# Patient Record
Sex: Female | Born: 1965
Health system: Southern US, Community
[De-identification: ages and names within clinical notes are randomized; demographics above are authoritative.]

## PROBLEM LIST (undated history)

## (undated) DIAGNOSIS — T7840XA Allergy, unspecified, initial encounter: Secondary | ICD-10-CM

## (undated) DIAGNOSIS — I10 Essential (primary) hypertension: Secondary | ICD-10-CM

## (undated) DIAGNOSIS — R87811 Vaginal high risk human papillomavirus (HPV) DNA test positive: Secondary | ICD-10-CM

## (undated) DIAGNOSIS — IMO0002 Reserved for concepts with insufficient information to code with codable children: Secondary | ICD-10-CM

## (undated) DIAGNOSIS — J45909 Unspecified asthma, uncomplicated: Secondary | ICD-10-CM

## (undated) DIAGNOSIS — R87620 Atypical squamous cells of undetermined significance on cytologic smear of vagina (ASC-US): Secondary | ICD-10-CM

## (undated) HISTORY — DX: Reserved for concepts with insufficient information to code with codable children: IMO0002

## (undated) HISTORY — DX: Essential (primary) hypertension: I10

## (undated) HISTORY — DX: Allergy, unspecified, initial encounter: T78.40XA

## (undated) HISTORY — DX: Atypical squamous cells of undetermined significance on cytologic smear of vagina (ASC-US): R87.620

## (undated) HISTORY — DX: Vaginal high risk human papillomavirus (HPV) DNA test positive: R87.811

## (undated) HISTORY — PX: OTHER SURGICAL HISTORY: SHX169

## (undated) HISTORY — DX: Unspecified asthma, uncomplicated: J45.909

---

## 1998-04-23 ENCOUNTER — Other Ambulatory Visit: Admission: RE | Admit: 1998-04-23 | Discharge: 1998-04-23 | Payer: Self-pay | Admitting: Obstetrics and Gynecology

## 1999-05-05 ENCOUNTER — Other Ambulatory Visit: Admission: RE | Admit: 1999-05-05 | Discharge: 1999-05-05 | Payer: Self-pay | Admitting: Gynecology

## 2000-05-04 ENCOUNTER — Other Ambulatory Visit: Admission: RE | Admit: 2000-05-04 | Discharge: 2000-05-04 | Payer: Self-pay | Admitting: Gynecology

## 2001-05-05 ENCOUNTER — Other Ambulatory Visit: Admission: RE | Admit: 2001-05-05 | Discharge: 2001-05-05 | Payer: Self-pay | Admitting: Gynecology

## 2002-05-09 ENCOUNTER — Other Ambulatory Visit: Admission: RE | Admit: 2002-05-09 | Discharge: 2002-05-09 | Payer: Self-pay | Admitting: Gynecology

## 2003-05-11 ENCOUNTER — Other Ambulatory Visit: Admission: RE | Admit: 2003-05-11 | Discharge: 2003-05-11 | Payer: Self-pay | Admitting: Gynecology

## 2004-05-26 ENCOUNTER — Other Ambulatory Visit: Admission: RE | Admit: 2004-05-26 | Discharge: 2004-05-26 | Payer: Self-pay | Admitting: Gynecology

## 2004-07-18 ENCOUNTER — Ambulatory Visit: Payer: Self-pay | Admitting: Internal Medicine

## 2005-01-15 ENCOUNTER — Ambulatory Visit: Payer: Self-pay | Admitting: Internal Medicine

## 2005-05-18 ENCOUNTER — Ambulatory Visit: Payer: Self-pay | Admitting: Internal Medicine

## 2005-05-27 ENCOUNTER — Other Ambulatory Visit: Admission: RE | Admit: 2005-05-27 | Discharge: 2005-05-27 | Payer: Self-pay | Admitting: Gynecology

## 2005-07-22 ENCOUNTER — Ambulatory Visit: Payer: Self-pay | Admitting: Internal Medicine

## 2005-09-10 ENCOUNTER — Ambulatory Visit: Payer: Self-pay | Admitting: Internal Medicine

## 2006-01-22 ENCOUNTER — Ambulatory Visit: Payer: Self-pay | Admitting: Internal Medicine

## 2006-05-31 ENCOUNTER — Other Ambulatory Visit: Admission: RE | Admit: 2006-05-31 | Discharge: 2006-05-31 | Payer: Self-pay | Admitting: Gynecology

## 2006-09-24 ENCOUNTER — Ambulatory Visit: Payer: Self-pay | Admitting: Internal Medicine

## 2006-11-02 ENCOUNTER — Ambulatory Visit: Payer: Self-pay | Admitting: Internal Medicine

## 2006-12-14 ENCOUNTER — Other Ambulatory Visit: Admission: RE | Admit: 2006-12-14 | Discharge: 2006-12-14 | Payer: Self-pay | Admitting: Gynecology

## 2007-05-24 DIAGNOSIS — I10 Essential (primary) hypertension: Secondary | ICD-10-CM | POA: Insufficient documentation

## 2007-06-06 ENCOUNTER — Other Ambulatory Visit: Admission: RE | Admit: 2007-06-06 | Discharge: 2007-06-06 | Payer: Self-pay | Admitting: Gynecology

## 2007-07-19 ENCOUNTER — Telehealth: Payer: Self-pay | Admitting: Internal Medicine

## 2007-09-12 ENCOUNTER — Telehealth: Payer: Self-pay | Admitting: Internal Medicine

## 2007-09-12 ENCOUNTER — Ambulatory Visit: Payer: Self-pay | Admitting: Internal Medicine

## 2007-09-12 DIAGNOSIS — J069 Acute upper respiratory infection, unspecified: Secondary | ICD-10-CM | POA: Insufficient documentation

## 2007-09-19 ENCOUNTER — Telehealth: Payer: Self-pay | Admitting: Internal Medicine

## 2007-09-19 ENCOUNTER — Ambulatory Visit: Payer: Self-pay | Admitting: Internal Medicine

## 2007-10-30 ENCOUNTER — Emergency Department (HOSPITAL_COMMUNITY): Admission: EM | Admit: 2007-10-30 | Discharge: 2007-10-30 | Payer: Self-pay | Admitting: Emergency Medicine

## 2008-01-09 ENCOUNTER — Ambulatory Visit: Payer: Self-pay | Admitting: Internal Medicine

## 2008-01-09 DIAGNOSIS — N760 Acute vaginitis: Secondary | ICD-10-CM | POA: Insufficient documentation

## 2008-05-14 ENCOUNTER — Ambulatory Visit: Payer: Self-pay | Admitting: Internal Medicine

## 2008-06-06 ENCOUNTER — Encounter: Payer: Self-pay | Admitting: Gynecology

## 2008-06-06 ENCOUNTER — Ambulatory Visit: Payer: Self-pay | Admitting: Gynecology

## 2008-06-06 ENCOUNTER — Other Ambulatory Visit: Admission: RE | Admit: 2008-06-06 | Discharge: 2008-06-06 | Payer: Self-pay | Admitting: Gynecology

## 2008-11-05 ENCOUNTER — Ambulatory Visit: Payer: Self-pay | Admitting: Internal Medicine

## 2008-12-07 ENCOUNTER — Ambulatory Visit: Payer: Self-pay | Admitting: Internal Medicine

## 2009-02-05 ENCOUNTER — Telehealth: Payer: Self-pay | Admitting: Internal Medicine

## 2009-02-07 ENCOUNTER — Ambulatory Visit: Payer: Self-pay | Admitting: Internal Medicine

## 2009-02-07 DIAGNOSIS — L723 Sebaceous cyst: Secondary | ICD-10-CM | POA: Insufficient documentation

## 2009-06-03 ENCOUNTER — Ambulatory Visit: Payer: Self-pay | Admitting: Internal Medicine

## 2009-06-03 DIAGNOSIS — J309 Allergic rhinitis, unspecified: Secondary | ICD-10-CM | POA: Insufficient documentation

## 2009-06-07 ENCOUNTER — Encounter: Payer: Self-pay | Admitting: Gynecology

## 2009-06-07 ENCOUNTER — Other Ambulatory Visit: Admission: RE | Admit: 2009-06-07 | Discharge: 2009-06-07 | Payer: Self-pay | Admitting: Gynecology

## 2009-06-07 ENCOUNTER — Ambulatory Visit: Payer: Self-pay | Admitting: Gynecology

## 2009-10-24 ENCOUNTER — Ambulatory Visit: Payer: Self-pay | Admitting: Internal Medicine

## 2009-11-29 ENCOUNTER — Ambulatory Visit: Payer: Self-pay | Admitting: Internal Medicine

## 2009-11-29 LAB — CONVERTED CEMR LAB
ALT: 17 units/L (ref 0–35)
BUN: 9 mg/dL (ref 6–23)
Bilirubin Urine: NEGATIVE
Bilirubin, Direct: 0 mg/dL (ref 0.0–0.3)
CO2: 30 meq/L (ref 19–32)
Chloride: 104 meq/L (ref 96–112)
Cholesterol: 237 mg/dL — ABNORMAL HIGH (ref 0–200)
Creatinine, Ser: 0.9 mg/dL (ref 0.4–1.2)
Direct LDL: 179.1 mg/dL
Eosinophils Absolute: 0.1 10*3/uL (ref 0.0–0.7)
Glucose, Bld: 83 mg/dL (ref 70–99)
Glucose, Urine, Semiquant: NEGATIVE
HCT: 35.7 % — ABNORMAL LOW (ref 36.0–46.0)
Lymphs Abs: 1.4 10*3/uL (ref 0.7–4.0)
MCHC: 34.2 g/dL (ref 30.0–36.0)
MCV: 90.1 fL (ref 78.0–100.0)
Monocytes Absolute: 0.4 10*3/uL (ref 0.1–1.0)
Neutrophils Relative %: 60.5 % (ref 43.0–77.0)
Platelets: 259 10*3/uL (ref 150.0–400.0)
Potassium: 3.7 meq/L (ref 3.5–5.1)
Protein, U semiquant: NEGATIVE
RDW: 13.7 % (ref 11.5–14.6)
Specific Gravity, Urine: 1.015
TSH: 2.04 microintl units/mL (ref 0.35–5.50)
Total Bilirubin: 0.3 mg/dL (ref 0.3–1.2)
VLDL: 16.4 mg/dL (ref 0.0–40.0)
WBC Urine, dipstick: NEGATIVE
pH: 7

## 2009-12-06 ENCOUNTER — Ambulatory Visit: Payer: Self-pay | Admitting: Internal Medicine

## 2009-12-06 DIAGNOSIS — E785 Hyperlipidemia, unspecified: Secondary | ICD-10-CM | POA: Insufficient documentation

## 2010-06-17 ENCOUNTER — Other Ambulatory Visit: Admission: RE | Admit: 2010-06-17 | Discharge: 2010-06-17 | Payer: Self-pay | Admitting: Gynecology

## 2010-06-17 ENCOUNTER — Ambulatory Visit: Payer: Self-pay | Admitting: Gynecology

## 2010-07-01 ENCOUNTER — Ambulatory Visit: Payer: Self-pay | Admitting: Internal Medicine

## 2010-07-28 ENCOUNTER — Telehealth: Payer: Self-pay | Admitting: Internal Medicine

## 2010-07-30 ENCOUNTER — Ambulatory Visit: Payer: Self-pay | Admitting: Internal Medicine

## 2010-07-30 DIAGNOSIS — K625 Hemorrhage of anus and rectum: Secondary | ICD-10-CM | POA: Insufficient documentation

## 2010-09-25 NOTE — Letter (Signed)
Summary: Out of Work  Adult nurse at [employer]  [address]   [place], [place] [address]   Phone: [phone]  Fax: [phone]    July 01, 2010   Employee:  Catherine Campbell    To Whom It May Concern:   For Medical reasons, please excuse the above named employee from work for the following dates:  Start:   06-29-10  End:   07-03-10 if improved  If you need additional information, please feel free to contact our office.         Sincerely,    Gordy Savers  MD

## 2010-09-25 NOTE — Assessment & Plan Note (Signed)
Summary: CPX/NO PAP/NJR   Vital Signs:  Patient profile:   45 year old female Height:      65 inches Weight:      212 pounds BMI:     35.41 Temp:     98.7 degrees F oral BP sitting:   120 / 70  (right arm) Cuff size:   regular  Vitals Entered By: Duard Brady LPN (December 06, 2009 3:10 PM) CC: cpx - labs done - pap and mammo 05/2009 both normal Is Patient Diabetic? No   CC:  cpx - labs done - pap and mammo 05/2009 both normal.  History of Present Illness: 45 year old patient who is in today for a  health maintenance examination.  she is followed by gynecology.  She is doing quite well today without concerns or complaints.  She has a history treated hypertension and allergic rhinitis.  For the past month or two.  She has had some residual cough following a URI.  She is on Altace  for blood pressure control  Allergies: 1)  ! Pcn 2)  ! * Ultracet 3)  ! * Skelaxin  Past History:  Past Medical History: Hypertension Allergic rhinitis Hyperlipidemia  Past Surgical History: none drainage L axillary adenitis   Family History: Reviewed history from 09/12/2007 and no changes required.  father died age 66  history hypertension, type 2 diabetes, throat Ca- died complication of pneumonia, colonic polyps mother history of hypertension  no siblings  No family history of cancer  Social History: Reviewed history from 09/12/2007 and no changes required.  graduate, [employer]  Review of Systems       The patient complains of prolonged cough.  The patient denies anorexia, fever, weight loss, weight gain, vision loss, decreased hearing, hoarseness, chest pain, syncope, dyspnea on exertion, peripheral edema, headaches, hemoptysis, abdominal pain, melena, hematochezia, severe indigestion/heartburn, hematuria, incontinence, genital sores, muscle weakness, suspicious skin lesions, transient blindness, difficulty walking, depression, unusual weight change, abnormal  bleeding, enlarged lymph nodes, angioedema, and breast masses.    Physical Exam  General:  overweight-appearing.  130/74overweight-appearing.   Head:  Normocephalic and atraumatic without obvious abnormalities. No apparent alopecia or balding. Eyes:  No corneal or conjunctival inflammation noted. EOMI. Perrla. Funduscopic exam benign, without hemorrhages, exudates or papilledema. Vision grossly normal. Ears:  External ear exam shows no significant lesions or deformities.  Otoscopic examination reveals clear canals, tympanic membranes are intact bilaterally without bulging, retraction, inflammation or discharge. Hearing is grossly normal bilaterally. Nose:  External nasal examination shows no deformity or inflammation. Nasal mucosa are pink and moist without lesions or exudates. Mouth:  Oral mucosa and oropharynx without lesions or exudates.  Teeth in good repair. Neck:  No deformities, masses, or tenderness noted. Chest Wall:  No deformities, masses, or tenderness noted. Lungs:  Normal respiratory effort, chest expands symmetrically. Lungs are clear to auscultation, no crackles or wheezes. Heart:  Normal rate and regular rhythm. S1 and S2 normal without gallop, murmur, click, rub or other extra sounds. Abdomen:  Bowel sounds positive,abdomen soft and non-tender without masses, organomegaly or hernias noted. Msk:  No deformity or scoliosis noted of thoracic or lumbar spine.   Pulses:  R and L carotid,radial,femoral,dorsalis pedis and posterior tibial pulses are full and equal bilaterally Extremities:  No clubbing, cyanosis, edema, or deformity noted with normal full range of motion of all joints.   Neurologic:  No cranial nerve deficits noted. Station and gait are normal. Plantar reflexes are down-going bilaterally. DTRs are symmetrical  throughout. Sensory, motor and coordinative functions appear intact. Skin:  Intact without suspicious lesions or rashes Cervical Nodes:  No lymphadenopathy  noted Axillary Nodes:  No palpable lymphadenopathy Inguinal Nodes:  No significant adenopathy Psych:  Cognition and judgment appear intact. Alert and cooperative with normal attention span and concentration. No apparent delusions, illusions, hallucinations   Impression & Recommendations:  Problem # 1:  Preventive Health Care (ICD-V70.0)  Complete Medication List: 1)  Altace 5 Mg Tabs (Ramipril) .Marland Kitchen.. 1 once daily 2)  Hydrocodone-homatropine 5-1.5 Mg/6ml Syrp (Hydrocodone-homatropine) .Marland Kitchen.. 1 teaspoon every 6 hours as needed for cough  Patient Instructions: 1)  Please schedule a follow-up appointment in 1 year. 2)  Limit your Sodium (Salt). 3)  It is important that you exercise regularly at least 20 minutes 5 times a week. If you develop chest pain, have severe difficulty breathing, or feel very tired , stop exercising immediately and seek medical attention. 4)  You need to lose weight. Consider a lower calorie diet and regular exercise.  5)  Check your Blood Pressure regularly. If it is above:150/90  you should make an appointment. Prescriptions: ALTACE 5 MG  TABS (RAMIPRIL) 1 once daily  #90 x 6   Entered and Authorized by:   Gordy Savers  MD   Signed by:   Gordy Savers  MD on 12/06/2009   Method used:   Electronically to        [facility] # [phone]* (retail)       [address] [place], [place]  [address]       Ph: [phone]       Fax: [phone]   RxID:   [id number]

## 2010-09-25 NOTE — Assessment & Plan Note (Signed)
Summary: SINUSITIS?, LG FEVER // RS   Vital Signs:  Patient profile:   45 year old female Weight:      211 pounds Temp:     98.7 degrees F oral BP sitting:   130 / 74  (right arm) Cuff size:   large  Vitals Entered By: Duard Brady LPN (October 24, 4780 11:24 AM) CC: c/o nasal congestion, dry cough , otc not helpings Is Patient Diabetic? No   CC:  c/o nasal congestion, dry cough , and otc not helpings.  History of Present Illness: 45 year old patient who is seen today with a week history of nasal congestion and cough.  Her chief complaint is hoarseness.  She has been using Claritin and some over-the-counter medicines without much benefit.  She has treated hypertension, which has not well controlled and also a history of allergic rhinitis.  Denies any fever or purulent, sputum production.  Denies any shortness of breath or chest pain  Preventive Screening-Counseling & Management  Alcohol-Tobacco     Smoking Status: never  Allergies: 1)  ! Pcn 2)  ! * Ultracet 3)  ! * Skelaxin  Past History:  Past Medical History: Reviewed history from 06/03/2009 and no changes required. Hypertension Allergic rhinitis  Review of Systems       The patient complains of anorexia, hoarseness, and prolonged cough.  The patient denies fever, weight loss, weight gain, vision loss, decreased hearing, chest pain, syncope, dyspnea on exertion, peripheral edema, headaches, hemoptysis, abdominal pain, melena, hematochezia, severe indigestion/heartburn, hematuria, incontinence, genital sores, muscle weakness, suspicious skin lesions, transient blindness, difficulty walking, depression, unusual weight change, abnormal bleeding, enlarged lymph nodes, angioedema, and breast masses.    Physical Exam  General:  overweight-appearing.  normal blood pressure, no distress Head:  Normocephalic and atraumatic without obvious abnormalities. No apparent alopecia or balding. Eyes:  No corneal or conjunctival  inflammation noted. EOMI. Perrla. Funduscopic exam benign, without hemorrhages, exudates or papilledema. Vision grossly normal. Ears:  External ear exam shows no significant lesions or deformities.  Otoscopic examination reveals clear canals, tympanic membranes are intact bilaterally without bulging, retraction, inflammation or discharge. Hearing is grossly normal bilaterally. Nose:  External nasal examination shows no deformity or inflammation. Nasal mucosa are pink and moist without lesions or exudates. Mouth:  Oral mucosa and oropharynx without lesions or exudates.  Teeth in good repair. Neck:  No deformities, masses, or tenderness noted. Lungs:  Normal respiratory effort, chest expands symmetrically. Lungs are clear to auscultation, no crackles or wheezes. Heart:  Normal rate and regular rhythm. S1 and S2 normal without gallop, murmur, click, rub or other extra sounds. Abdomen:  Bowel sounds positive,abdomen soft and non-tender without masses, organomegaly or hernias noted. Msk:  No deformity or scoliosis noted of thoracic or lumbar spine.   Pulses:  R and L carotid,radial,femoral,dorsalis pedis and posterior tibial pulses are full and equal bilaterally Extremities:  No clubbing, cyanosis, edema, or deformity noted with normal full range of motion of all joints.     Impression & Recommendations:  Problem # 1:  ALLERGIC RHINITIS (ICD-477.9)  Her updated medication list for this problem includes:    Clarinex 5 Mg Tabs (Desloratadine) ..... One by mouth daily    Omnaris 50 Mcg/act Susp (Ciclesonide) .Marland Kitchen... 2 sprays in each nostril once daily  Her updated medication list for this problem includes:    Clarinex 5 Mg Tabs (Desloratadine) ..... One by mouth daily    Omnaris 50 Mcg/act Susp (Ciclesonide) .Marland Kitchen... 2 sprays  in each nostril once daily  Problem # 2:  URI (ICD-465.9)  Her updated medication list for this problem includes:    Hydrocodone-homatropine 5-1.5 Mg/53ml Syrp  (Hydrocodone-homatropine) .Marland Kitchen... 1 teaspoon every 6 hours as needed for cough    Clarinex 5 Mg Tabs (Desloratadine) ..... One by mouth daily  Her updated medication list for this problem includes:    Hydrocodone-homatropine 5-1.5 Mg/57ml Syrp (Hydrocodone-homatropine) .Marland Kitchen... 1 teaspoon every 6 hours as needed for cough    Clarinex 5 Mg Tabs (Desloratadine) ..... One by mouth daily  Problem # 3:  HYPERTENSION (ICD-401.9)  Her updated medication list for this problem includes:    Altace 5 Mg Tabs (Ramipril) .Marland Kitchen... 1 once daily  Her updated medication list for this problem includes:    Altace 5 Mg Tabs (Ramipril) .Marland Kitchen... 1 once daily  Complete Medication List: 1)  Altace 5 Mg Tabs (Ramipril) .Marland Kitchen.. 1 once daily 2)  Hydrocodone-homatropine 5-1.5 Mg/87ml Syrp (Hydrocodone-homatropine) .Marland Kitchen.. 1 teaspoon every 6 hours as needed for cough 3)  Clarinex 5 Mg Tabs (Desloratadine) .... One by mouth daily 4)  Omnaris 50 Mcg/act Susp (Ciclesonide) .... 2 sprays in each nostril once daily  Patient Instructions: 1)  Get plenty of rest, drink lots of clear liquids, and use Tylenol or Ibuprofen for fever and comfort. Return in 7-10 days if you're not better:sooner if you're feeling worse. 2)  arthrotec one twice daily  3)  Limit your Sodium (Salt) to less than 2 grams a day(slightly less than 1/2 a teaspoon) to prevent fluid retention, swelling, or worsening of symptoms. 4)  It is important that you exercise regularly at least 20 minutes 5 times a week. If you develop chest pain, have severe difficulty breathing, or feel very tired , stop exercising immediately and seek medical attention. 5)  You need to lose weight. Consider a lower calorie diet and regular exercise.  Prescriptions: HYDROCODONE-HOMATROPINE 5-1.5 MG/5ML SYRP (HYDROCODONE-HOMATROPINE) 1 teaspoon every 6 hours as needed for cough  #6 oz x 2   Entered and Authorized by:   Gordy Savers  MD   Signed by:   Gordy Savers  MD on 10/24/2009    Method used:   Print then Give to Patient   RxID:   [id number]

## 2010-09-25 NOTE — Assessment & Plan Note (Signed)
Summary: bleeding from bowels/dm   Vital Signs:  Patient profile:   45 year old female Weight:      213 pounds Temp:     98.4 degrees F oral BP sitting:   126 / 78  (left arm) Cuff size:   regular  Vitals Entered By: Duard Brady LPN (July 30, 2010 10:33 AM) CC: c/o blood noted with Bms while using vivimo Is Patient Diabetic? No   CC:  c/o blood noted with Bms while using vivimo.  History of Present Illness: 45 year old patient who is seen today for follow-up.  She was seen last month for URI and was treated briefly with Vimovo.  In the past few days.  She has had some low-volume bright red rectal bleeding with her normal daily bowel movement only.  This morning, however, was her first BM without any significant blood.  No significant pain although has noted some mild discomfort with her daily bowel movement  Allergies: 1)  ! Pcn 2)  ! * Ultracet 3)  ! * Skelaxin  Past History:  Past Medical History: Reviewed history from 12/06/2009 and no changes required. Hypertension Allergic rhinitis Hyperlipidemia  Review of Systems       The patient complains of hematochezia.  The patient denies anorexia, fever, weight loss, weight gain, vision loss, decreased hearing, hoarseness, chest pain, syncope, dyspnea on exertion, peripheral edema, prolonged cough, headaches, hemoptysis, abdominal pain, melena, severe indigestion/heartburn, hematuria, incontinence, genital sores, muscle weakness, suspicious skin lesions, transient blindness, difficulty walking, depression, unusual weight change, abnormal bleeding, enlarged lymph nodes, angioedema, and breast masses.    Physical Exam  General:  Well-developed,well-nourished,in no acute distress; alert,appropriate and cooperative throughout examination Abdomen:  Bowel sounds positive,abdomen soft and non-tender without masses, organomegaly or hernias noted. Rectal:  smaller hemorrhoidal tag at the clock position.  Internal exam normal;   stool is negative for occult blood   Impression & Recommendations:  Problem # 1:  RECTAL BLEEDING (ICD-569.3)  Complete Medication List: 1)  Altace 5 Mg Tabs (Ramipril) .Marland Kitchen.. 1 once daily 2)  Hydrocodone-homatropine 5-1.5 Mg/37ml Syrp (Hydrocodone-homatropine) .Marland Kitchen.. 1 teaspoon every 6 hours as needed for cough 3)  Proctocare-hc 2.5 % Crea (Hydrocortisone) .... Apply every 6 hours as needed  Patient Instructions: 1)  Please schedule a follow-up appointment as needed. 2)  call if bleeding recurs or intensifies Prescriptions: PROCTOCARE-HC 2.5 % CREA (HYDROCORTISONE) apply every 6 hours as needed  #30 gm x 3   Entered and Authorized by:   Gordy Savers  MD   Signed by:   Gordy Savers  MD on 07/30/2010   Method used:   Print then Give to Patient   RxID:   [id number]    Orders Added: 1)  Est. Patient Level III [[address]]

## 2010-09-25 NOTE — Assessment & Plan Note (Signed)
Summary: COUGH, CONGESTION - LARYNGITIS // RS   Vital Signs:  Patient profile:   45 year old female Weight:      212 pounds Temp:     99.2 degrees F oral BP sitting:   130 / 82  (right arm) Cuff size:   regular  Vitals Entered By: Duard Brady LPN (July 01, 2010 9:08 AM) CC: c/o dry,tight cough x 2 wks , hoarseness Is Patient Diabetic? No   CC:  c/o dry, tight cough x 2 wks , and hoarseness.  History of Present Illness: 45 year old patient, who presents with a 4-day history of nonproductive cough.  She has had low-grade fever and hoarseness.  Denies any chest pain or shortness of breath.  She does have a history of treated hypertension on ACE inhibition.  No history of prolonged cough.  Denies any sputum production.  She has a history of mild dyslipidemia with a high HDL controlled with diet  Allergies: 1)  ! Pcn 2)  ! * Ultracet 3)  ! * Skelaxin  Past History:  Past Medical History: Reviewed history from 12/06/2009 and no changes required. Hypertension Allergic rhinitis Hyperlipidemia  Family History: Reviewed history from 12/06/2009 and no changes required.  father died age 79  history hypertension, type 2 diabetes, throat Ca- died complication of pneumonia, colonic polyps mother history of hypertension  no siblings  No family history of cancer  Review of Systems       The patient complains of anorexia, fever, hoarseness, and prolonged cough.  The patient denies weight loss, weight gain, vision loss, decreased hearing, chest pain, syncope, dyspnea on exertion, peripheral edema, headaches, hemoptysis, abdominal pain, melena, hematochezia, severe indigestion/heartburn, hematuria, incontinence, genital sores, muscle weakness, suspicious skin lesions, transient blindness, difficulty walking, depression, unusual weight change, abnormal bleeding, enlarged lymph nodes, angioedema, and breast masses.    Physical Exam  General:  overweight-appearing. 140/90, lower  on arrival; courseoverweight-appearing.   Head:  Normocephalic and atraumatic without obvious abnormalities. No apparent alopecia or balding. Eyes:  No corneal or conjunctival inflammation noted. EOMI. Perrla. Funduscopic exam benign, without hemorrhages, exudates or papilledema. Vision grossly normal. Ears:  External ear exam shows no significant lesions or deformities.  Otoscopic examination reveals clear canals, tympanic membranes are intact bilaterally without bulging, retraction, inflammation or discharge. Hearing is grossly normal bilaterally. Nose:  External nasal examination shows no deformity or inflammation. Nasal mucosa are pink and moist without lesions or exudates. Mouth:  Oral mucosa and oropharynx without lesions or exudates.  Teeth in good repair. Neck:  No deformities, masses, or tenderness noted. Chest Wall:  No deformities, masses, or tenderness noted. Lungs:  Normal respiratory effort, chest expands symmetrically. Lungs are clear to auscultation, no crackles or wheezes.   Impression & Recommendations:  Problem # 1:  URI (ICD-465.9)  Her updated medication list for this problem includes:    Hydrocodone-homatropine 5-1.5 Mg/39ml Syrp (Hydrocodone-homatropine) .Marland Kitchen... 1 teaspoon every 6 hours as needed for cough  Her updated medication list for this problem includes:    Hydrocodone-homatropine 5-1.5 Mg/79ml Syrp (Hydrocodone-homatropine) .Marland Kitchen... 1 teaspoon every 6 hours as needed for cough  Problem # 2:  HYPERTENSION (ICD-401.9)  Her updated medication list for this problem includes:    Altace 5 Mg Tabs (Ramipril) .Marland Kitchen... 1 once daily  Her updated medication list for this problem includes:    Altace 5 Mg Tabs (Ramipril) .Marland Kitchen... 1 once daily  Complete Medication List: 1)  Altace 5 Mg Tabs (Ramipril) .Marland Kitchen.. 1 once daily 2)  Hydrocodone-homatropine 5-1.5 Mg/60ml Syrp (Hydrocodone-homatropine) .Marland Kitchen.. 1 teaspoon every 6 hours as needed for cough  Patient Instructions: 1)  Get plenty  of rest, drink lots of clear liquids, and use Tylenol  for fever and comfort. Return in 7-10 days if you're not better:sooner if you're feeling worse. 2)  Vimovo- one twice daily 3)  Substitue benicar for altace 4)  Add Delsym or Mucinex DM for cough 5)  Please schedule a follow-up appointment in 6 months for Annual exam 6)  Limit your Sodium (Salt). Prescriptions: HYDROCODONE-HOMATROPINE 5-1.5 MG/5ML SYRP (HYDROCODONE-HOMATROPINE) 1 teaspoon every 6 hours as needed for cough  #6 oz x 2   Entered and Authorized by:   Gordy Savers  MD   Signed by:   Gordy Savers  MD on 07/01/2010   Method used:   Print then Give to Patient   RxID:   [id number] ALTACE 5 MG  TABS (RAMIPRIL) 1 once daily  #90 x 6   Entered and Authorized by:   Gordy Savers  MD   Signed by:   Gordy Savers  MD on 07/01/2010   Method used:   Print then Give to Patient   RxID:   [id number]    Orders Added: 1)  Est. Patient Level III [84696]

## 2010-09-25 NOTE — Progress Notes (Signed)
Summary: blood in stool  Phone Note Call from Patient Call back at Work Phone [phone]   Caller: Patient Call For: Gordy Savers  MD Summary of Call: Vimovo has caused pt to have blood in her stool?????  No pain, but did not start until she started this med. Initial call taken by: Lynann Beaver CMA AAMA,  July 28, 2010 11:19 AM  Follow-up for Phone Call        d/c vimovo- melena?   [facility] ?  needs rov unless this is scanty [facility] Follow-up by: Gordy Savers  MD,  July 28, 2010 12:24 PM  Additional Follow-up for Phone Call Additional follow up Details #1::        [facility] Additional Follow-up by: [facility] CMA AAMA,  July 28, 2010 12:29 PM    Additional Follow-up for Phone Call Additional follow up Details #2::    Made appt for tomorrow. Follow-up by: Lynann Beaver CMA AAMA,  July 28, 2010 1:45 PM

## 2010-10-17 ENCOUNTER — Encounter: Payer: Self-pay | Admitting: Internal Medicine

## 2010-10-17 ENCOUNTER — Ambulatory Visit (INDEPENDENT_AMBULATORY_CARE_PROVIDER_SITE_OTHER): Payer: Federal, State, Local not specified - PPO | Admitting: Internal Medicine

## 2010-10-17 DIAGNOSIS — J040 Acute laryngitis: Secondary | ICD-10-CM

## 2010-10-17 MED ORDER — AZITHROMYCIN 250 MG PO TABS: ORAL_TABLET | ORAL | Status: AC

## 2010-10-17 NOTE — Assessment & Plan Note (Signed)
Suspect current viral etiology. Recommend otc sx tx prn and salt water gargles. Provide abx prescription to hold. Begin abx if sx's do not improve after total duration of at least 8-10 days. Followup if no improvement or worsening.

## 2010-10-17 NOTE — Progress Notes (Signed)
Subjective:    Patient ID: Catherine Campbell, female    DOB: 12-24-1965, 45 y.o.   MRN: [id number]  HPI Pt presents to clinic for evaluation of laryngitis. Notes 4d h/o nasal congestion, nasal drainage, mild cough and laryngitis. Attempted otc cold medication without improvement. No alleviating or exacerbating factors. Notes have suffered from 5-6 episodes of laryngitis since November all seemingly associated with URI's.  Reviewed PMH, medications, and allergies.    Review of Systems  Constitutional: Negative for fever and chills.  HENT: Positive for congestion and rhinorrhea. Negative for ear pain.   Eyes: Negative for discharge and redness.  Respiratory: Positive for cough. Negative for shortness of breath and wheezing.   Skin: Negative for color change and rash.       Objective:   Physical Exam  Nursing note and vitals reviewed. Constitutional: She appears well-developed and well-nourished.  HENT:  Head: Normocephalic and atraumatic.  Right Ear: Tympanic membrane, external ear and ear canal normal.  Left Ear: Tympanic membrane, external ear and ear canal normal.  Nose: Nose normal.  Mouth/Throat: Oropharynx is clear and moist. No oropharyngeal exudate.  Eyes: Conjunctivae are normal. Right eye exhibits no discharge. Left eye exhibits no discharge. No scleral icterus.  Neck: Neck supple.  Cardiovascular: Normal rate, regular rhythm and normal heart sounds.   Pulmonary/Chest: Effort normal and breath sounds normal. No respiratory distress. She has no wheezes. She has no rales.  Lymphadenopathy:    She has no cervical adenopathy.  Neurological: She is alert.  Skin: Skin is warm and dry.          Assessment & Plan:

## 2010-10-20 ENCOUNTER — Telehealth: Payer: Self-pay | Admitting: *Deleted

## 2010-10-20 DIAGNOSIS — J040 Acute laryngitis: Secondary | ICD-10-CM

## 2010-10-20 NOTE — Telephone Encounter (Signed)
Okay to refer her to allergy 

## 2010-10-20 NOTE — Telephone Encounter (Signed)
Okay to refer her to allergy

## 2010-10-20 NOTE — Telephone Encounter (Signed)
Mother is asking for referral for laryngitis for daughter, please.

## 2010-10-21 NOTE — Telephone Encounter (Signed)
Addended by: Duard Brady on: 10/21/2010 12:39 PM   Modules accepted: Orders

## 2010-10-21 NOTE — Telephone Encounter (Signed)
Attempt to call - ans mach - left msg that terri will be sceduling appt with allergist and will call once that is set up. Order done. KIK

## 2011-05-18 LAB — RAPID STREP SCREEN (MED CTR MEBANE ONLY): Streptococcus, Group A Screen (Direct): NEGATIVE

## 2011-06-19 ENCOUNTER — Encounter: Payer: Federal, State, Local not specified - PPO | Admitting: Gynecology

## 2011-07-03 ENCOUNTER — Ambulatory Visit (INDEPENDENT_AMBULATORY_CARE_PROVIDER_SITE_OTHER): Payer: Federal, State, Local not specified - PPO | Admitting: Gynecology

## 2011-07-03 ENCOUNTER — Encounter: Payer: Self-pay | Admitting: Gynecology

## 2011-07-03 VITALS — BP 124/78 | Ht 65.5 in | Wt 209.0 lb

## 2011-07-03 DIAGNOSIS — B3731 Acute candidiasis of vulva and vagina: Secondary | ICD-10-CM

## 2011-07-03 DIAGNOSIS — N760 Acute vaginitis: Secondary | ICD-10-CM

## 2011-07-03 DIAGNOSIS — M545 Low back pain, unspecified: Secondary | ICD-10-CM

## 2011-07-03 DIAGNOSIS — B373 Candidiasis of vulva and vagina: Secondary | ICD-10-CM

## 2011-07-03 DIAGNOSIS — A499 Bacterial infection, unspecified: Secondary | ICD-10-CM

## 2011-07-03 DIAGNOSIS — R82998 Other abnormal findings in urine: Secondary | ICD-10-CM

## 2011-07-03 DIAGNOSIS — Z01419 Encounter for gynecological examination (general) (routine) without abnormal findings: Secondary | ICD-10-CM

## 2011-07-03 DIAGNOSIS — B9689 Other specified bacterial agents as the cause of diseases classified elsewhere: Secondary | ICD-10-CM

## 2011-07-03 MED ORDER — TERCONAZOLE 0.8 % VA CREA: 1.0000 | TOPICAL_CREAM | Freq: Every day | VAGINAL | Status: AC

## 2011-07-03 MED ORDER — METRONIDAZOLE 500 MG PO TABS: 500.0000 mg | ORAL_TABLET | Freq: Two times a day (BID) | ORAL | Status: AC

## 2011-07-03 NOTE — Progress Notes (Signed)
Catherine Campbell 12/09/1965 [id number]        45 y.o.  for annual exam.  Overall doing well. She did asked to have a wet prep check for yeast and she's having a little bit of discharge and itching.  Past medical history,surgical history, medications, allergies, family history and social history were all reviewed and documented in the EPIC chart. ROS:  Was performed and pertinent positives and negatives are included in the history.  Exam: chaperone present Filed Vitals:   07/03/11 1458  BP: 124/78   General appearance  Normal Skin grossly normal Head/Neck normal with no cervical or supraclavicular adenopathy thyroid normal Lungs  clear Cardiac RR, without RMG Abdominal  soft, nontender, without masses, organomegaly or hernia Breasts  examined lying and sitting without masses, retractions, discharge or axillary adenopathy. Pelvic  Ext/BUS/vagina  normal with slight white discharge KOH were prep done  Cervix  normal    Uterus  retroverted, normal size, shape and contour, midline and mobile nontender   Adnexa  Without masses or tenderness    Anus and perineum  normal   Rectovaginal  normal sphincter tone without palpated masses or tenderness.    Assessment/Plan:  45 y.o. female for annual exam.    1. White discharge. Wet prep is positive for yeast and BV.  We'll treat with Terazol 3 day cream and Flagyl 500 twice a day x7 days supple avoidance discussed follow up if symptoms persist or recur. UA is low-grade positive will wait for culture results and treat accordingly. 2. Health maintenance. SBE monthly reviewed. Has mammogram scheduled later this month she will follow up for this. Pap was not done. She has no history of abnormal Pap smears in the past and documented normal results in her chart. We'll plan every 3 year screen she agrees with this. No blood work was done today as it is all done through her primary who follows her for her hypertension. Contraception is not an issue as she is  not sexually active and does not anticipate to be any time soon. Assuming patient continues well from a gynecologic standpoint she will see me in a year sooner as needed.    [name], 4:17 PM 07/03/2011

## 2011-07-06 MED ORDER — SULFAMETHOXAZOLE-TMP DS 800-160 MG PO TABS: 1.0000 | ORAL_TABLET | Freq: Two times a day (BID) | ORAL | Status: AC

## 2011-07-06 NOTE — Progress Notes (Signed)
Addended by: Dara Lords on: 07/06/2011 02:06 PM   Modules accepted: Orders

## 2011-07-20 ENCOUNTER — Encounter: Payer: Self-pay | Admitting: Gynecology

## 2011-08-07 ENCOUNTER — Ambulatory Visit (INDEPENDENT_AMBULATORY_CARE_PROVIDER_SITE_OTHER): Payer: Federal, State, Local not specified - PPO | Admitting: Internal Medicine

## 2011-08-07 ENCOUNTER — Encounter: Payer: Self-pay | Admitting: Internal Medicine

## 2011-08-07 DIAGNOSIS — I10 Essential (primary) hypertension: Secondary | ICD-10-CM

## 2011-08-07 MED ORDER — RAMIPRIL 5 MG PO CAPS: 5.0000 mg | ORAL_CAPSULE | Freq: Every day | ORAL | Status: DC

## 2011-08-07 NOTE — Progress Notes (Signed)
Subjective:    Patient ID: Catherine Campbell, female    DOB: 05-Aug-1966, 45 y.o.   MRN: [id number]  HPI  Wt Readings from Last 3 Encounters:  08/07/11 211 lb (95.709 kg)  07/03/11 209 lb (94.802 kg)  10/17/10 213 lb (96.616 kg)      Review of Systems     Objective:   Physical Exam        Assessment & Plan:

## 2011-08-07 NOTE — Patient Instructions (Signed)
Limit your sodium (Salt) intake    It is important that you exercise regularly, at least 20 minutes 3 to 4 times per week.  If you develop chest pain or shortness of breath seek  medical attention.  You need to lose weight.  Consider a lower calorie diet and regular exercise.  Return in one year for follow-up 

## 2011-08-07 NOTE — Progress Notes (Signed)
Subjective:    Patient ID: Catherine Campbell, female    DOB: [birth year], 45 y.o.   MRN: [id number]  HPI  is a 45 year old patient who is in today for followup of her hypertension. She has been controlled on Altace. She has had a recent gynecologic exam her blood pressure was normal at that encounter. She feels well today without concerns or complaints. She has a history mild exogenous obesity.    Review of Systems  Constitutional: Negative.   HENT: Negative for hearing loss, congestion, sore throat, rhinorrhea, dental problem, sinus pressure and tinnitus.   Eyes: Negative for pain, discharge and visual disturbance.  Respiratory: Negative for cough and shortness of breath.   Cardiovascular: Negative for chest pain, palpitations and leg swelling.  Gastrointestinal: Negative for nausea, vomiting, abdominal pain, diarrhea, constipation, blood in stool and abdominal distention.  Genitourinary: Negative for dysuria, urgency, frequency, hematuria, flank pain, vaginal bleeding, vaginal discharge, difficulty urinating, vaginal pain and pelvic pain.  Musculoskeletal: Negative for joint swelling, arthralgias and gait problem.  Skin: Negative for rash.  Neurological: Negative for dizziness, syncope, speech difficulty, weakness, numbness and headaches.  Hematological: Negative for adenopathy.  Psychiatric/Behavioral: Negative for behavioral problems, dysphoric mood and agitation. The patient is not nervous/anxious.        Objective:   Physical Exam  Constitutional: She is oriented to person, place, and time. She appears well-developed and well-nourished.  HENT:  Head: Normocephalic.  Right Ear: External ear normal.  Left Ear: External ear normal.  Mouth/Throat: Oropharynx is clear and moist.  Eyes: Conjunctivae and EOM are normal. Pupils are equal, round, and reactive to light.  Neck: Normal range of motion. Neck supple. No thyromegaly present.  Cardiovascular: Normal rate, regular rhythm, normal  heart sounds and intact distal pulses.   Pulmonary/Chest: Effort normal and breath sounds normal.  Abdominal: Soft. Bowel sounds are normal. She exhibits no mass. There is no tenderness.  Musculoskeletal: Normal range of motion.  Lymphadenopathy:    She has no cervical adenopathy.  Neurological: She is alert and oriented to person, place, and time.  Skin: Skin is warm and dry. No rash noted.  Psychiatric: She has a normal mood and affect. Her behavior is normal.          Assessment & Plan:    Hypertension. Well controlled we'll continue low-salt diet exercise regimen and weight loss all encouraged. Medications refilled. We'll see in one year.

## 2011-08-26 ENCOUNTER — Other Ambulatory Visit: Payer: Self-pay | Admitting: Internal Medicine

## 2011-09-29 ENCOUNTER — Encounter: Payer: Self-pay | Admitting: Internal Medicine

## 2011-09-29 ENCOUNTER — Ambulatory Visit (INDEPENDENT_AMBULATORY_CARE_PROVIDER_SITE_OTHER): Payer: Federal, State, Local not specified - PPO | Admitting: Internal Medicine

## 2011-09-29 DIAGNOSIS — I10 Essential (primary) hypertension: Secondary | ICD-10-CM

## 2011-09-29 DIAGNOSIS — J309 Allergic rhinitis, unspecified: Secondary | ICD-10-CM

## 2011-09-29 DIAGNOSIS — J069 Acute upper respiratory infection, unspecified: Secondary | ICD-10-CM

## 2011-09-29 MED ORDER — HYDROCODONE-HOMATROPINE 5-1.5 MG/5ML PO SYRP: 5.0000 mL | ORAL_SOLUTION | Freq: Four times a day (QID) | ORAL | Status: AC | PRN

## 2011-09-29 NOTE — Patient Instructions (Signed)
Get plenty of rest, Drink lots of  clear liquids, and use Tylenol or ibuprofen for fever and discomfort.    Call or return to clinic prn if these symptoms worsen or fail to improve as anticipated.  

## 2011-09-29 NOTE — Progress Notes (Signed)
Subjective:    Patient ID: Catherine Campbell, female    DOB: 1965/09/29, 46 y.o.   MRN: [id number]  HPI  is a 46 year old patient who has a history of allergic rhinitis is followed by allergy. In the past 7 days she's had increasing cough sinus and chest congestion hoarseness and minimal productive cough. There's been no active wheezing. She describes intermittent low-grade fever she has been using Allegra nasal steroids and nasal irrigation.    Review of Systems  Constitutional: Positive for activity change, appetite change and fatigue.  HENT: Positive for congestion, rhinorrhea and sinus pressure. Negative for hearing loss, sore throat, dental problem and tinnitus.   Eyes: Negative for pain, discharge and visual disturbance.  Respiratory: Positive for cough. Negative for shortness of breath.   Cardiovascular: Negative for chest pain, palpitations and leg swelling.  Gastrointestinal: Negative for nausea, vomiting, abdominal pain, diarrhea, constipation, blood in stool and abdominal distention.  Genitourinary: Negative for dysuria, urgency, frequency, hematuria, flank pain, vaginal bleeding, vaginal discharge, difficulty urinating, vaginal pain and pelvic pain.  Musculoskeletal: Negative for joint swelling, arthralgias and gait problem.  Skin: Negative for rash.  Neurological: Negative for dizziness, syncope, speech difficulty, weakness, numbness and headaches.  Hematological: Negative for adenopathy.  Psychiatric/Behavioral: Negative for behavioral problems, dysphoric mood and agitation. The patient is not nervous/anxious.        Objective:   Physical Exam  Constitutional: She is oriented to person, place, and time. She appears well-developed and well-nourished.  HENT:  Head: Normocephalic.  Right Ear: External ear normal.  Left Ear: External ear normal.  Mouth/Throat: Oropharynx is clear and moist.  Eyes: Conjunctivae and EOM are normal. Pupils are equal, round, and reactive to light.   Neck: Normal range of motion. Neck supple. No thyromegaly present.  Cardiovascular: Normal rate, regular rhythm, normal heart sounds and intact distal pulses.   Pulmonary/Chest: Effort normal and breath sounds normal.  Abdominal: Soft. Bowel sounds are normal. She exhibits no mass. There is no tenderness.  Musculoskeletal: Normal range of motion.  Lymphadenopathy:    She has no cervical adenopathy.  Neurological: She is alert and oriented to person, place, and time.  Skin: Skin is warm and dry. No rash noted.  Psychiatric: She has a normal mood and affect. Her behavior is normal.          Assessment & Plan:   Allergic rhinitis/URI. Will treat symptomatically Hypertension stable we'll continue Altace.

## 2011-10-28 ENCOUNTER — Other Ambulatory Visit: Payer: Self-pay | Admitting: Gynecology

## 2011-11-06 ENCOUNTER — Other Ambulatory Visit: Payer: Self-pay

## 2011-11-06 DIAGNOSIS — Z139 Encounter for screening, unspecified: Secondary | ICD-10-CM

## 2011-11-10 ENCOUNTER — Inpatient Hospital Stay: Admission: RE | Admit: 2011-11-10 | Payer: Federal, State, Local not specified - PPO | Source: Ambulatory Visit

## 2012-02-04 ENCOUNTER — Ambulatory Visit (INDEPENDENT_AMBULATORY_CARE_PROVIDER_SITE_OTHER): Payer: Federal, State, Local not specified - PPO | Admitting: Internal Medicine

## 2012-02-04 ENCOUNTER — Encounter: Payer: Self-pay | Admitting: Internal Medicine

## 2012-02-04 VITALS — BP 118/72 | HR 68 | Temp 98.2°F | Ht 65.0 in | Wt 218.0 lb

## 2012-02-04 DIAGNOSIS — I10 Essential (primary) hypertension: Secondary | ICD-10-CM

## 2012-02-04 DIAGNOSIS — R51 Headache: Secondary | ICD-10-CM

## 2012-02-04 DIAGNOSIS — J309 Allergic rhinitis, unspecified: Secondary | ICD-10-CM

## 2012-02-04 NOTE — Progress Notes (Signed)
Subjective:    Patient ID: Catherine Campbell, female    DOB: Aug 08, 1966, 46 y.o.   MRN: [id number]  HPI  46 year old patient who has a history of treated hypertension as well as allergic rhinitis. The latter is being treated aggressively including immunotherapy. She presents with a chief complaint of excess of work related stress associated with headaches and insomnia. She is unable to take a vacation time for the next 5 or 6 weeks. 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    Review of Systems  Constitutional: Negative.   HENT: Negative for hearing loss, congestion, sore throat, rhinorrhea, dental problem, sinus pressure and tinnitus.   Eyes: Negative for pain, discharge and visual disturbance.  Respiratory: Negative for cough and shortness of breath.   Cardiovascular: Negative for chest pain, palpitations and leg swelling.  Gastrointestinal: Negative for nausea, vomiting, abdominal pain, diarrhea, constipation, blood in stool and abdominal distention.  Genitourinary: Negative for dysuria, urgency, frequency, hematuria, flank pain, vaginal bleeding, vaginal discharge, difficulty urinating, vaginal pain and pelvic pain.  Musculoskeletal: Negative for joint swelling, arthralgias and gait problem.  Skin: Negative for rash.  Neurological: Negative for dizziness, syncope, speech difficulty, weakness, numbness and headaches.  Hematological: Negative for adenopathy.  Psychiatric/Behavioral: Positive for disturbed wake/sleep cycle, dysphoric mood and decreased concentration. Negative for behavioral problems and agitation. The patient is nervous/anxious.        Objective:   Physical Exam  Constitutional: She is oriented to person, place, and time. She appears well-developed and well-nourished.       Blood pressure well controlled  HENT:  Head: Normocephalic.  Right Ear: External ear normal.  Left Ear: External ear normal.  Mouth/Throat: Oropharynx is clear and  moist.  Eyes: Conjunctivae and EOM are normal. Pupils are equal, round, and reactive to light.  Neck: Normal range of motion. Neck supple. No thyromegaly present.  Cardiovascular: Normal rate, regular rhythm, normal heart sounds and intact distal pulses.   Pulmonary/Chest: Effort normal and breath sounds normal.  Abdominal: Soft. Bowel sounds are normal. She exhibits no mass. There is no tenderness.  Musculoskeletal: Normal range of motion.  Lymphadenopathy:    She has no cervical adenopathy.  Neurological: She is alert and oriented to person, place, and time.  Skin: Skin is warm and dry. No rash noted.  Psychiatric: She has a normal mood and affect. Her behavior is normal.          Assessment & Plan:   Situational stress associated with headaches and poor sleep habits Hypertension well controlled Allergic rhinitis  Have suggested a long weekend and a brief leave of absence from work

## 2012-02-04 NOTE — Patient Instructions (Signed)
Limit your sodium (Salt) intake    It is important that you exercise regularly, at least 20 minutes 3 to 4 times per week.  If you develop chest pain or shortness of breath seek  medical attention.   Please check your blood pressure on a regular basis.  If it is consistently greater than 150/90, please make an office appointment.   Call or return to clinic prn if these symptoms worsen or fail to improve as anticipated.  

## 2012-02-29 ENCOUNTER — Encounter: Payer: Self-pay | Admitting: *Deleted

## 2012-02-29 NOTE — Progress Notes (Signed)
Patient ID: Catherine Campbell, female   DOB: 01/26/1966, 46 y.o.   MRN: [id number] Pt left message in voicemail requesting birth control called into pharmacy. Pt last ov was in nov 2012, left message for pt to make OV.

## 2012-07-04 ENCOUNTER — Ambulatory Visit (INDEPENDENT_AMBULATORY_CARE_PROVIDER_SITE_OTHER): Payer: Federal, State, Local not specified - PPO | Admitting: Gynecology

## 2012-07-04 ENCOUNTER — Encounter: Payer: Self-pay | Admitting: Gynecology

## 2012-07-04 VITALS — BP 118/76 | Ht 65.0 in | Wt 209.0 lb

## 2012-07-04 DIAGNOSIS — L293 Anogenital pruritus, unspecified: Secondary | ICD-10-CM

## 2012-07-04 DIAGNOSIS — N898 Other specified noninflammatory disorders of vagina: Secondary | ICD-10-CM

## 2012-07-04 DIAGNOSIS — Z01419 Encounter for gynecological examination (general) (routine) without abnormal findings: Secondary | ICD-10-CM

## 2012-07-04 LAB — WET PREP FOR TRICH, YEAST, CLUE
WBC, Wet Prep HPF POC: NONE SEEN
Yeast Wet Prep HPF POC: NONE SEEN

## 2012-07-04 MED ORDER — METRONIDAZOLE 500 MG PO TABS: 500.0000 mg | ORAL_TABLET | Freq: Two times a day (BID) | ORAL | Status: DC

## 2012-07-04 MED ORDER — FLUCONAZOLE 150 MG PO TABS: 150.0000 mg | ORAL_TABLET | Freq: Once | ORAL | Status: DC

## 2012-07-04 NOTE — Patient Instructions (Addendum)
Take flagyl twice daily for one week and diflucan pill once. Follow up in one year for annual exam

## 2012-07-04 NOTE — Progress Notes (Signed)
Catherine Campbell 1965/09/22 [id number]        46 y.o.  G0P0 for annual exam.    Past medical history,surgical history, medications, allergies, family history and social history were all reviewed and documented in the EPIC chart. ROS:  Was performed and pertinent positives and negatives are included in the history.  Exam: Charity fundraiser Vitals:   07/04/12 1400  BP: 118/76  Height: 5\' 5"  (1.651 m)  Weight: 209 lb (94.802 kg)   General appearance  Normal Skin grossly normal Head/Neck normal with no cervical or supraclavicular adenopathy thyroid normal Lungs  clear Cardiac RR, without RMG Abdominal  soft, nontender, without masses, organomegaly or hernia Breasts  examined lying and sitting without masses, retractions, discharge or axillary adenopathy. Pelvic  Ext/BUS/vagina  normal with white discharge  Cervix  normal   Uterus  axial, normal size, shape and contour, midline and mobile nontender   Adnexa  Without masses or tenderness    Anus and perineum  normal   Rectovaginal  normal sphincter tone without palpated masses or tenderness.    Assessment/Plan:  46 y.o. G0P0 female for annual exam.   1. Vaginal discharge and itching. Recent course of antibiotics for sinusitis. Wet prep consistent with bacterial vaginosis. We'll treat with Flagyl 500 g twice a day x7 days, alcohol avoidance reviewed. As it did follow antibiotics I'm going to cover her with Diflucan 150x1 dose also. Follow up if symptoms persist or recur. 2. GYN/Contraception. Regular menses without issues.  Patient is not sexually active and does not need contraception. Knows the need if she does become sexually active. 3. Pap smear.  No Pap smear done today.  Last Pap smear 2011. No history of abnormal Pap smears. Plan Pap smear next year at 3 year interval. 4. Mammography. Patient has scheduled 2 weeks now almost follow up for this. SBE monthly reviewed. 5. Health maintenance. No lab work done today as it's done  through her primary physician's office. Follow up one year, sooner as needed.    [name], 2:27 PM 07/04/2012

## 2012-07-22 ENCOUNTER — Encounter: Payer: Self-pay | Admitting: Internal Medicine

## 2012-07-25 ENCOUNTER — Encounter: Payer: Self-pay | Admitting: Gynecology

## 2012-08-11 ENCOUNTER — Ambulatory Visit (INDEPENDENT_AMBULATORY_CARE_PROVIDER_SITE_OTHER): Payer: Federal, State, Local not specified - PPO | Admitting: Internal Medicine

## 2012-08-11 ENCOUNTER — Encounter: Payer: Self-pay | Admitting: Internal Medicine

## 2012-08-11 VITALS — BP 150/90 | HR 88 | Temp 98.8°F | Resp 18 | Wt 211.0 lb

## 2012-08-11 DIAGNOSIS — I10 Essential (primary) hypertension: Secondary | ICD-10-CM

## 2012-08-11 DIAGNOSIS — J309 Allergic rhinitis, unspecified: Secondary | ICD-10-CM

## 2012-08-11 MED ORDER — RAMIPRIL 5 MG PO CAPS: 5.0000 mg | ORAL_CAPSULE | Freq: Every day | ORAL | Status: DC

## 2012-08-11 NOTE — Patient Instructions (Signed)
Limit your sodium (Salt) intake  You need to lose weight.  Consider a lower calorie diet and regular exercise.  Please check your blood pressure on a regular basis.  If it is consistently greater than 150/90, please make an office appointment.  

## 2012-08-11 NOTE — Progress Notes (Signed)
Subjective:    Patient ID: Catherine Campbell, female    DOB: [birth year], 46 y.o.   MRN: [id number]  HPI  46 year old patient who has treated hypertension. She has allergic rhinitis and is followed by allergy medicine. She is doing quite well. Home blood pressure readings have been well-controlled. No concerns or complaints today  BP Readings from Last 3 Encounters:  08/11/12 150/90  07/04/12 118/76  02/04/12 118/72     Past Medical History  Diagnosis Date  . Hypertension     History   Social History  . Marital Status: Single    Spouse Name: N/A    Number of Children: N/A  . Years of Education: N/A   Occupational History  . Not on file.   Social History Main Topics  . Smoking status: Never Smoker   . Smokeless tobacco: Never Used  . Alcohol Use: No  . Drug Use: No  . Sexually Active: Yes   Other Topics Concern  . Not on file   Social History Narrative  . No narrative on file    Past Surgical History  Procedure Date  . Boil under arm     Family History  Problem Relation Age of Onset  . Hypertension Mother   . Diabetes Father   . Hypertension Father     Allergies  Allergen Reactions  . Metaxalone   . Penicillins   . Tramadol-Acetaminophen     Current Outpatient Prescriptions on File Prior to Visit  Medication Sig Dispense Refill  . Azelastine-Fluticasone (DYMISTA NA) Place into the nose as directed.      . beclomethasone (QVAR) 40 MCG/ACT inhaler Inhale 2 puffs into the lungs 2 (two) times daily.      . cetirizine (ZYRTEC) 10 MG tablet Take 10 mg by mouth daily.      . fexofenadine (ALLEGRA) 180 MG tablet Take 180 mg by mouth daily.        Marland Kitchen ibuprofen (ADVIL,MOTRIN) 200 MG tablet Take 200 mg by mouth every 6 (six) hours as needed.      . ramipril (ALTACE) 5 MG capsule TAKE ONE CAPSULE BY MOUTH ONCE DAILY  90 capsule  3  . UNABLE TO FIND Allergy shots twice a week       . fluconazole (DIFLUCAN) 150 MG tablet Take 1 tablet (150 mg total) by mouth once.   1 tablet  0  . metroNIDAZOLE (FLAGYL) 500 MG tablet Take 1 tablet (500 mg total) by mouth 2 (two) times daily. For 7 days.  Avoid alcohol while taking  14 tablet  0    BP 150/90  Pulse 88  Temp 98.8 F (37.1 C) (Oral)  Resp 18  Wt 211 lb (95.709 kg)  SpO2 100%  LMP 07/22/2012    Review of Systems  Constitutional: Negative.   HENT: Negative for hearing loss, congestion, sore throat, rhinorrhea, dental problem, sinus pressure and tinnitus.   Eyes: Negative for pain, discharge and visual disturbance.  Respiratory: Negative for cough and shortness of breath.   Cardiovascular: Negative for chest pain, palpitations and leg swelling.  Gastrointestinal: Negative for nausea, vomiting, abdominal pain, diarrhea, constipation, blood in stool and abdominal distention.  Genitourinary: Negative for dysuria, urgency, frequency, hematuria, flank pain, vaginal bleeding, vaginal discharge, difficulty urinating, vaginal pain and pelvic pain.  Musculoskeletal: Negative for joint swelling, arthralgias and gait problem.  Skin: Negative for rash.  Neurological: Negative for dizziness, syncope, speech difficulty, weakness, numbness and headaches.  Hematological: Negative for adenopathy.  Psychiatric/Behavioral: Negative for  behavioral problems, dysphoric mood and agitation. The patient is not nervous/anxious.        Objective:   Physical Exam  Constitutional: She is oriented to person, place, and time. She appears well-developed and well-nourished.  HENT:  Head: Normocephalic.  Right Ear: External ear normal.  Left Ear: External ear normal.  Mouth/Throat: Oropharynx is clear and moist.  Eyes: Conjunctivae normal and EOM are normal. Pupils are equal, round, and reactive to light.  Neck: Normal range of motion. Neck supple. No thyromegaly present.  Cardiovascular: Normal rate, regular rhythm, normal heart sounds and intact distal pulses.   Pulmonary/Chest: Effort normal and breath sounds normal.   Abdominal: Soft. Bowel sounds are normal. She exhibits no mass. There is no tenderness.  Musculoskeletal: Normal range of motion.  Lymphadenopathy:    She has no cervical adenopathy.  Neurological: She is alert and oriented to person, place, and time.  Skin: Skin is warm and dry. No rash noted.  Psychiatric: She has a normal mood and affect. Her behavior is normal.          Assessment & Plan:   Hypertension stable Allergic rhinitis  Meds refilled Recheck 6 months

## 2012-09-10 ENCOUNTER — Encounter: Payer: Self-pay | Admitting: Family Medicine

## 2012-09-10 ENCOUNTER — Ambulatory Visit (INDEPENDENT_AMBULATORY_CARE_PROVIDER_SITE_OTHER): Payer: Federal, State, Local not specified - PPO | Admitting: Family Medicine

## 2012-09-10 VITALS — BP 146/70 | HR 104 | Temp 98.9°F | Wt 211.8 lb

## 2012-09-10 DIAGNOSIS — J069 Acute upper respiratory infection, unspecified: Secondary | ICD-10-CM

## 2012-09-10 MED ORDER — HYDROCODONE-HOMATROPINE 5-1.5 MG/5ML PO SYRP: 5.0000 mL | ORAL_SOLUTION | Freq: Four times a day (QID) | ORAL | Status: AC | PRN

## 2012-09-10 NOTE — Progress Notes (Signed)
Subjective:    Patient ID: Eleonore Chiquito, female    DOB: 11-08-1965, 47 y.o.   MRN: [id number]  HPI  Acute visit. Patient had onset this past Tuesday mild sore throat. By Wednesday she had nasal congestion and onset dry cough. Possible low-grade fever Wednesday but none since then. She has  increased fatigue. No body aches. Mild intermittent headaches. Nonsmoker. Cough especially bothersome at night. No relief with over-the-counter medication. She has history of asthma which is been fairly well controlled with Qvar. She is followed by allergist.  Review of Systems  Constitutional: Positive for fatigue. Negative for fever and chills.  HENT: Positive for congestion and sore throat.   Respiratory: Positive for cough. Negative for shortness of breath and wheezing.   Neurological: Positive for headaches.       Objective:   Physical Exam  Constitutional: She appears well-developed and well-nourished.  HENT:  Right Ear: External ear normal.  Left Ear: External ear normal.  Mouth/Throat: Oropharynx is clear and moist.  Neck: Neck supple.  Cardiovascular: Normal rate and regular rhythm.   Pulmonary/Chest: Effort normal and breath sounds normal. No respiratory distress. She has no wheezes. She has no rales.  Lymphadenopathy:    She has no cervical adenopathy.          Assessment & Plan:  Viral URI. Hycodan cough syrup as needed for nighttime use. Followup as needed

## 2012-09-10 NOTE — Patient Instructions (Signed)

## 2013-04-26 ENCOUNTER — Ambulatory Visit (INDEPENDENT_AMBULATORY_CARE_PROVIDER_SITE_OTHER): Payer: Federal, State, Local not specified - PPO | Admitting: Family Medicine

## 2013-04-26 ENCOUNTER — Encounter: Payer: Self-pay | Admitting: Family Medicine

## 2013-04-26 VITALS — BP 130/68 | HR 104 | Temp 97.8°F | Wt 219.0 lb

## 2013-04-26 DIAGNOSIS — L259 Unspecified contact dermatitis, unspecified cause: Secondary | ICD-10-CM

## 2013-04-26 MED ORDER — TRIAMCINOLONE ACETONIDE 0.1 % EX CREA: TOPICAL_CREAM | Freq: Two times a day (BID) | CUTANEOUS | Status: DC

## 2013-04-26 NOTE — Progress Notes (Signed)
Subjective:    Patient ID: Catherine Campbell, female    DOB: 06-Feb-1966, 47 y.o.   MRN: [id number]  HPI Acute visit for pruritic rash left arm greater than right Onset about 2 weeks ago. Initially she fell this was related to allergy injection. She apparently developed some hives after her last in office injection but allergist did not feel this rash was related She's had only minimal involvement of her legs. She's tried hydrocortisone cream with mild improvement. She's been taking Zyrtec and Allegra regularly without much improvement. Pruritus is moderate and worse at night. Heat also exacerbates.  Past Medical History  Diagnosis Date  . Hypertension    Past Surgical History  Procedure Laterality Date  . Boil under arm      reports that she has never smoked. She has never used smokeless tobacco. She reports that she does not drink alcohol or use illicit drugs. family history includes Diabetes in her father; Hypertension in her father and mother. Allergies  Allergen Reactions  . Metaxalone   . Penicillins   . Tramadol-Acetaminophen       Review of Systems  Constitutional: Negative for fever and chills.  Skin: Positive for rash.       Objective:   Physical Exam  Constitutional: She appears well-developed and well-nourished.  Cardiovascular: Normal rate and regular rhythm.   Pulmonary/Chest: Effort normal and breath sounds normal. No respiratory distress. She has no wheezes. She has no rales.  Skin: Rash noted.  Patient has fairly nonspecific erythematous minimally raised rash inner aspect left forearm and lateral aspect right forearm. No pustules. No vesicles. Nontender. Blanches with pressure          Assessment & Plan:  Skin rash. Question contact allergy. Triamcinolone 0.1% cream twice a day. Avoid heat. She already had recent steroid IM injection per allergist for hives secondary to immunotherapy reaction.  Current rash not c/w hives.

## 2013-05-02 ENCOUNTER — Ambulatory Visit (INDEPENDENT_AMBULATORY_CARE_PROVIDER_SITE_OTHER): Payer: Federal, State, Local not specified - PPO | Admitting: Family Medicine

## 2013-05-02 ENCOUNTER — Encounter (INDEPENDENT_AMBULATORY_CARE_PROVIDER_SITE_OTHER): Payer: Federal, State, Local not specified - PPO

## 2013-05-02 ENCOUNTER — Telehealth: Payer: Self-pay | Admitting: Internal Medicine

## 2013-05-02 ENCOUNTER — Ambulatory Visit (INDEPENDENT_AMBULATORY_CARE_PROVIDER_SITE_OTHER)
Admission: RE | Admit: 2013-05-02 | Discharge: 2013-05-02 | Disposition: A | Discharge status: Home / Self Care | Payer: Federal, State, Local not specified - PPO | Source: Ambulatory Visit | Attending: Family Medicine | Admitting: Family Medicine

## 2013-05-02 ENCOUNTER — Encounter: Payer: Self-pay | Admitting: Family Medicine

## 2013-05-02 VITALS — BP 136/80 | Temp 98.8°F | Wt 217.0 lb

## 2013-05-02 DIAGNOSIS — M79609 Pain in unspecified limb: Secondary | ICD-10-CM

## 2013-05-02 DIAGNOSIS — M25572 Pain in left ankle and joints of left foot: Secondary | ICD-10-CM

## 2013-05-02 DIAGNOSIS — M7989 Other specified soft tissue disorders: Secondary | ICD-10-CM

## 2013-05-02 DIAGNOSIS — M25579 Pain in unspecified ankle and joints of unspecified foot: Secondary | ICD-10-CM

## 2013-05-02 DIAGNOSIS — R609 Edema, unspecified: Secondary | ICD-10-CM

## 2013-05-02 DIAGNOSIS — R600 Localized edema: Secondary | ICD-10-CM

## 2013-05-02 NOTE — Telephone Encounter (Signed)
Caller: Catherine Campbell/Patient; Phone: [phone]; Reason for Call: Patient is calling.  Patient states she was seen in the office 05/02/13 for ankle swelling.  Patient states she had an Xray and Scan completed and is calling for results.  RN reviewed Counselling psychologist Health Record.  Documentation noted, per Dr.  Selena Batten: Notes Recorded by Terressa Koyanagi, DO on 05/02/2013 at 2: 25 PM No blood clots or fractures on tests.  Likely ankle sprain - advise ice, elevation, no strenuous activity, avoid sodium, follow up with PCP if needed.   Patient informed of above.  Call back parameters reviewed.  Patient verbalizes understanding.  Patient states she has a follow up appt.  Scheduled 05/09/13 with Dr.  Amador Cunas.  Patient advised to return call sooner if no improvement or sx increase.  Patient verbalizes understanding.

## 2013-05-02 NOTE — Progress Notes (Signed)
Chief Complaint  Patient presents with  . left ankle swelling    HPI:  Catherine Campbell, a 47 yo F patient of Dr. Amador Cunas is here for an acute visit for the following:  1) LE edema: -bilat started after being outside on feet and at cookout where she ate a lot of sodium - hot dogs, etc a few days ago -she does think she twisted the L ankle and this hurts a little, but she wants to check for blood clot - pain with bearing weight on L ankle but can bear weight -denies: fevers, chills, CP, SOB, DOE, cough, new medications   ROS: See pertinent positives and negatives per HPI.  Past Medical History  Diagnosis Date  . Hypertension     Past Surgical History  Procedure Laterality Date  . Boil under arm      Family History  Problem Relation Age of Onset  . Hypertension Mother   . Diabetes Father   . Hypertension Father     History   Social History  . Marital Status: Single    Spouse Name: N/A    Number of Children: N/A  . Years of Education: N/A   Social History Main Topics  . Smoking status: Never Smoker   . Smokeless tobacco: Never Used  . Alcohol Use: No  . Drug Use: No  . Sexual Activity: Yes   Other Topics Concern  . None   Social History Narrative  . None    Current outpatient prescriptions:Azelastine-Fluticasone (DYMISTA NA), Place into the nose as directed., Disp: , Rfl: ;  cetirizine (ZYRTEC) 10 MG tablet, Take 10 mg by mouth daily., Disp: , Rfl: ;  fexofenadine (ALLEGRA) 180 MG tablet, Take 180 mg by mouth daily.  , Disp: , Rfl: ;  fluticasone (FLOVENT HFA) 110 MCG/ACT inhaler, Inhale 2 puffs into the lungs 2 (two) times daily., Disp: , Rfl:  ibuprofen (ADVIL,MOTRIN) 200 MG tablet, Take 200 mg by mouth every 6 (six) hours as needed., Disp: , Rfl: ;  ramipril (ALTACE) 5 MG capsule, Take 1 capsule (5 mg total) by mouth daily., Disp: 90 capsule, Rfl: 6;  triamcinolone cream (KENALOG) 0.1 %, Apply topically 2 (two) times daily., Disp: 30 g, Rfl: 2;  UNABLE TO  FIND, Allergy shots twice a week , Disp: , Rfl:   EXAM:  Filed Vitals:   05/02/13 1017  BP: 136/80  Temp: 98.8 F (37.1 C)    Body mass index is 36.11 kg/(m^2).  GENERAL: vitals reviewed and listed above, alert, oriented, appears well hydrated and in no acute distress  HEENT: atraumatic, conjunttiva clear, no obvious abnormalities on inspection of external nose and ears  NECK: no obvious masses on inspection  LUNGS: clear to auscultation bilaterally, no wheezes, rales or rhonchi, good air movement  CV: HRRR, bilat 1+ pitting edema in ankles L > R   MS: moves all extremities without noticeable abnormality -L abkle swelling and lateral ankle lig TTP and lateral maleolar TTP  PSYCH: pleasant and cooperative, no obvious depression or anxiety  ASSESSMENT AND PLAN:  Discussed the following assessment and plan:  Lower extremity edema - Plan: Lower Extremity Venous Duplex Left  Left ankle pain - Plan: DG Ankle Complete Left  -Poor history, sounds like ate more sodium then usual, was out in heat and has developed some bilat swelling in ankles - ? Also of L ankle sprain and more swelling on L then on R -does have difuse ankle TTP, bilat ankle edema, some TTP  maleolar on L -will get duplex given her concern for clot, but doubt - no calf swelling or pain -will get plain film of L ankle to exclude fracture, no instability -advised elevation, compression, avoidance of sodium -Patient advised to return or notify a doctor immediately if symptoms worsen or persist or new concerns arise.  Patient Instructions  -get ultrasound and xrays as instructed  -elevated leg, compression, avoid sodium  -follow up as needed     [name]

## 2013-05-02 NOTE — Patient Instructions (Addendum)
-  get ultrasound and xrays as instructed  -elevated leg, compression, avoid sodium  -follow up as needed if worsening and with your doctor next week

## 2013-05-03 NOTE — Progress Notes (Signed)
Quick Note:  Left a message for pt to return call. ______ 

## 2013-05-09 ENCOUNTER — Encounter: Payer: Self-pay | Admitting: Internal Medicine

## 2013-05-09 ENCOUNTER — Ambulatory Visit (INDEPENDENT_AMBULATORY_CARE_PROVIDER_SITE_OTHER): Payer: Federal, State, Local not specified - PPO | Admitting: Internal Medicine

## 2013-05-09 VITALS — BP 130/80 | HR 97 | Temp 98.1°F | Resp 20 | Wt 219.0 lb

## 2013-05-09 DIAGNOSIS — J309 Allergic rhinitis, unspecified: Secondary | ICD-10-CM

## 2013-05-09 DIAGNOSIS — I10 Essential (primary) hypertension: Secondary | ICD-10-CM

## 2013-05-09 NOTE — Patient Instructions (Signed)
Call or return to clinic prn if these symptoms worsen or fail to improve as anticipated.  Return in 3 months for follow-up  

## 2013-05-09 NOTE — Progress Notes (Signed)
Subjective:    Patient ID: Catherine Campbell, female    DOB: [birth year], 47 y.o.   MRN: [id number]  HPI  47 year old patient who is seen today for followup of a left ankle sprain she was seen 7 days ago and radiographs and lower extremity venous Doppler studies were performed. These were unremarkable except for some soft tissue swelling. She still has mild left lateral ankle pain but seems to be improving. She's also been having some difficulties with a pretty rash involving both arms. Presently there is a mild maculopapular pruritic rash involving the right upper outer arm. She has been treated with triamcinolone with some benefit. Her main concern is post comment of allergy shots do to the presence of the rash.  She is on chronic antihistamine therapy  Past Medical History  Diagnosis Date  . Hypertension     History   Social History  . Marital Status: Single    Spouse Name: N/A    Number of Children: N/A  . Years of Education: N/A   Occupational History  . Not on file.   Social History Main Topics  . Smoking status: Never Smoker   . Smokeless tobacco: Never Used  . Alcohol Use: No  . Drug Use: No  . Sexual Activity: Yes   Other Topics Concern  . Not on file   Social History Narrative  . No narrative on file    Past Surgical History  Procedure Laterality Date  . Boil under arm      Family History  Problem Relation Age of Onset  . Hypertension Mother   . Diabetes Father   . Hypertension Father     Allergies  Allergen Reactions  . Metaxalone   . Penicillins   . Tramadol-Acetaminophen     Current Outpatient Prescriptions on File Prior to Visit  Medication Sig Dispense Refill  . Azelastine-Fluticasone (DYMISTA NA) Place into the nose as directed.      . cetirizine (ZYRTEC) 10 MG tablet Take 10 mg by mouth daily.      . fexofenadine (ALLEGRA) 180 MG tablet Take 180 mg by mouth daily.        . fluticasone (FLOVENT HFA) 110 MCG/ACT inhaler Inhale 2 puffs into the  lungs 2 (two) times daily.      Marland Kitchen ibuprofen (ADVIL,MOTRIN) 200 MG tablet Take 200 mg by mouth every 6 (six) hours as needed.      . ramipril (ALTACE) 5 MG capsule Take 1 capsule (5 mg total) by mouth daily.  90 capsule  6  . triamcinolone cream (KENALOG) 0.1 % Apply topically 2 (two) times daily.  30 g  2  . UNABLE TO FIND Allergy shots twice a week        No current facility-administered medications on file prior to visit.    BP 130/80  Pulse 97  Temp(Src) 98.1 F (36.7 C) (Oral)  Resp 20  Wt 219 lb (99.338 kg)  BMI 36.44 kg/m2  SpO2 100%  LMP 04/26/2013       Review of Systems  Constitutional: Negative.   HENT: Negative for hearing loss, congestion, sore throat, rhinorrhea, dental problem, sinus pressure and tinnitus.   Eyes: Negative for pain, discharge and visual disturbance.  Respiratory: Negative for cough and shortness of breath.   Cardiovascular: Negative for chest pain, palpitations and leg swelling.  Gastrointestinal: Negative for nausea, vomiting, abdominal pain, diarrhea, constipation, blood in stool and abdominal distention.  Genitourinary: Negative for dysuria, urgency, frequency, hematuria, flank pain, vaginal  bleeding, vaginal discharge, difficulty urinating, vaginal pain and pelvic pain.  Musculoskeletal: Positive for joint swelling. Negative for arthralgias and gait problem.  Skin: Positive for rash.  Neurological: Negative for dizziness, syncope, speech difficulty, weakness, numbness and headaches.  Hematological: Negative for adenopathy.  Psychiatric/Behavioral: Negative for behavioral problems, dysphoric mood and agitation. The patient is not nervous/anxious.        Objective:   Physical Exam  Constitutional: She appears well-developed and well-nourished. No distress.  Musculoskeletal:  Moderate edema is changes left ankle. Mild tenderness over the left lateral malleolus  Skin:  Very mild maculopapular eruption involving the right upper outer arm.  Mildly pruritic          Assessment & Plan:   Resolving left ankle strain Nonspecific dermatitis. Continue present therapy. Followup with allergy Hypertension stable. Followed December as scheduled

## 2013-05-09 NOTE — Progress Notes (Signed)
Quick Note:  Left a detailed message for pt at designated cell phone number. ______

## 2013-06-06 ENCOUNTER — Other Ambulatory Visit: Payer: Self-pay | Admitting: Family Medicine

## 2013-06-24 DIAGNOSIS — R87620 Atypical squamous cells of undetermined significance on cytologic smear of vagina (ASC-US): Secondary | ICD-10-CM

## 2013-06-24 HISTORY — DX: Vaginal high risk human papillomavirus (HPV) DNA test positive: R87.620

## 2013-07-07 ENCOUNTER — Encounter: Payer: Self-pay | Admitting: Gynecology

## 2013-07-07 ENCOUNTER — Other Ambulatory Visit (HOSPITAL_COMMUNITY)
Admission: RE | Admit: 2013-07-07 | Discharge: 2013-07-07 | Disposition: A | Discharge status: Home / Self Care | Payer: Federal, State, Local not specified - PPO | Source: Ambulatory Visit | Attending: Gynecology | Admitting: Gynecology

## 2013-07-07 ENCOUNTER — Ambulatory Visit (INDEPENDENT_AMBULATORY_CARE_PROVIDER_SITE_OTHER): Payer: Federal, State, Local not specified - PPO | Admitting: Gynecology

## 2013-07-07 VITALS — BP 130/80 | Ht 66.0 in | Wt 216.0 lb

## 2013-07-07 DIAGNOSIS — Z1151 Encounter for screening for human papillomavirus (HPV): Secondary | ICD-10-CM | POA: Insufficient documentation

## 2013-07-07 DIAGNOSIS — R87810 Cervical high risk human papillomavirus (HPV) DNA test positive: Secondary | ICD-10-CM | POA: Insufficient documentation

## 2013-07-07 DIAGNOSIS — Z01419 Encounter for gynecological examination (general) (routine) without abnormal findings: Secondary | ICD-10-CM | POA: Insufficient documentation

## 2013-07-07 DIAGNOSIS — N841 Polyp of cervix uteri: Secondary | ICD-10-CM

## 2013-07-07 NOTE — Patient Instructions (Addendum)
Followup in one year for annual exam. Office will call you with pathology results.

## 2013-07-07 NOTE — Addendum Note (Signed)
Addended by: Dayna Barker on: 07/07/2013 03:03 PM   Modules accepted: Orders

## 2013-07-07 NOTE — Progress Notes (Addendum)
Catherine Campbell 04-05-1966 [id number]        47 y.o.  G0P0 for annual exam.  Doing well without complaints.  Past medical history,surgical history, problem list, medications, allergies, family history and social history were all reviewed and documented in the EPIC chart.  ROS:  Performed and pertinent positives and negatives are included in the history, assessment and plan .  Exam: Kim assistant Filed Vitals:   07/07/13 1359  BP: 130/80  Height: 5\' 6"  (1.676 m)  Weight: 216 lb (97.977 kg)   General appearance  Normal Skin grossly normal Head/Neck normal with no cervical or supraclavicular adenopathy thyroid normal Lungs  clear Cardiac RR, without RMG Abdominal  soft, nontender, without masses, organomegaly or hernia Breasts  examined lying and sitting without masses, retractions, discharge or axillary adenopathy. Pelvic  Ext/BUS/vagina  normal  Cervix  Normal with 2 small endocervical polyps  Uterus  anteverted, normal size, shape and contour, midline and mobile nontender   Adnexa  Without masses or tenderness    Anus and perineum  normal   Rectovaginal  normal sphincter tone without palpated masses or tenderness.    Assessment/Plan:  47 y.o. G0P0 female for annual exam, regular menses not sexually active.   1. Contraception. As in the past patient is not interested in contraception. She is not sexually active nor plans to be any time soon. Will followup if the need arises. 2. Endocervical polyps.  Removed with ring forceps and sent to pathology.  Monsels placed afterwards for hemostasis. 3. Pap smear 2011. Pap/HPV today. No history of abnormal Pap smears previously. 4. Mammography scheduled 2 weeks. SBE monthly review. 5. Health maintenance. No lab work done as it is all done through her primary physician's office. Followup one year, sooner as needed.  Note: This document was prepared with digital dictation and possible smart phrase technology. Any transcriptional errors that  result from this process are unintentional.   [name], 2:25 PM 07/07/2013

## 2013-07-13 ENCOUNTER — Encounter: Payer: Self-pay | Admitting: Gynecology

## 2013-07-13 ENCOUNTER — Other Ambulatory Visit: Payer: Self-pay | Admitting: Gynecology

## 2013-07-13 MED ORDER — FLUCONAZOLE 150 MG PO TABS: 150.0000 mg | ORAL_TABLET | Freq: Once | ORAL | Status: DC

## 2013-07-24 ENCOUNTER — Encounter: Payer: Self-pay | Admitting: Gynecology

## 2013-08-02 ENCOUNTER — Encounter: Payer: Self-pay | Admitting: Gynecology

## 2013-08-02 ENCOUNTER — Ambulatory Visit (INDEPENDENT_AMBULATORY_CARE_PROVIDER_SITE_OTHER): Payer: Federal, State, Local not specified - PPO | Admitting: Gynecology

## 2013-08-02 DIAGNOSIS — R87810 Cervical high risk human papillomavirus (HPV) DNA test positive: Secondary | ICD-10-CM

## 2013-08-02 DIAGNOSIS — R87610 Atypical squamous cells of undetermined significance on cytologic smear of cervix (ASC-US): Secondary | ICD-10-CM

## 2013-08-02 NOTE — Progress Notes (Signed)
Patient ID: Catherine Campbell, female   DOB: 19-May-1966, 47 y.o.   MRN: [id number] Patient presents for colposcopy with recent Pap smear showing ascus positive high-risk HPV. She does give a history of abnormal Pap smear several years ago but it was repeated and followed expectantly with no treatment. Review of her past Pap smears on Epic were normal 2009, 2010, 2011  Exam was [employer] External BUS vagina normal. Cervix grossly normal.  Colposcopy after acetic acid cleanse is adequate with faint area of acetowhite change 3:00 position transformation zone. Representative biopsy taken. Physical Exam  Genitourinary:     Assessment and plan: ASCUS with positive high-risk HPV without history of significant abnormal Pap smears previously. Colposcopy is adequate showing area of acetowhite change 3:00 transformation zone. Rep. biopsy taken. I reviewed with the patient the issues of cervical dysplasia, HPV relationship, progression/regression, treatment versus observation. Patient will followup for results. If low-grade and plan expectant management with repeat Pap smear/HPV in one year. If otherwise then we'll triage based on results.

## 2013-08-02 NOTE — Patient Instructions (Signed)
Office will call you with the biopsy results 

## 2013-08-02 NOTE — Addendum Note (Signed)
Addended by: Dayna Barker on: 08/02/2013 04:34 PM   Modules accepted: Orders

## 2013-08-07 ENCOUNTER — Encounter: Payer: Self-pay | Admitting: Gynecology

## 2013-08-19 ENCOUNTER — Other Ambulatory Visit: Payer: Self-pay | Admitting: Internal Medicine

## 2013-08-30 ENCOUNTER — Encounter: Payer: Self-pay | Admitting: Internal Medicine

## 2013-08-30 ENCOUNTER — Ambulatory Visit (INDEPENDENT_AMBULATORY_CARE_PROVIDER_SITE_OTHER): Payer: Federal, State, Local not specified - PPO | Admitting: Internal Medicine

## 2013-08-30 VITALS — BP 130/84 | HR 98 | Temp 98.4°F | Resp 20 | Ht 65.0 in | Wt 222.0 lb

## 2013-08-30 DIAGNOSIS — I10 Essential (primary) hypertension: Secondary | ICD-10-CM

## 2013-08-30 DIAGNOSIS — E785 Hyperlipidemia, unspecified: Secondary | ICD-10-CM

## 2013-08-30 DIAGNOSIS — J45909 Unspecified asthma, uncomplicated: Secondary | ICD-10-CM | POA: Insufficient documentation

## 2013-08-30 NOTE — Progress Notes (Signed)
Subjective:    Patient ID: Catherine Campbell, female    DOB: 08/05/1966, 48 y.o.   MRN: [id number]  HPI   48 year old patient who is seen today for her biannual followup.  She has hypertension and chronic stable asthma. Doing quite well today without concerns or complaints. She remains on maintenance medication which controls her asthma well. She's had a recent gynecologic evaluation. Past Medical History  Diagnosis Date  . Hypertension   . Asthma   . ASCUS with positive high risk human papillomavirus of vagina 06/2013    Colposcopic biopsy koilocytotic atypia. Recommend repeat Pap smear/HPV 2015    History   Social History  . Marital Status: Single    Spouse Name: N/A    Number of Children: N/A  . Years of Education: N/A   Occupational History  . Not on file.   Social History Main Topics  . Smoking status: Never Smoker   . Smokeless tobacco: Never Used  . Alcohol Use: No  . Drug Use: No  . Sexual Activity: No   Other Topics Concern  . Not on file   Social History Narrative  . No narrative on file    Past Surgical History  Procedure Laterality Date  . Boil under arm      Family History  Problem Relation Age of Onset  . Hypertension Mother   . Diabetes Father   . Hypertension Father     Allergies  Allergen Reactions  . Metaxalone   . Penicillins   . Tramadol-Acetaminophen     Current Outpatient Prescriptions on File Prior to Visit  Medication Sig Dispense Refill  . Azelastine-Fluticasone (DYMISTA NA) Place into the nose as directed.      . cetirizine (ZYRTEC) 10 MG tablet Take 10 mg by mouth daily.      . fexofenadine (ALLEGRA) 180 MG tablet Take 180 mg by mouth daily.        . Fluticasone-Salmeterol (ADVAIR HFA IN) Inhale into the lungs.      Marland Kitchen ibuprofen (ADVIL,MOTRIN) 200 MG tablet Take 200 mg by mouth every 6 (six) hours as needed.      . montelukast (SINGULAIR) 10 MG tablet Take 10 mg by mouth at bedtime.       . ramipril (ALTACE) 5 MG capsule TAKE  1 CAPSULE (5 MG TOTAL) BY MOUTH DAILY.  90 capsule  3  . UNABLE TO FIND Allergy shots twice a week        No current facility-administered medications on file prior to visit.    BP 130/84  Pulse 98  Temp(Src) 98.4 F (36.9 C) (Oral)  Resp 20  Ht 5\' 5"  (1.651 m)  Wt 222 lb (100.699 kg)  BMI 36.94 kg/m2  SpO2 97%  LMP 08/20/2013        Review of Systems  Constitutional: Negative.   HENT: Negative for congestion, dental problem, hearing loss, rhinorrhea, sinus pressure, sore throat and tinnitus.   Eyes: Negative for pain, discharge and visual disturbance.  Respiratory: Negative for cough and shortness of breath.   Cardiovascular: Negative for chest pain, palpitations and leg swelling.  Gastrointestinal: Negative for nausea, vomiting, abdominal pain, diarrhea, constipation, blood in stool and abdominal distention.  Genitourinary: Negative for dysuria, urgency, frequency, hematuria, flank pain, vaginal bleeding, vaginal discharge, difficulty urinating, vaginal pain and pelvic pain.  Musculoskeletal: Negative for arthralgias, gait problem and joint swelling.  Skin: Negative for rash.  Neurological: Negative for dizziness, syncope, speech difficulty, weakness, numbness and headaches.  Hematological:  Negative for adenopathy.  Psychiatric/Behavioral: Negative for behavioral problems, dysphoric mood and agitation. The patient is not nervous/anxious.        Objective:   Physical Exam  Constitutional: She is oriented to person, place, and time. She appears well-developed and well-nourished.  HENT:  Head: Normocephalic.  Right Ear: External ear normal.  Left Ear: External ear normal.  Mouth/Throat: Oropharynx is clear and moist.  Eyes: Conjunctivae and EOM are normal. Pupils are equal, round, and reactive to light.  Neck: Normal range of motion. Neck supple. No thyromegaly present.  Cardiovascular: Normal rate, regular rhythm, normal heart sounds and intact distal pulses.     Pulmonary/Chest: Effort normal and breath sounds normal.  Abdominal: Soft. Bowel sounds are normal. She exhibits no mass. There is no tenderness.  Musculoskeletal: Normal range of motion.  Lymphadenopathy:    She has no cervical adenopathy.  Neurological: She is alert and oriented to person, place, and time.  Skin: Skin is warm and dry. No rash noted.  Psychiatric: She has a normal mood and affect. Her behavior is normal.          Assessment & Plan:  Hypertension. Well controlled we'll continue present regimen Asthma stable. No change in therapy Exogenous obesity. Weight loss and exercise encouraged Recheck 6 months

## 2013-08-30 NOTE — Patient Instructions (Signed)

## 2013-08-30 NOTE — Progress Notes (Signed)
Pre-visit discussion using our clinic review tool. No additional management support is needed unless otherwise documented below in the visit note.  

## 2013-08-31 ENCOUNTER — Telehealth: Payer: Self-pay | Admitting: Internal Medicine

## 2013-08-31 NOTE — Telephone Encounter (Signed)
Relevant patient education assigned to patient using Emmi. ° °

## 2013-09-15 ENCOUNTER — Encounter: Payer: Self-pay | Admitting: Internal Medicine

## 2013-09-15 ENCOUNTER — Ambulatory Visit (INDEPENDENT_AMBULATORY_CARE_PROVIDER_SITE_OTHER): Payer: Federal, State, Local not specified - PPO | Admitting: Internal Medicine

## 2013-09-15 VITALS — BP 144/90 | HR 114 | Temp 98.8°F | Resp 20 | Ht 65.0 in | Wt 220.0 lb

## 2013-09-15 DIAGNOSIS — J069 Acute upper respiratory infection, unspecified: Secondary | ICD-10-CM

## 2013-09-15 DIAGNOSIS — J309 Allergic rhinitis, unspecified: Secondary | ICD-10-CM

## 2013-09-15 DIAGNOSIS — I10 Essential (primary) hypertension: Secondary | ICD-10-CM

## 2013-09-15 DIAGNOSIS — E785 Hyperlipidemia, unspecified: Secondary | ICD-10-CM

## 2013-09-15 MED ORDER — HYDROCODONE-HOMATROPINE 5-1.5 MG/5ML PO SYRP: 5.0000 mL | ORAL_SOLUTION | Freq: Four times a day (QID) | ORAL | Status: AC | PRN

## 2013-09-15 NOTE — Progress Notes (Signed)
Subjective:    Patient ID: Catherine Campbell, female    DOB: 02/04/1966, 48 y.o.   MRN: [id number]  HPI   48 year old patient who has treated hypertension and a history of allergic rhinitis and chronic asthma. She is on multiple maintenance medications. For the past 3 days she has had increasing chest congestion cough and low-grade fever. There's been some rhinorrhea but no sputum production and no wheezing. No shortness of breath no purulent sputum production.  Past Medical History  Diagnosis Date  . Hypertension   . Asthma   . ASCUS with positive high risk human papillomavirus of vagina 06/2013    Colposcopic biopsy koilocytotic atypia. Recommend repeat Pap smear/HPV 2015    History   Social History  . Marital Status: Single    Spouse Name: N/A    Number of Children: N/A  . Years of Education: N/A   Occupational History  . Not on file.   Social History Main Topics  . Smoking status: Never Smoker   . Smokeless tobacco: Never Used  . Alcohol Use: No  . Drug Use: No  . Sexual Activity: No   Other Topics Concern  . Not on file   Social History Narrative  . No narrative on file    Past Surgical History  Procedure Laterality Date  . Boil under arm      Family History  Problem Relation Age of Onset  . Hypertension Mother   . Diabetes Father   . Hypertension Father     Allergies  Allergen Reactions  . Metaxalone   . Penicillins   . Tramadol-Acetaminophen     Current Outpatient Prescriptions on File Prior to Visit  Medication Sig Dispense Refill  . Azelastine-Fluticasone (DYMISTA NA) Place into the nose as directed.      . cetirizine (ZYRTEC) 10 MG tablet Take 10 mg by mouth daily.      . fexofenadine (ALLEGRA) 180 MG tablet Take 180 mg by mouth daily.        . Fluticasone-Salmeterol (ADVAIR HFA IN) Inhale into the lungs.      Marland Kitchen ibuprofen (ADVIL,MOTRIN) 200 MG tablet Take 200 mg by mouth every 6 (six) hours as needed.      . montelukast (SINGULAIR) 10 MG  tablet Take 10 mg by mouth at bedtime.       . ramipril (ALTACE) 5 MG capsule TAKE 1 CAPSULE (5 MG TOTAL) BY MOUTH DAILY.  90 capsule  3  . UNABLE TO FIND Allergy shots twice a week        No current facility-administered medications on file prior to visit.    BP 144/90  Pulse 114  Temp(Src) 98.8 F (37.1 C) (Oral)  Resp 20  Ht 5\' 5"  (1.651 m)  Wt 220 lb (99.791 kg)  BMI 36.61 kg/m2  SpO2 97%  LMP 09/15/2013       Review of Systems  Constitutional: Positive for fever, activity change and appetite change.  HENT: Positive for congestion, postnasal drip and rhinorrhea. Negative for dental problem, hearing loss, sinus pressure, sore throat and tinnitus.   Eyes: Negative for pain, discharge and visual disturbance.  Respiratory: Positive for cough. Negative for shortness of breath.   Cardiovascular: Negative for chest pain, palpitations and leg swelling.  Gastrointestinal: Negative for nausea, vomiting, abdominal pain, diarrhea, constipation, blood in stool and abdominal distention.  Genitourinary: Negative for dysuria, urgency, frequency, hematuria, flank pain, vaginal bleeding, vaginal discharge, difficulty urinating, vaginal pain and pelvic pain.  Musculoskeletal: Negative for  arthralgias, gait problem and joint swelling.  Skin: Negative for rash.  Neurological: Negative for dizziness, syncope, speech difficulty, weakness, numbness and headaches.  Hematological: Negative for adenopathy.  Psychiatric/Behavioral: Negative for behavioral problems, dysphoric mood and agitation. The patient is not nervous/anxious.        Objective:   Physical Exam  Constitutional: She is oriented to person, place, and time. She appears well-developed and well-nourished. No distress.  Afebrile Frequent paroxysms of coughing No distress  HENT:  Head: Normocephalic.  Right Ear: External ear normal.  Left Ear: External ear normal.  Very mild erythema  Eyes: Conjunctivae and EOM are normal.  Pupils are equal, round, and reactive to light.  Neck: Normal range of motion. Neck supple. No thyromegaly present.  Cardiovascular: Normal rate, regular rhythm, normal heart sounds and intact distal pulses.   Pulmonary/Chest: Effort normal and breath sounds normal. No respiratory distress. She has no wheezes. She has no rales.  Abdominal: Soft. Bowel sounds are normal. She exhibits no mass. There is no tenderness.  Musculoskeletal: Normal range of motion.  Lymphadenopathy:    She has no cervical adenopathy.  Neurological: She is alert and oriented to person, place, and time.  Skin: Skin is warm and dry. No rash noted.  Psychiatric: She has a normal mood and affect. Her behavior is normal.          Assessment & Plan:   MR URI with cough. We'll continue maintenance medications and treat symptomatically. Hypertension stable asthma

## 2013-09-15 NOTE — Patient Instructions (Signed)
Acute bronchitis symptoms for less than 10 days are generally not helped by antibiotics.  Take over-the-counter expectorants and cough medications such as  Mucinex DM.  Call if there is no improvement in 5 to 7 days or if he developed worsening cough, fever, or new symptoms, such as shortness of breath or chest pain.    

## 2013-09-15 NOTE — Progress Notes (Signed)
Pre-visit discussion using our clinic review tool. No additional management support is needed unless otherwise documented below in the visit note.  

## 2013-09-18 ENCOUNTER — Encounter: Payer: Self-pay | Admitting: Internal Medicine

## 2013-09-18 ENCOUNTER — Telehealth: Payer: Self-pay | Admitting: Internal Medicine

## 2013-09-18 NOTE — Telephone Encounter (Signed)
Spoke to pt told her Return to Work note ready for pickup, will be at the front desk. Pt verbalized understanding.

## 2013-09-18 NOTE — Telephone Encounter (Signed)
Pt states she was seen on Friday and Dr. Kirtland BouchardK told her to call back if she needed a work note.  Pt is requesting a work note for today and tomorrow as she is unable to go to work.

## 2014-01-04 ENCOUNTER — Ambulatory Visit (INDEPENDENT_AMBULATORY_CARE_PROVIDER_SITE_OTHER): Payer: Federal, State, Local not specified - PPO | Admitting: Physician Assistant

## 2014-01-04 ENCOUNTER — Encounter: Payer: Self-pay | Admitting: Physician Assistant

## 2014-01-04 ENCOUNTER — Ambulatory Visit (INDEPENDENT_AMBULATORY_CARE_PROVIDER_SITE_OTHER)
Admission: RE | Admit: 2014-01-04 | Discharge: 2014-01-04 | Disposition: A | Discharge status: Home / Self Care | Payer: Federal, State, Local not specified - PPO | Source: Ambulatory Visit | Attending: Physician Assistant | Admitting: Physician Assistant

## 2014-01-04 VITALS — BP 110/70 | HR 88 | Temp 98.0°F | Resp 18 | Wt 221.0 lb

## 2014-01-04 DIAGNOSIS — S99929A Unspecified injury of unspecified foot, initial encounter: Secondary | ICD-10-CM

## 2014-01-04 DIAGNOSIS — S8990XA Unspecified injury of unspecified lower leg, initial encounter: Secondary | ICD-10-CM

## 2014-01-04 DIAGNOSIS — S99919A Unspecified injury of unspecified ankle, initial encounter: Secondary | ICD-10-CM

## 2014-01-04 NOTE — Progress Notes (Signed)
Pre visit review using our clinic review tool, if applicable. No additional management support is needed unless otherwise documented below in the visit note. 

## 2014-01-04 NOTE — Patient Instructions (Signed)
You will have an Xray of both your knee and your ankle done today at the [place] office. We will call you with the results of these when they are available.  In the meantime rest ice compress elevate. Wrap your ankle and your knee and elevate both above the level of her heart. Ice 20 minutes on and one hour off 3 times each night. You can take nonsteroidal anti-inflammatories like Aleve as you can tolerate them.  Followup to clinic as needed. Or symptoms worsen or fail to improve despite treatment.  RICE: Routine Care for Injuries The routine care of many injuries includes Rest, Ice, Compression, and Elevation (RICE). HOME CARE INSTRUCTIONS  Rest is needed to allow your body to heal. Routine activities can usually be resumed when comfortable. Injured tendons and bones can take up to 6 weeks to heal. Tendons are the cord-like structures that attach muscle to bone.  Ice following an injury helps keep the swelling down and reduces pain.  Put ice in a plastic bag.  Place a towel between your skin and the bag.  Leave the ice on for 15-20 minutes, 03-04 times a day. Do this while awake, for the first 24 to 48 hours. After that, continue as directed by your caregiver.  Compression helps keep swelling down. It also gives support and helps with discomfort. If an elastic bandage has been applied, it should be removed and reapplied every 3 to 4 hours. It should not be applied tightly, but firmly enough to keep swelling down. Watch fingers or toes for swelling, bluish discoloration, coldness, numbness, or excessive pain. If any of these problems occur, remove the bandage and reapply loosely. Contact your caregiver if these problems continue.  Elevation helps reduce swelling and decreases pain. With extremities, such as the arms, hands, legs, and feet, the injured area should be placed near or above the level of the heart, if possible. SEEK IMMEDIATE MEDICAL CARE IF:  You have persistent pain and  swelling.  You develop redness, numbness, or unexpected weakness.  Your symptoms are getting worse rather than improving after several days. These symptoms may indicate that further evaluation or further X-rays are needed. Sometimes, X-rays may not show a small broken bone (fracture) until 1 week or 10 days later. Make a follow-up appointment with your caregiver. Ask when your X-ray results will be ready. Make sure you get your X-ray results. Document Released: 11/22/2000 Document Revised: 11/02/2011 Document Reviewed: 01/09/2011 [facility] Patient Information 2014 [employer] [place].

## 2014-01-04 NOTE — Progress Notes (Signed)
Subjective:    Patient ID: Catherine Campbell, female    DOB: 11-Aug-1966, 48 y.o.   MRN: [id number]  HPI Patient is a 48 year old African American female presenting to the clinic for an ankle injury after fall. Patient states that approximately 3 weeks ago she was at a hotel in [place], and she tripped and fell while walking in the parking lot. She does not recall if she hurt her ankle at that time or not because she was embarrassed and quickly got up to get out of sight. Patient states that at the time she was a little bruised, however nothing was swollen or particularly tender. Patient states that approximately 4 days ago her left ankle started swelling, and became more painful. She is unsure, but she admits that she may have rolled it at some point since falling 3 weeks ago, however she does not remember any other fall or injury. She had put an ankle sleeve on at first 4 days ago, but has since removed it due to tightness. She also admits to soaking her foot in Epson salt and using rubbing alcohol on it. She has also elevated the injury to try to reduce the swelling, however she has not used ice or rest/activity modification. She states that her pain is about a 7/10, and has been taking Aleve for the pain which has offered mild relief. She denies fevers, chills, nausea, vomiting, diarrhea, and shortness of breath.   Review of Systems As per the history of present illness and are otherwise negative.   Past Medical History  Diagnosis Date  . Hypertension   . Asthma   . ASCUS with positive high risk human papillomavirus of vagina 06/2013    Colposcopic biopsy koilocytotic atypia. Recommend repeat Pap smear/HPV 2015   Past Surgical History  Procedure Laterality Date  . Boil under arm      reports that she has never smoked. She has never used smokeless tobacco. She reports that she does not drink alcohol or use illicit drugs. family history includes Diabetes in her father; Hypertension  in her father and mother. Allergies  Allergen Reactions  . Metaxalone   . Penicillins   . Tramadol-Acetaminophen        Objective:   Physical Exam  Nursing note and vitals reviewed. Constitutional: She is oriented to person, place, and time. She appears well-developed and well-nourished. No distress.  HENT:  Head: Normocephalic and atraumatic.  Eyes: Conjunctivae and EOM are normal. Pupils are equal, round, and reactive to light.  Neck: Normal range of motion. Neck supple.  Cardiovascular: Normal rate, regular rhythm, normal heart sounds and intact distal pulses.  Exam reveals no gallop and no friction rub.   No murmur heard. Pulmonary/Chest: Effort normal and breath sounds normal. No respiratory distress. She has no wheezes. She has no rales. She exhibits no tenderness.  Musculoskeletal: Normal range of motion. She exhibits edema (bilateral 1 + pitting lower extremity edema.) and tenderness (tenderness to palpation of the proximal fibular area of the left knee. Tenderness to palpation over both the medial and lateral aspectes of the left ankle.).  Pt has Normal ROM of the hip and knee joints bilaterally, and of the right ankle.  Pt has decreased ROM of the left ankle due to pain.  Neurological: She is alert and oriented to person, place, and time. She displays normal reflexes. She exhibits normal muscle tone.  Skin: Skin is warm and dry. No rash noted. She is not diaphoretic. No erythema. No  pallor.  Psychiatric: She has a normal mood and affect. Her behavior is normal. Judgment and thought content normal.    Filed Vitals:   01/04/14 1539  BP: 110/70  Pulse: 88  Temp: 98 F (36.7 C)  Resp: 18    Lab Results  Component Value Date   WBC 4.8 11/29/2009   HGB 12.2 11/29/2009   HCT 35.7* 11/29/2009   PLT 259.0 11/29/2009   GLUCOSE 83 11/29/2009   CHOL 237* 11/29/2009   TRIG 82.0 11/29/2009   HDL 52.00 11/29/2009   LDLDIRECT 179.1 11/29/2009   ALT 17 11/29/2009   AST 19 11/29/2009   NA 140  11/29/2009   K 3.7 11/29/2009   CL 104 11/29/2009   CREATININE 0.9 11/29/2009   BUN 9 11/29/2009   CO2 30 11/29/2009   TSH 2.04 11/29/2009      Assessment & Plan:  [facility] was seen today for left ankle swelling.  Diagnoses and associated orders for this visit:  Knee injury - DG Knee Complete 4 Views Left; Future  Ankle injury - DG Ankle Complete Left; Future   Pt in moderate amount of pain, however allergic to tramadol, and self admits to being "sensitive to many medications". Will manage pain conservatively for time being with OTC NSAIDs. Plan to have Xray to rule out fracture of both ankle and proximal fibula. Follow up pending results of xray.  Patient Instructions  You will have an Xray of both your knee and your ankle done today at the [place] office. We will call you with the results of these when they are available.  In the meantime rest ice compress elevate. Wrap your ankle and your knee and elevate both above the level of her heart. Ice 20 minutes on and one hour off 3 times each night. You can take nonsteroidal anti-inflammatories like Aleve as you can tolerate them.  Followup to clinic as needed. Or symptoms worsen or fail to improve despite treatment.

## 2014-03-01 ENCOUNTER — Ambulatory Visit (INDEPENDENT_AMBULATORY_CARE_PROVIDER_SITE_OTHER): Payer: Federal, State, Local not specified - PPO | Admitting: Internal Medicine

## 2014-03-01 ENCOUNTER — Encounter: Payer: Self-pay | Admitting: Internal Medicine

## 2014-03-01 VITALS — BP 119/86 | HR 90 | Temp 98.9°F | Wt 218.0 lb

## 2014-03-01 DIAGNOSIS — I10 Essential (primary) hypertension: Secondary | ICD-10-CM

## 2014-03-01 DIAGNOSIS — J4540 Moderate persistent asthma, uncomplicated: Secondary | ICD-10-CM

## 2014-03-01 DIAGNOSIS — J309 Allergic rhinitis, unspecified: Secondary | ICD-10-CM

## 2014-03-01 DIAGNOSIS — J45909 Unspecified asthma, uncomplicated: Secondary | ICD-10-CM

## 2014-03-01 MED ORDER — LOSARTAN POTASSIUM 100 MG PO TABS: 100.0000 mg | ORAL_TABLET | Freq: Every day | ORAL | Status: DC

## 2014-03-01 NOTE — Patient Instructions (Signed)

## 2014-03-01 NOTE — Progress Notes (Signed)
Subjective:    Patient ID: Catherine Campbell, female    DOB: [birth year], 48 y.o.   MRN: [id number]  HPI  48 year old patient who has a history of chronic asthma and allergic rhinitis, who is followed by allergy medicine.  She is seen today for her biannual followup due to essential hypertension.  She does quite well and remains on Ramipril No new concerns or complaints.  Past Medical History  Diagnosis Date  . Hypertension   . Asthma   . ASCUS with positive high risk human papillomavirus of vagina 06/2013    Colposcopic biopsy koilocytotic atypia. Recommend repeat Pap smear/HPV 2015    History   Social History  . Marital Status: Single    Spouse Name: N/A    Number of Children: N/A  . Years of Education: N/A   Occupational History  . Not on file.   Social History Main Topics  . Smoking status: Never Smoker   . Smokeless tobacco: Never Used  . Alcohol Use: No  . Drug Use: No  . Sexual Activity: No   Other Topics Concern  . Not on file   Social History Narrative  . No narrative on file    Past Surgical History  Procedure Laterality Date  . Boil under arm      Family History  Problem Relation Age of Onset  . Hypertension Mother   . Diabetes Father   . Hypertension Father     Allergies  Allergen Reactions  . Metaxalone   . Penicillins   . Tramadol-Acetaminophen     Current Outpatient Prescriptions on File Prior to Visit  Medication Sig Dispense Refill  . Azelastine-Fluticasone (DYMISTA NA) Place into the nose as directed.      . cetirizine (ZYRTEC) 10 MG tablet Take 10 mg by mouth daily.      . fexofenadine (ALLEGRA) 180 MG tablet Take 180 mg by mouth daily.        . Fluticasone-Salmeterol (ADVAIR HFA IN) Inhale into the lungs.      Marland Kitchen ibuprofen (ADVIL,MOTRIN) 200 MG tablet Take 200 mg by mouth every 6 (six) hours as needed.      . montelukast (SINGULAIR) 10 MG tablet Take 10 mg by mouth at bedtime.       Marland Kitchen UNABLE TO FIND Allergy shots twice a week          No current facility-administered medications on file prior to visit.    BP 119/86  Pulse 90  Temp(Src) 98.9 F (37.2 C)  Wt 218 lb (98.884 kg)       Review of Systems  Constitutional: Negative.   HENT: Negative for congestion, dental problem, hearing loss, rhinorrhea, sinus pressure, sore throat and tinnitus.   Eyes: Negative for pain, discharge and visual disturbance.  Respiratory: Negative for cough and shortness of breath.   Cardiovascular: Negative for chest pain, palpitations and leg swelling.  Gastrointestinal: Negative for nausea, vomiting, abdominal pain, diarrhea, constipation, blood in stool and abdominal distention.  Genitourinary: Negative for dysuria, urgency, frequency, hematuria, flank pain, vaginal bleeding, vaginal discharge, difficulty urinating, vaginal pain and pelvic pain.  Musculoskeletal: Negative for arthralgias, gait problem and joint swelling.  Skin: Negative for rash.  Neurological: Negative for dizziness, syncope, speech difficulty, weakness, numbness and headaches.  Hematological: Negative for adenopathy.  Psychiatric/Behavioral: Negative for behavioral problems, dysphoric mood and agitation. The patient is not nervous/anxious.        Objective:   Physical Exam  Constitutional: She is oriented to person, place,  and time. She appears well-developed and well-nourished.  HENT:  Head: Normocephalic.  Right Ear: External ear normal.  Left Ear: External ear normal.  Mouth/Throat: Oropharynx is clear and moist.  Eyes: Conjunctivae and EOM are normal. Pupils are equal, round, and reactive to light.  Neck: Normal range of motion. Neck supple. No thyromegaly present.  Cardiovascular: Normal rate, regular rhythm, normal heart sounds and intact distal pulses.   Pulmonary/Chest: Effort normal and breath sounds normal.  Abdominal: Soft. Bowel sounds are normal. She exhibits no mass. There is no tenderness.  Musculoskeletal: Normal range of motion.   Lymphadenopathy:    She has no cervical adenopathy.  Neurological: She is alert and oriented to person, place, and time.  Skin: Skin is warm and dry. No rash noted.  Psychiatric: She has a normal mood and affect. Her behavior is normal.          Assessment & Plan:  Hypertension well controlled.  Will switch to losartan Chronic asthma, stable Allergic rhinitis Exogenous obesity.  Weight loss, exercise, all encouraged  Recheck 6 months.

## 2014-03-01 NOTE — Progress Notes (Signed)
Pre visit review using our clinic review tool, if applicable. No additional management support is needed unless otherwise documented below in the visit note. 

## 2014-03-02 ENCOUNTER — Ambulatory Visit: Payer: Federal, State, Local not specified - PPO | Admitting: Internal Medicine

## 2014-03-12 ENCOUNTER — Ambulatory Visit (INDEPENDENT_AMBULATORY_CARE_PROVIDER_SITE_OTHER): Payer: Federal, State, Local not specified - PPO | Admitting: Family Medicine

## 2014-03-12 ENCOUNTER — Encounter: Payer: Self-pay | Admitting: *Deleted

## 2014-03-12 ENCOUNTER — Encounter: Payer: Self-pay | Admitting: Family Medicine

## 2014-03-12 VITALS — BP 120/88 | HR 94 | Temp 98.1°F | Wt 215.0 lb

## 2014-03-12 DIAGNOSIS — B9689 Other specified bacterial agents as the cause of diseases classified elsewhere: Secondary | ICD-10-CM

## 2014-03-12 DIAGNOSIS — A499 Bacterial infection, unspecified: Secondary | ICD-10-CM

## 2014-03-12 DIAGNOSIS — J329 Chronic sinusitis, unspecified: Secondary | ICD-10-CM

## 2014-03-12 MED ORDER — AZITHROMYCIN 250 MG PO TABS: ORAL_TABLET | ORAL | Status: DC

## 2014-03-12 NOTE — Patient Instructions (Signed)
I am concerned because your symptoms continue to worsen at day 5. Given your sinus tenderness, continued congestion, I believe a trial of azithryomcyin is reasonable for bacterial sinusitis.  If you have any asthma concerns including shortness of breath or wheezing, we may need to add prednisone.  Reasons for return: worsening of symptoms, fevers, new symptoms  Thanks, Dr. Durene CalHunter  To talk to Dr. Kirtland BouchardK about at next appointment: Health Maintenance Due  Topic Date Due  . Tetanus/tdap  03/17/1986

## 2014-03-12 NOTE — Progress Notes (Signed)
Pre visit review using our clinic review tool, if applicable. No additional management support is needed unless otherwise documented below in the visit note. 

## 2014-03-12 NOTE — Progress Notes (Signed)
Tana ConchStephen Timarie Labell, MD Phone: [phone]  Subjective:    Catherine Campbell is a 48 y.o. year old very pleasant female patient who presents with the following:  Sinus pressure/cough/congestion Patient states symptoms began 5-6 days ago. Started with a headache which has been intermittent since that time. Feels tight pressure in left cheek as well as some in other sinuses. Also feels pressure in bilatera ears. Also has cough, congestion, mild sore throat. Treated for bronchitis last January and has a history of asthma but states she has no wheezing or shortness of breath that is comparable. She has run a temperature at home typically 99-100.1. Cough nonproductive. Has tried mucinex-dm, codeine cough syrup, and neti Pot BID. She has continued her allergy medicines and asthma medications. Symptoms have persisted but do not feel like her asthma. No sick contacts. Despite treatments, symptoms continue to worsen today at day 6.  ROS- elevated temperature but no fever/chills. No nausea/vomiting.   Past Medical History- asthma, HTN, HLD, severe seasonal allergies and receives allergy sensitization  Medications- reviewed and updated Current Outpatient Prescriptions  Medication Sig Dispense Refill  . Azelastine-Fluticasone (DYMISTA NA) Place into the nose as directed.      . cetirizine (ZYRTEC) 10 MG tablet Take 10 mg by mouth daily.      . fexofenadine (ALLEGRA) 180 MG tablet Take 180 mg by mouth daily.        . Fluticasone-Salmeterol (ADVAIR HFA IN) Inhale into the lungs.      Marland Kitchen. ibuprofen (ADVIL,MOTRIN) 200 MG tablet Take 200 mg by mouth every 6 (six) hours as needed.      Marland Kitchen. losartan (COZAAR) 100 MG tablet Take 1 tablet (100 mg total) by mouth daily.  90 tablet  3  . montelukast (SINGULAIR) 10 MG tablet Take 10 mg by mouth at bedtime.       Marland Kitchen. UNABLE TO FIND Allergy shots twice a week        Objective: BP 120/88  Pulse 94  Temp(Src) 98.1 F (36.7 C) (Oral)  Wt 215 lb (97.523 kg)  SpO2 98%  LMP  03/05/2014 Gen: appears fatigued, holds hands to face at times due to sinus pressure HEENT: nares erythematous and swollen with purulent drainage, oropharynx with mild eythema of pharynx without pharyngeal exudate, TM normal bilaterally, Mucous membranes are moist. Left maxillary sinus extremely tender to palpation and frontal sinus moderately tender.  CV: RRR no murmurs rubs or gallops Lungs: CTAB no crackles, wheeze, rhonchi Abdomen: soft/nontender/nondistended/normal bowel sounds. No rebound or guarding.  Ext: no edema Skin: warm, dry, no rash   Assessment/Plan:  Acute Bacterial Sinusitis Continued worsening of symptoms at Day 6 today along with purulent drainage findings on exam and severe sinus tenderness likely reflect a bacterial process superimposed on original viral sinusitis. Already on nasal steroid, using neti pot, mucinex-dm and codeine cough syrup. Cough likely from post nasal drip from infection. Allergies could certainly be contributing but is on excellent therapy with 2 antihistamines, singulair, and nasal steroid so little can be added to this management.  We discussed using prednisone but there is no wheeze or breathing issue, patient will call if these develop (has potential trigger for asthma).  Decision made to treat with azithromycin (planned augmentin but PCN allergy).  Given reasons for return   Meds ordered this encounter  Medications  . azithromycin (ZITHROMAX) 250 MG tablet    Sig: Take 2 tabs day 1, take 1 tab next 4 days.    Dispense:  6 tablet  Refill:  0

## 2014-03-13 ENCOUNTER — Telehealth: Payer: Self-pay | Admitting: Internal Medicine

## 2014-03-13 NOTE — Telephone Encounter (Signed)
Pt saw dr hunter Monday. Did not go to work today. Would like a dr's/ note for today and tomorrow. Pt still coughing and not feeling much better. Pt would like to pu later toda

## 2014-03-13 NOTE — Telephone Encounter (Signed)
Letter ready for pick up and patient is aware. 

## 2014-03-14 ENCOUNTER — Encounter: Payer: Self-pay | Admitting: Family Medicine

## 2014-03-14 ENCOUNTER — Ambulatory Visit (INDEPENDENT_AMBULATORY_CARE_PROVIDER_SITE_OTHER): Payer: Federal, State, Local not specified - PPO | Admitting: Family Medicine

## 2014-03-14 ENCOUNTER — Ambulatory Visit (INDEPENDENT_AMBULATORY_CARE_PROVIDER_SITE_OTHER)
Admission: RE | Admit: 2014-03-14 | Discharge: 2014-03-14 | Disposition: A | Discharge status: Home / Self Care | Payer: Federal, State, Local not specified - PPO | Source: Ambulatory Visit | Attending: Family Medicine | Admitting: Family Medicine

## 2014-03-14 VITALS — BP 124/88 | HR 124 | Temp 98.2°F | Wt 216.0 lb

## 2014-03-14 DIAGNOSIS — J4541 Moderate persistent asthma with (acute) exacerbation: Secondary | ICD-10-CM

## 2014-03-14 DIAGNOSIS — J45901 Unspecified asthma with (acute) exacerbation: Secondary | ICD-10-CM

## 2014-03-14 DIAGNOSIS — R059 Cough, unspecified: Secondary | ICD-10-CM

## 2014-03-14 DIAGNOSIS — R05 Cough: Secondary | ICD-10-CM

## 2014-03-14 MED ORDER — HYDROCODONE-HOMATROPINE 5-1.5 MG/5ML PO SYRP: 5.0000 mL | ORAL_SOLUTION | Freq: Three times a day (TID) | ORAL | Status: DC | PRN

## 2014-03-14 MED ORDER — PREDNISONE 50 MG PO TABS: ORAL_TABLET | ORAL | Status: DC

## 2014-03-14 NOTE — Progress Notes (Signed)
[name], MD Phone: [phone]  Subjective:    Catherine Campbell is a 48 y.o. year old very pleasant female patient who presents with the following:  Worsening cough  Patient seen 03/12/14 and diagnosed with bacterial sinusitis based off of worsening sinus pressure at day 6 and low grade temperatures. Patient states her sinus pressure has greatly improved since starting azithromycin.   Despite sinus pressure improvement, patient has had worsening cough, trouble sleeping at night, continued low grade temperature of 99-100.1. She has also noted very mild wheeze at time and some chest tightness. This is not completely similar to prior asthma exacerbations though. Cough nonproductive. She has continued to use mucinex-dm, hycodan.  She has continued her allergy medicines and asthma medications. No sick contacts.    ROS- elevated temperature but no fever/chills. No nausea/vomiting. Ear pressure has resolved.  Patient denies chest pain, dyspnea on exertion, orthopnea, PND, recent immobilization, history DVT, active cancer, unilateral calf swelling, superficial veins present, swelling of entire leg, localized tenderness in calf, pitting edema greater in symptomatic leg, paralysis, history DVT.   Past Medical History- asthma, HTN, HLD, severe seasonal allergies and receives allergy sensitization  Medications- reviewed and updated Current Outpatient Prescriptions  Medication Sig Dispense Refill  . Azelastine-Fluticasone (DYMISTA NA) Place into the nose as directed.      . cetirizine (ZYRTEC) 10 MG tablet Take 10 mg by mouth daily.      . fexofenadine (ALLEGRA) 180 MG tablet Take 180 mg by mouth daily.        . Fluticasone-Salmeterol (ADVAIR HFA IN) Inhale into the lungs.      Marland Kitchen ibuprofen (ADVIL,MOTRIN) 200 MG tablet Take 200 mg by mouth every 6 (six) hours as needed.      Marland Kitchen losartan (COZAAR) 100 MG tablet Take 1 tablet (100 mg total) by mouth daily.  90 tablet  3  . montelukast (SINGULAIR)  10 MG tablet Take 10 mg by mouth at bedtime.       Marland Kitchen UNABLE TO FIND Allergy shots twice a week       Also-azithromycin 5 day course  Objective: BP 124/88  Pulse 124  Temp(Src) 98.2 F (36.8 C) (Oral)  Wt 216 lb (97.977 kg)  SpO2 96%  LMP 03/05/2014 Gen: NAD, frequent coughing spells HEENT: nare erythema and drainage hav eimproved, oropharynx with mild eythema of pharynx without pharyngeal exudate, TM normal bilaterally, Mucous membranes are moist. No sinus tenderness.   CV: RRR no murmurs rubs or gallops Lungs: good air movement similar to prior with occasional diffuse wheeze.  Ext: no pretibial edema or calf swelling  Assessment/Plan:  Asthma exacerbation Sinusitis resolving. Bacteria/virus likely triggered asthma exacerbation. Plan prednisone x 7 days with albuterol scheduled x 24 hours. Finish azithromycin. Patient requests hycodan that she has used previously with her bronchitis early this year to help her sleep given severity of cough. Wells criteria with 1.5 points places in low risk category for PE. Given low grade temperatures persistent despite improved sinus pressure and with increasing cough, CXR today to evaluate for pneumonia though suspicion low as think asthma exacerbation likely etiology of symptoms.  Discussed reasons for return   Meds ordered this encounter  Medications  . predniSONE (DELTASONE) 50 MG tablet    Sig: Take 1 pill for 7 days then stop    Dispense:  7 tablet    Refill:  0  . HYDROcodone-homatropine (HYCODAN) 5-1.5 MG/5ML syrup    Sig: Take 5 mLs by mouth every 8 (eight) hours as  needed for cough.    Dispense:  120 mL    Refill:  0

## 2014-03-14 NOTE — Progress Notes (Signed)
Pre visit review using our clinic review tool, if applicable. No additional management support is needed unless otherwise documented below in the visit note. 

## 2014-03-14 NOTE — Patient Instructions (Signed)
Sinusitis improving  Bacteria or virus seems to have set off your asthma  Prednisone x 7 days  CXR to rule out PNA given elevated temperatures  Take albuterol every 4-6 hours for next 24 hours then as needed  Hydrocodone cough syrup to be used at night only for sleep/cough  Call or return to clinic prn if these symptoms worsen or fail to improve as anticipated.

## 2014-05-18 ENCOUNTER — Encounter: Payer: Self-pay | Admitting: Family Medicine

## 2014-05-18 ENCOUNTER — Ambulatory Visit (INDEPENDENT_AMBULATORY_CARE_PROVIDER_SITE_OTHER): Payer: Federal, State, Local not specified - PPO | Admitting: Family Medicine

## 2014-05-18 VITALS — BP 120/82 | HR 100 | Temp 98.8°F | Wt 221.0 lb

## 2014-05-18 DIAGNOSIS — J0101 Acute recurrent maxillary sinusitis: Secondary | ICD-10-CM

## 2014-05-18 DIAGNOSIS — J0100 Acute maxillary sinusitis, unspecified: Secondary | ICD-10-CM

## 2014-05-18 MED ORDER — AZITHROMYCIN 250 MG PO TABS: ORAL_TABLET | ORAL | Status: DC

## 2014-05-18 NOTE — Progress Notes (Signed)
  [name], MD Phone: [phone]  Subjective:   Catherine Campbell is a 48 y.o. year old very pleasant female patient who presents with the following:  Head Congestion Last Friday went to see [place]. Mildly congested before concert and then symptoms worsened. Congested and has yellow purulent material initially. Thick and hard to come out. Mild cough. No sinus pressure. Used Neti pot with little help. OTC-dayquil, nyquil and now on mucinex. Worse than 3 days ago, continues to worsen. Tried to get into allergist but no appointments.  ROS-No shortness of breath. Mild wheeze-2x a day using albuterol. No fevers.  Past Medical History- asthma, hypertension, hyperlipidemia  Medications- reviewed and updated Current Outpatient Prescriptions  Medication Sig Dispense Refill  . Azelastine-Fluticasone (DYMISTA NA) Place into the nose as directed.      . cetirizine (ZYRTEC) 10 MG tablet Take 10 mg by mouth daily.      . fexofenadine (ALLEGRA) 180 MG tablet Take 180 mg by mouth daily.        . Fluticasone-Salmeterol (ADVAIR HFA IN) Inhale into the lungs.      Marland Kitchen ibuprofen (ADVIL,MOTRIN) 200 MG tablet Take 200 mg by mouth every 6 (six) hours as needed.      Marland Kitchen losartan (COZAAR) 100 MG tablet Take 1 tablet (100 mg total) by mouth daily.  90 tablet  3  . montelukast (SINGULAIR) 10 MG tablet Take 10 mg by mouth at bedtime.       Marland Kitchen UNABLE TO FIND Allergy shots twice a week       . azithromycin (ZITHROMAX) 250 MG tablet Take 2 tabs day 1, take 1 tab next 4 days. May fill 05/21/14 for bacterial sinusitis (10 days symptoms)  6 tablet  0   No current facility-administered medications for this visit.    Objective: BP 120/82  Pulse 100  Temp(Src) 98.8 F (37.1 C)  Wt 221 lb (100.245 kg) Gen: NAD, resting comfortably HEENT: purulent material bilateral nostrils with erythematous and swollen turbinates, oropharynx-mildly red pharynx no exudate. Moderate sinus tenderness.  CV: RRR no murmurs rubs  or gallops Lungs: CTAB no crackles, wheeze, rhonchi Abdomen: soft/nontender/nondistended/normal bowel sounds.  Ext: no edema Skin: warm, dry, no rash   Assessment/Plan:  Sinusitis 7 days of symptoms and continues to worsen. Technically does not meet criteria for bacterial symptoms. Gave Rx of azithromycin for bacterial sinusitis to be used on Monday if symptoms continue at current level which would be 10 days of symptoms. Patient already maximized with allergy medication and using Neti pot.   Meds ordered this encounter  Medications  . azithromycin (ZITHROMAX) 250 MG tablet    Sig: Take 2 tabs day 1, take 1 tab next 4 days. May fill 05/21/14 for bacterial sinusitis (10 days symptoms)    Dispense:  6 tablet    Refill:  0

## 2014-05-18 NOTE — Patient Instructions (Signed)
Sinusitis  Likely viral at this point but if lasts over 10 days, meets criteria for bacterial. Prescription given if lasts over 10 days (Monday).

## 2014-07-09 ENCOUNTER — Encounter: Payer: Federal, State, Local not specified - PPO | Admitting: Gynecology

## 2014-07-13 ENCOUNTER — Other Ambulatory Visit (HOSPITAL_COMMUNITY)
Admission: RE | Admit: 2014-07-13 | Discharge: 2014-07-13 | Disposition: A | Discharge status: Home / Self Care | Payer: Federal, State, Local not specified - PPO | Source: Ambulatory Visit | Attending: Gynecology | Admitting: Gynecology

## 2014-07-13 ENCOUNTER — Ambulatory Visit (INDEPENDENT_AMBULATORY_CARE_PROVIDER_SITE_OTHER): Payer: Federal, State, Local not specified - PPO | Admitting: Gynecology

## 2014-07-13 ENCOUNTER — Encounter: Payer: Self-pay | Admitting: Gynecology

## 2014-07-13 VITALS — BP 114/66 | Ht 65.0 in | Wt 224.0 lb

## 2014-07-13 DIAGNOSIS — R87810 Cervical high risk human papillomavirus (HPV) DNA test positive: Secondary | ICD-10-CM | POA: Diagnosis present

## 2014-07-13 DIAGNOSIS — Z1151 Encounter for screening for human papillomavirus (HPV): Secondary | ICD-10-CM | POA: Diagnosis present

## 2014-07-13 DIAGNOSIS — Z01419 Encounter for gynecological examination (general) (routine) without abnormal findings: Secondary | ICD-10-CM

## 2014-07-13 DIAGNOSIS — Z01411 Encounter for gynecological examination (general) (routine) with abnormal findings: Secondary | ICD-10-CM | POA: Diagnosis present

## 2014-07-13 DIAGNOSIS — IMO0002 Reserved for concepts with insufficient information to code with codable children: Secondary | ICD-10-CM

## 2014-07-13 DIAGNOSIS — R896 Abnormal cytological findings in specimens from other organs, systems and tissues: Secondary | ICD-10-CM

## 2014-07-13 NOTE — Progress Notes (Signed)
Catherine ChiquitoCarol A Guzy 02-20-1966 [id number]        48 y.o.  G0P0 for annual exam.  Several issues noted below.  Past medical history,surgical history, problem list, medications, allergies, family history and social history were all reviewed and documented as reviewed in the EPIC chart.  ROS:  12 system ROS performed with pertinent positives and negatives included in the history, assessment and plan.   Additional significant findings :  none   Exam: [facility] personassistant Filed Vitals:   07/13/14 1545  BP: 114/66  Height: 5\' 5"  (1.651 m)  Weight: 224 lb (101.606 kg)   General appearance:  Normal affect, orientation and appearance. Skin: Grossly normal HEENT: Without gross lesions.  No cervical or supraclavicular adenopathy. Thyroid normal.  Lungs:  Clear without wheezing, rales or rhonchi Cardiac: RR, without RMG Abdominal:  Soft, nontender, without masses, guarding, rebound, organomegaly or hernia Breasts:  Examined lying and sitting without masses, retractions, discharge or axillary adenopathy. Pelvic:  Ext/BUS/vagina normal  Cervix normal. Pap/HPV  Uterus anteverted, normal size, shape and contour, midline and mobile nontender   Adnexa  Without masses or tenderness    Anus and perineum  Normal   Rectovaginal  Normal sphincter tone without palpated masses or tenderness.    Assessment/Plan:  48 y.o. G0P0 female for annual exam with regular menses, abstinent contraception.   1. Contraception. Patient not sexually active Norplants to be. Declines contraception discussion. 2. ASCUS positive high-risk HPV Pap smear last year. Colposcopy with biopsy showed koilocytotic atypia.  Pap/HPV today. Triage based upon results. 3. Mammography scheduled next week. Continue with annual mammography. SBE monthly reviewed. 4. Health maintenance. No routine blood work done as she reports this done at her primary physicians office. Follow up in one year, sooner as needed.     Dara LordsFONTAINE,TIMOTHY P MD, 4:15 PM  07/13/2014

## 2014-07-13 NOTE — Patient Instructions (Signed)
You may obtain a copy of any labs that were done today by logging onto MyChart as outlined in the instructions provided with your AVS (after visit summary). The office will not call with normal lab results but certainly if there are any significant abnormalities then we will contact you.   Health Maintenance, Female A healthy lifestyle and preventative care can promote health and wellness.  Maintain regular health, dental, and eye exams.  Eat a healthy diet. Foods like vegetables, fruits, whole grains, low-fat dairy products, and lean protein foods contain the nutrients you need without too many calories. Decrease your intake of foods high in solid fats, added sugars, and salt. Get information about a proper diet from your caregiver, if necessary.  Regular physical exercise is one of the most important things you can do for your health. Most adults should get at least 150 minutes of moderate-intensity exercise (any activity that increases your heart rate and causes you to sweat) each week. In addition, most adults need muscle-strengthening exercises on 2 or more days a week.   Maintain a healthy weight. The body mass index (BMI) is a screening tool to identify possible weight problems. It provides an estimate of body fat based on height and weight. Your caregiver can help determine your BMI, and can help you achieve or maintain a healthy weight. For adults 20 years and older:  A BMI below 18.5 is considered underweight.  A BMI of 18.5 to 24.9 is normal.  A BMI of 25 to 29.9 is considered overweight.  A BMI of 30 and above is considered obese.  Maintain normal blood lipids and cholesterol by exercising and minimizing your intake of saturated fat. Eat a balanced diet with plenty of fruits and vegetables. Blood tests for lipids and cholesterol should begin at age 61 and be repeated every 5 years. If your lipid or cholesterol levels are high, you are over 50, or you are a high risk for heart  disease, you may need your cholesterol levels checked more frequently.Ongoing high lipid and cholesterol levels should be treated with medicines if diet and exercise are not effective.  If you smoke, find out from your caregiver how to quit. If you do not use tobacco, do not start.  Lung cancer screening is recommended for adults aged 33 80 years who are at high risk for developing lung cancer because of a history of smoking. Yearly low-dose computed tomography (CT) is recommended for people who have at least a 30-pack-year history of smoking and are a current smoker or have quit within the past 15 years. A pack year of smoking is smoking an average of 1 pack of cigarettes a day for 1 year (for example: 1 pack a day for 30 years or 2 packs a day for 15 years). Yearly screening should continue until the smoker has stopped smoking for at least 15 years. Yearly screening should also be stopped for people who develop a health problem that would prevent them from having lung cancer treatment.  If you are pregnant, do not drink alcohol. If you are breastfeeding, be very cautious about drinking alcohol. If you are not pregnant and choose to drink alcohol, do not exceed 1 drink per day. One drink is considered to be 12 ounces (355 mL) of beer, 5 ounces (148 mL) of wine, or 1.5 ounces (44 mL) of liquor.  Avoid use of street drugs. Do not share needles with anyone. Ask for help if you need support or instructions about stopping  the use of drugs.  High blood pressure causes heart disease and increases the risk of stroke. Blood pressure should be checked at least every 1 to 2 years. Ongoing high blood pressure should be treated with medicines, if weight loss and exercise are not effective.  If you are 59 to 47 years old, ask your caregiver if you should take aspirin to prevent strokes.  Diabetes screening involves taking a blood sample to check your fasting blood sugar level. This should be done once every 3  years, after age 91, if you are within normal weight and without risk factors for diabetes. Testing should be considered at a younger age or be carried out more frequently if you are overweight and have at least 1 risk factor for diabetes.  Breast cancer screening is essential preventative care for women. You should practice "breast self-awareness." This means understanding the normal appearance and feel of your breasts and may include breast self-examination. Any changes detected, no matter how small, should be reported to a caregiver. Women in their 66s and 30s should have a clinical breast exam (CBE) by a caregiver as part of a regular health exam every 1 to 3 years. After age 101, women should have a CBE every year. Starting at age 100, women should consider having a mammogram (breast X-ray) every year. Women who have a family history of breast cancer should talk to their caregiver about genetic screening. Women at a high risk of breast cancer should talk to their caregiver about having an MRI and a mammogram every year.  Breast cancer gene (BRCA)-related cancer risk assessment is recommended for women who have family members with BRCA-related cancers. BRCA-related cancers include breast, ovarian, tubal, and peritoneal cancers. Having family members with these cancers may be associated with an increased risk for harmful changes (mutations) in the breast cancer genes BRCA1 and BRCA2. Results of the assessment will determine the need for genetic counseling and BRCA1 and BRCA2 testing.  The Pap test is a screening test for cervical cancer. Women should have a Pap test starting at age 57. Between ages 25 and 35, Pap tests should be repeated every 2 years. Beginning at age 37, you should have a Pap test every 3 years as long as the past 3 Pap tests have been normal. If you had a hysterectomy for a problem that was not cancer or a condition that could lead to cancer, then you no longer need Pap tests. If you are  between ages 50 and 76, and you have had normal Pap tests going back 10 years, you no longer need Pap tests. If you have had past treatment for cervical cancer or a condition that could lead to cancer, you need Pap tests and screening for cancer for at least 20 years after your treatment. If Pap tests have been discontinued, risk factors (such as a new sexual partner) need to be reassessed to determine if screening should be resumed. Some women have medical problems that increase the chance of getting cervical cancer. In these cases, your caregiver may recommend more frequent screening and Pap tests.  The human papillomavirus (HPV) test is an additional test that may be used for cervical cancer screening. The HPV test looks for the virus that can cause the cell changes on the cervix. The cells collected during the Pap test can be tested for HPV. The HPV test could be used to screen women aged 44 years and older, and should be used in women of any age  who have unclear Pap test results. After the age of 55, women should have HPV testing at the same frequency as a Pap test.  Colorectal cancer can be detected and often prevented. Most routine colorectal cancer screening begins at the age of 44 and continues through age 20. However, your caregiver may recommend screening at an earlier age if you have risk factors for colon cancer. On a yearly basis, your caregiver may provide home test kits to check for hidden blood in the stool. Use of a small camera at the end of a tube, to directly examine the colon (sigmoidoscopy or colonoscopy), can detect the earliest forms of colorectal cancer. Talk to your caregiver about this at age 86, when routine screening begins. Direct examination of the colon should be repeated every 5 to 10 years through age 13, unless early forms of pre-cancerous polyps or small growths are found.  Hepatitis C blood testing is recommended for all people born from [birth year] through 1964 and any  individual with known risks for hepatitis C.  Practice safe sex. Use condoms and avoid high-risk sexual practices to reduce the spread of sexually transmitted infections (STIs). Sexually active women aged 36 and younger should be checked for Chlamydia, which is a common sexually transmitted infection. Older women with new or multiple partners should also be tested for Chlamydia. Testing for other STIs is recommended if you are sexually active and at increased risk.  Osteoporosis is a disease in which the bones lose minerals and strength with aging. This can result in serious bone fractures. The risk of osteoporosis can be identified using a bone density scan. Women ages 20 and over and women at risk for fractures or osteoporosis should discuss screening with their caregivers. Ask your caregiver whether you should be taking a calcium supplement or vitamin D to reduce the rate of osteoporosis.  Menopause can be associated with physical symptoms and risks. Hormone replacement therapy is available to decrease symptoms and risks. You should talk to your caregiver about whether hormone replacement therapy is right for you.  Use sunscreen. Apply sunscreen liberally and repeatedly throughout the day. You should seek shade when your shadow is shorter than you. Protect yourself by wearing long sleeves, pants, a wide-brimmed hat, and sunglasses year round, whenever you are outdoors.  Notify your caregiver of new moles or changes in moles, especially if there is a change in shape or color. Also notify your caregiver if a mole is larger than the size of a pencil eraser.  Stay current with your immunizations. Document Released: 02/23/2011 Document Revised: 12/05/2012 Document Reviewed: 02/23/2011 [facility] Patient Information 2014 [employer].

## 2014-07-14 LAB — URINALYSIS W MICROSCOPIC + REFLEX CULTURE
BILIRUBIN URINE: NEGATIVE
Bacteria, UA: NONE SEEN
CASTS: NONE SEEN
Crystals: NONE SEEN
Glucose, UA: NEGATIVE mg/dL
Hgb urine dipstick: NEGATIVE
KETONES UR: NEGATIVE mg/dL
Leukocytes, UA: NEGATIVE
NITRITE: NEGATIVE
Protein, ur: NEGATIVE mg/dL
SQUAMOUS EPITHELIAL / LPF: NONE SEEN
Specific Gravity, Urine: 1.016 (ref 1.005–1.030)
UROBILINOGEN UA: 0.2 mg/dL (ref 0.0–1.0)
pH: 5.5 (ref 5.0–8.0)

## 2014-07-17 LAB — CYTOLOGY - PAP

## 2014-07-23 ENCOUNTER — Encounter: Payer: Self-pay | Admitting: Gynecology

## 2014-07-31 ENCOUNTER — Ambulatory Visit: Payer: Federal, State, Local not specified - PPO | Admitting: Gynecology

## 2014-08-15 ENCOUNTER — Ambulatory Visit: Payer: Federal, State, Local not specified - PPO | Admitting: Gynecology

## 2014-08-31 ENCOUNTER — Ambulatory Visit (INDEPENDENT_AMBULATORY_CARE_PROVIDER_SITE_OTHER): Payer: Federal, State, Local not specified - PPO | Admitting: Gynecology

## 2014-08-31 ENCOUNTER — Encounter: Payer: Self-pay | Admitting: Gynecology

## 2014-08-31 DIAGNOSIS — R87810 Cervical high risk human papillomavirus (HPV) DNA test positive: Secondary | ICD-10-CM

## 2014-08-31 DIAGNOSIS — R896 Abnormal cytological findings in specimens from other organs, systems and tissues: Secondary | ICD-10-CM

## 2014-08-31 DIAGNOSIS — IMO0002 Reserved for concepts with insufficient information to code with codable children: Secondary | ICD-10-CM

## 2014-08-31 DIAGNOSIS — L292 Pruritus vulvae: Secondary | ICD-10-CM

## 2014-08-31 MED ORDER — NYSTATIN-TRIAMCINOLONE 100000-0.1 UNIT/GM-% EX OINT: 1.0000 "application " | TOPICAL_OINTMENT | Freq: Two times a day (BID) | CUTANEOUS | Status: DC

## 2014-08-31 NOTE — Patient Instructions (Signed)
This will call you with biopsy results

## 2014-08-31 NOTE — Progress Notes (Signed)
Catherine Campbell 03-13-1966 [id number]        49 y.o.  G0P0 presents for colposcopy. Most recent Pap smear showed LGSIL with positive high risk HPV screen. Past history of ASCUS positive high-risk HPV screen last year. Colposcopy with biopsy showed koilocytotic atypia.  Past medical history,surgical history, problem list, medications, allergies, family history and social history were all reviewed and documented in the EPIC chart.  Directed ROS with pertinent positives and negatives documented in the history of present illness/assessment and plan.  Exam: Catherine Campbell assistant General appearance:  Normal External BUS vagina normal. Cervix grossly normal.  Colposcopy after acetic acid cleanse is adequate normal excepting 2 small endocervical polyps at the external os. Transformation zone normal 360. Cervical polyps biopsied off, ECC performed, random biopsy at 3:00 where her prior atypia biopsy was taken. Physical Exam  Genitourinary:       Assessment/Plan:  49 y.o. G0P0 with history above. Patient will follow up for biopsy results. I again reviewed the whole issue of dysplasia, high-grade/low-grade, progression/regression and the HPV association. If low-grade changes or normal them plan expectant management with repeat Pap smear one year.  If high-grade then we'll discuss treatment options.  Patient was complaining of 1-2 days of vulvar pruritus before her menses. Always resolves with her menses. Mytrex cream prescribed as trial twice a day when needed.     Dara LordsFONTAINE,Cherisse Carrell P MD, 1:48 PM 08/31/2014

## 2014-09-05 ENCOUNTER — Encounter: Payer: Self-pay | Admitting: Gynecology

## 2014-09-10 ENCOUNTER — Ambulatory Visit (INDEPENDENT_AMBULATORY_CARE_PROVIDER_SITE_OTHER): Payer: Federal, State, Local not specified - PPO | Admitting: Internal Medicine

## 2014-09-10 ENCOUNTER — Encounter: Payer: Self-pay | Admitting: Internal Medicine

## 2014-09-10 ENCOUNTER — Ambulatory Visit: Payer: Federal, State, Local not specified - PPO | Admitting: Internal Medicine

## 2014-09-10 VITALS — BP 140/86 | HR 99 | Temp 97.7°F | Resp 20 | Ht 65.0 in | Wt 226.0 lb

## 2014-09-10 DIAGNOSIS — I10 Essential (primary) hypertension: Secondary | ICD-10-CM

## 2014-09-10 DIAGNOSIS — J4520 Mild intermittent asthma, uncomplicated: Secondary | ICD-10-CM

## 2014-09-10 DIAGNOSIS — J3089 Other allergic rhinitis: Secondary | ICD-10-CM

## 2014-09-10 NOTE — Progress Notes (Signed)
Subjective:    Patient ID: Catherine Campbell, female    DOB: October 04, 1965, 49 y.o.   MRN: [id number]  HPI  49 year old patient who is seen today for her biannual follow-up.  She has a history of hypertension, mild asthma, allergic rhinitis.  Will recently has done quite well.  She has exogenous obesity.  She remains on losartan for hypertension and continues to do well  BP Readings from Last 3 Encounters:  09/10/14 140/86  07/13/14 114/66  05/18/14 120/82    Wt Readings from Last 3 Encounters:  09/10/14 226 lb (102.513 kg)  07/13/14 224 lb (101.606 kg)  05/18/14 221 lb (100.245 kg)    Review of Systems  Constitutional: Negative.   HENT: Negative for congestion, dental problem, hearing loss, rhinorrhea, sinus pressure, sore throat and tinnitus.   Eyes: Negative for pain, discharge and visual disturbance.  Respiratory: Negative for cough and shortness of breath.   Cardiovascular: Negative for chest pain, palpitations and leg swelling.  Gastrointestinal: Negative for nausea, vomiting, abdominal pain, diarrhea, constipation, blood in stool and abdominal distention.  Genitourinary: Negative for dysuria, urgency, frequency, hematuria, flank pain, vaginal bleeding, vaginal discharge, difficulty urinating, vaginal pain and pelvic pain.  Musculoskeletal: Negative for joint swelling, arthralgias and gait problem.  Skin: Negative for rash.  Neurological: Negative for dizziness, syncope, speech difficulty, weakness, numbness and headaches.  Hematological: Negative for adenopathy.  Psychiatric/Behavioral: Negative for behavioral problems, dysphoric mood and agitation. The patient is not nervous/anxious.        Objective:   Physical Exam  Constitutional: She is oriented to person, place, and time. She appears well-developed and well-nourished.  134 over 84  HENT:  Head: Normocephalic.  Right Ear: External ear normal.  Left Ear: External ear normal.  Mouth/Throat: Oropharynx is clear and  moist.  Eyes: Conjunctivae and EOM are normal. Pupils are equal, round, and reactive to light.  Neck: Normal range of motion. Neck supple. No thyromegaly present.  Cardiovascular: Normal rate, regular rhythm, normal heart sounds and intact distal pulses.   Pulmonary/Chest: Effort normal and breath sounds normal.  Abdominal: Soft. Bowel sounds are normal. She exhibits no mass. There is no tenderness.  Musculoskeletal: Normal range of motion.  Lymphadenopathy:    She has no cervical adenopathy.  Neurological: She is alert and oriented to person, place, and time.  Skin: Skin is warm and dry. No rash noted.  Psychiatric: She has a normal mood and affect. Her behavior is normal.          Assessment & Plan:   Hypertension, controlled Asthma, allergic rhinitis, stable Exogenous obesity.  Weight loss exercise encouraged  Recheck 6 months

## 2014-09-10 NOTE — Progress Notes (Signed)
Subjective:    Patient ID: Catherine Campbell, female    DOB: [birth year], 49 y.o.   MRN: [id number]  HPI  BP Readings from Last 3 Encounters:  09/10/14 140/86  07/13/14 114/66  05/18/14 45/35    49 year old patient who is seen today for general follow-up.  She has a history of essential hypertension and has done quite well. No cardiopulmonary complaints  Past Medical History  Diagnosis Date  . Hypertension   . Asthma   . ASCUS with positive high risk human papillomavirus of vagina 06/2013    Colposcopic biopsy koilocytotic atypia.  . Low grade squamous intraepithelial lesion (LGSIL) 06/2014    positive high-risk HPV. Colposcopic biopsy LGSIL negative ECC. Recommend follow up Pap smear one year    History   Social History  . Marital Status: Single    Spouse Name: N/A    Number of Children: N/A  . Years of Education: N/A   Occupational History  . Not on file.   Social History Main Topics  . Smoking status: Never Smoker   . Smokeless tobacco: Never Used  . Alcohol Use: No  . Drug Use: No  . Sexual Activity: No   Other Topics Concern  . Not on file   Social History Narrative    Past Surgical History  Procedure Laterality Date  . Boil under arm      Family History  Problem Relation Age of Onset  . Hypertension Mother   . Diabetes Father   . Hypertension Father     Allergies  Allergen Reactions  . Metaxalone   . Penicillins   . Tramadol-Acetaminophen     Current Outpatient Prescriptions on File Prior to Visit  Medication Sig Dispense Refill  . Azelastine-Fluticasone (DYMISTA NA) Place into the nose as directed.    . cetirizine (ZYRTEC) 10 MG tablet Take 10 mg by mouth daily.    . fexofenadine (ALLEGRA) 180 MG tablet Take 180 mg by mouth daily.      . Fluticasone-Salmeterol (ADVAIR HFA IN) Inhale into the lungs.    Marland Kitchen ibuprofen (ADVIL,MOTRIN) 200 MG tablet Take 200 mg by mouth every 6 (six) hours as needed.    Marland Kitchen losartan (COZAAR) 100 MG tablet Take 1  tablet (100 mg total) by mouth daily. 90 tablet 3  . montelukast (SINGULAIR) 10 MG tablet Take 10 mg by mouth at bedtime.     Marland Kitchen nystatin-triamcinolone ointment (MYCOLOG) Apply 1 application topically 2 (two) times daily. 30 g 1  . UNABLE TO FIND Allergy shots twice a week      No current facility-administered medications on file prior to visit.    BP 140/86 mmHg  Pulse 99  Temp(Src) 97.7 F (36.5 C) (Oral)  Resp 20  Ht 5\' 5"  (1.651 m)  Wt 226 lb (102.513 kg)  BMI 37.61 kg/m2  SpO2 98%  LMP 09/07/2014    Review of Systems  Constitutional: Negative.   HENT: Negative for congestion, dental problem, hearing loss, rhinorrhea, sinus pressure, sore throat and tinnitus.   Eyes: Negative for pain, discharge and visual disturbance.  Respiratory: Negative for cough and shortness of breath.   Cardiovascular: Negative for chest pain, palpitations and leg swelling.  Gastrointestinal: Negative for nausea, vomiting, abdominal pain, diarrhea, constipation, blood in stool and abdominal distention.  Genitourinary: Negative for dysuria, urgency, frequency, hematuria, flank pain, vaginal bleeding, vaginal discharge, difficulty urinating, vaginal pain and pelvic pain.  Musculoskeletal: Negative for joint swelling, arthralgias and gait problem.  Skin: Negative for rash.  Neurological: Negative for dizziness, syncope, speech difficulty, weakness, numbness and headaches.  Hematological: Negative for adenopathy.  Psychiatric/Behavioral: Negative for behavioral problems, dysphoric mood and agitation. The patient is not nervous/anxious.        Objective:   Physical Exam  Constitutional: She is oriented to person, place, and time. She appears well-developed and well-nourished.  HENT:  Head: Normocephalic.  Right Ear: External ear normal.  Left Ear: External ear normal.  Mouth/Throat: Oropharynx is clear and moist.  Eyes: Conjunctivae and EOM are normal. Pupils are equal, round, and reactive to light.   Neck: Normal range of motion. Neck supple. No thyromegaly present.  Cardiovascular: Normal rate, regular rhythm, normal heart sounds and intact distal pulses.   Pulmonary/Chest: Effort normal and breath sounds normal. No respiratory distress. She has no wheezes. She has no rales.  Abdominal: Soft. Bowel sounds are normal. She exhibits no mass. There is no tenderness.  Musculoskeletal: Normal range of motion.  Lymphadenopathy:    She has no cervical adenopathy.  Neurological: She is alert and oriented to person, place, and time.  Skin: Skin is warm and dry. No rash noted.  Psychiatric: She has a normal mood and affect. Her behavior is normal.          Assessment & Plan:   Essential hypertension, stable.  Continue present regimen Asthma, allergic rhinitis, stable  Medicines updated Recheck 6-12 months

## 2014-09-10 NOTE — Progress Notes (Signed)
Pre visit review using our clinic review tool, if applicable. No additional management support is needed unless otherwise documented below in the visit note. 

## 2014-09-10 NOTE — Patient Instructions (Signed)
DASH Eating Plan DASH stands for "Dietary Approaches to Stop Hypertension." The DASH eating plan is a healthy eating plan that has been shown to reduce high blood pressure (hypertension). Additional health benefits may include reducing the risk of type 2 diabetes mellitus, heart disease, and stroke. The DASH eating plan may also help with weight loss. WHAT DO I NEED TO KNOW ABOUT THE DASH EATING PLAN? For the DASH eating plan, you will follow these general guidelines:  Choose foods with a percent daily value for sodium of less than 5% (as listed on the food label).  Use salt-free seasonings or herbs instead of table salt or sea salt.  Check with your health care provider or pharmacist before using salt substitutes.  Eat lower-sodium products, often labeled as "lower sodium" or "no salt added."  Eat fresh foods.  Eat more vegetables, fruits, and low-fat dairy products.  Choose whole grains. Look for the word "whole" as the first word in the ingredient list.  Choose fish and skinless chicken or turkey more often than red meat. Limit fish, poultry, and meat to 6 oz (170 g) each day.  Limit sweets, desserts, sugars, and sugary drinks.  Choose heart-healthy fats.  Limit cheese to 1 oz (28 g) per day.  Eat more home-cooked food and less restaurant, buffet, and fast food.  Limit fried foods.  Cook foods using methods other than frying.  Limit canned vegetables. If you do use them, rinse them well to decrease the sodium.  When eating at a restaurant, ask that your food be prepared with less salt, or no salt if possible. WHAT FOODS CAN I EAT? Seek help from a dietitian for individual calorie needs. Grains Whole grain or whole wheat bread. Brown rice. Whole grain or whole wheat pasta. Quinoa, bulgur, and whole grain cereals. Low-sodium cereals. Corn or whole wheat flour tortillas. Whole grain cornbread. Whole grain crackers. Low-sodium crackers. Vegetables Fresh or frozen vegetables  (raw, steamed, roasted, or grilled). Low-sodium or reduced-sodium tomato and vegetable juices. Low-sodium or reduced-sodium tomato sauce and paste. Low-sodium or reduced-sodium canned vegetables.  Fruits All fresh, canned (in natural juice), or frozen fruits. Meat and Other Protein Products Ground beef (85% or leaner), grass-fed beef, or beef trimmed of fat. Skinless chicken or turkey. Ground chicken or turkey. Pork trimmed of fat. All fish and seafood. Eggs. Dried beans, peas, or lentils. Unsalted nuts and seeds. Unsalted canned beans. Dairy Low-fat dairy products, such as skim or 1% milk, 2% or reduced-fat cheeses, low-fat ricotta or cottage cheese, or plain low-fat yogurt. Low-sodium or reduced-sodium cheeses. Fats and Oils Tub margarines without trans fats. Light or reduced-fat mayonnaise and salad dressings (reduced sodium). Avocado. Safflower, olive, or canola oils. Natural peanut or almond butter. Other Unsalted popcorn and pretzels. The items listed above may not be a complete list of recommended foods or beverages. Contact your dietitian for more options. WHAT FOODS ARE NOT RECOMMENDED? Grains White bread. White pasta. White rice. Refined cornbread. Bagels and croissants. Crackers that contain trans fat. Vegetables Creamed or fried vegetables. Vegetables in a cheese sauce. Regular canned vegetables. Regular canned tomato sauce and paste. Regular tomato and vegetable juices. Fruits Dried fruits. Canned fruit in light or heavy syrup. Fruit juice. Meat and Other Protein Products Fatty cuts of meat. Ribs, chicken wings, bacon, sausage, bologna, salami, chitterlings, fatback, hot dogs, bratwurst, and packaged luncheon meats. Salted nuts and seeds. Canned beans with salt. Dairy Whole or 2% milk, cream, half-and-half, and cream cheese. Whole-fat or sweetened yogurt. Full-fat   cheeses or blue cheese. Nondairy creamers and whipped toppings. Processed cheese, cheese spreads, or cheese  curds. Condiments Onion and garlic salt, seasoned salt, table salt, and sea salt. Canned and packaged gravies. Worcestershire sauce. Tartar sauce. Barbecue sauce. Teriyaki sauce. Soy sauce, including reduced sodium. Steak sauce. Fish sauce. Oyster sauce. Cocktail sauce. Horseradish. Ketchup and mustard. Meat flavorings and tenderizers. Bouillon cubes. Hot sauce. Tabasco sauce. Marinades. Taco seasonings. Relishes. Fats and Oils Butter, stick margarine, lard, shortening, ghee, and bacon fat. Coconut, palm kernel, or palm oils. Regular salad dressings. Other Pickles and olives. Salted popcorn and pretzels. The items listed above may not be a complete list of foods and beverages to avoid. Contact your dietitian for more information. WHERE CAN I FIND MORE INFORMATION? National Heart, Lung, and Blood Institute: CableProm[employer].itwww.nhlbi.nih.gov/health/health-topics/topics/dash/ Document Released: 07/30/2011 Document Revised: 12/25/2013 Document Reviewed: 06/14/2013 [facility] Patient Information 2015 [place], [place]. This information is not intended to replace advice given to you by your health care provider. Make sure you discuss any questions you have with your health care provider.   Please check your blood pressure on a regular basis.  If it is consistently greater than 150/90, please make an office appointment.\  You need to lose weight.  Consider a lower calorie diet and regular exercise.

## 2014-12-06 ENCOUNTER — Encounter: Payer: Self-pay | Admitting: Family Medicine

## 2014-12-06 ENCOUNTER — Ambulatory Visit (INDEPENDENT_AMBULATORY_CARE_PROVIDER_SITE_OTHER): Payer: Federal, State, Local not specified - PPO | Admitting: Family Medicine

## 2014-12-06 VITALS — BP 120/80 | Temp 98.0°F | Wt 226.0 lb

## 2014-12-06 DIAGNOSIS — H579 Unspecified disorder of eye and adnexa: Secondary | ICD-10-CM

## 2014-12-06 MED ORDER — OLOPATADINE HCL 0.1 % OP SOLN: 1.0000 [drp] | Freq: Two times a day (BID) | OPHTHALMIC | Status: DC

## 2014-12-06 NOTE — Patient Instructions (Signed)
Patanol........... one drop left eye 3 times daily when necessary for itching

## 2014-12-06 NOTE — Progress Notes (Signed)
Subjective:    Patient ID: Catherine Campbell, female    DOB: 01-18-1966, 49 y.o.   MRN: [id number]  HPI Catherine Campbell is a 49 year old female with a history of underlying allergic rhinitis who developed some swelling and itching of her left eye yesterday. She's been taken the Zyrtec it's helped her allergy symptoms but her eye is itchy   Review of Systems Review of systems negative vision normal    Objective:   Physical Exam Well-developed well-nourished female no acute distress vital signs stable she's afebrile examination left eye shows some swelling of the upper and lower eyelids. No chill 80 cm no infection       Assessment & Plan:  Allergic eye............ eyedrops.

## 2014-12-06 NOTE — Progress Notes (Signed)
Pre visit review using our clinic review tool, if applicable. No additional management support is needed unless otherwise documented below in the visit note. 

## 2015-01-26 ENCOUNTER — Other Ambulatory Visit: Payer: Self-pay | Admitting: Internal Medicine

## 2015-02-07 ENCOUNTER — Ambulatory Visit (INDEPENDENT_AMBULATORY_CARE_PROVIDER_SITE_OTHER): Payer: Federal, State, Local not specified - PPO | Admitting: Internal Medicine

## 2015-02-07 ENCOUNTER — Encounter: Payer: Self-pay | Admitting: Internal Medicine

## 2015-02-07 VITALS — BP 120/80 | HR 92 | Temp 98.5°F | Resp 20 | Ht 65.0 in | Wt 230.0 lb

## 2015-02-07 DIAGNOSIS — I10 Essential (primary) hypertension: Secondary | ICD-10-CM

## 2015-02-07 DIAGNOSIS — J3089 Other allergic rhinitis: Secondary | ICD-10-CM | POA: Diagnosis not present

## 2015-02-07 MED ORDER — HYDROCODONE-HOMATROPINE 5-1.5 MG/5ML PO SYRP: 5.0000 mL | ORAL_SOLUTION | Freq: Four times a day (QID) | ORAL | Status: DC | PRN

## 2015-02-07 NOTE — Progress Notes (Signed)
Subjective:    Patient ID: Catherine Campbell, female    DOB: 1965-10-18, 49 y.o.   MRN: [id number]  HPI  49 year old patient who has a history of significant allergic rhinitis.  She presents with a four-day history of increasing cough and some sinus congestion.  No fever.  She also complains of some swelling and mild discomfort involving her left lateral ankle.  She traumatizes about 2 months ago.  Past Medical History  Diagnosis Date  . Hypertension   . Asthma   . ASCUS with positive high risk human papillomavirus of vagina 06/2013    Colposcopic biopsy koilocytotic atypia.  . Low grade squamous intraepithelial lesion (LGSIL) 06/2014    positive high-risk HPV. Colposcopic biopsy LGSIL negative ECC. Recommend follow up Pap smear one year    History   Social History  . Marital Status: Single    Spouse Name: N/A  . Number of Children: N/A  . Years of Education: N/A   Occupational History  . Not on file.   Social History Main Topics  . Smoking status: Never Smoker   . Smokeless tobacco: Never Used  . Alcohol Use: No  . Drug Use: No  . Sexual Activity: No   Other Topics Concern  . Not on file   Social History Narrative    Past Surgical History  Procedure Laterality Date  . Boil under arm      Family History  Problem Relation Age of Onset  . Hypertension Mother   . Diabetes Father   . Hypertension Father     Allergies  Allergen Reactions  . Metaxalone   . Penicillins   . Tramadol-Acetaminophen     Current Outpatient Prescriptions on File Prior to Visit  Medication Sig Dispense Refill  . Azelastine-Fluticasone (DYMISTA NA) Place into the nose as directed.    . cetirizine (ZYRTEC) 10 MG tablet Take 10 mg by mouth daily.    Marland Kitchen DYMISTA 137-50 MCG/ACT SUSP Place 1 spray into both nostrils 2 (two) times daily.     Marland Kitchen EPIPEN 2-PAK 0.3 MG/0.3ML SOAJ injection   0  . fexofenadine (ALLEGRA) 180 MG tablet Take 180 mg by mouth daily.      . Fluticasone-Salmeterol  (ADVAIR HFA IN) Inhale into the lungs.    Marland Kitchen ibuprofen (ADVIL,MOTRIN) 200 MG tablet Take 200 mg by mouth every 6 (six) hours as needed.    Marland Kitchen losartan (COZAAR) 100 MG tablet TAKE 1 TABLET (100 MG TOTAL) BY MOUTH DAILY. 90 tablet 1  . montelukast (SINGULAIR) 10 MG tablet Take 10 mg by mouth at bedtime.     Marland Kitchen nystatin-triamcinolone ointment (MYCOLOG) Apply 1 application topically 2 (two) times daily. 30 g 1  . olopatadine (PATANOL) 0.1 % ophthalmic solution Place 1 drop into the left eye 2 (two) times daily. 5 mL 12  . UNABLE TO FIND Allergy shots twice a week      No current facility-administered medications on file prior to visit.    BP 120/80 mmHg  Pulse 92  Temp(Src) 98.5 F (36.9 C) (Oral)  Resp 20  Ht 5\' 5"  (1.651 m)  Wt 230 lb (104.327 kg)  BMI 38.27 kg/m2  SpO2 97%     Review of Systems  Constitutional: Negative.   HENT: Positive for congestion. Negative for dental problem, hearing loss, rhinorrhea, sinus pressure, sore throat and tinnitus.   Eyes: Negative for pain, discharge and visual disturbance.  Respiratory: Positive for cough. Negative for shortness of breath.   Cardiovascular: Positive for  leg swelling. Negative for chest pain and palpitations.  Gastrointestinal: Negative for nausea, vomiting, abdominal pain, diarrhea, constipation, blood in stool and abdominal distention.  Genitourinary: Negative for dysuria, urgency, frequency, hematuria, flank pain, vaginal bleeding, vaginal discharge, difficulty urinating, vaginal pain and pelvic pain.  Musculoskeletal: Negative for joint swelling, arthralgias and gait problem.  Skin: Negative for rash.  Neurological: Negative for dizziness, syncope, speech difficulty, weakness, numbness and headaches.  Hematological: Negative for adenopathy.  Psychiatric/Behavioral: Negative for behavioral problems, dysphoric mood and agitation. The patient is not nervous/anxious.        Objective:   Physical Exam  Constitutional: She is  oriented to person, place, and time. She appears well-developed and well-nourished.  HENT:  Head: Normocephalic.  Right Ear: External ear normal.  Left Ear: External ear normal.  Mouth/Throat: Oropharynx is clear and moist.  Eyes: Conjunctivae and EOM are normal. Pupils are equal, round, and reactive to light.  Neck: Normal range of motion. Neck supple. No thyromegaly present.  Cardiovascular: Normal rate, regular rhythm, normal heart sounds and intact distal pulses.   Pulmonary/Chest: Effort normal and breath sounds normal.  Abdominal: Soft. Bowel sounds are normal. She exhibits no mass. There is no tenderness.  Musculoskeletal: Normal range of motion. She exhibits edema.  Very minimal soft tissue swelling and tenderness left lateral ankle  Lymphadenopathy:    She has no cervical adenopathy.  Neurological: She is alert and oriented to person, place, and time.  Skin: Skin is warm and dry. No rash noted.  Psychiatric: She has a normal mood and affect. Her behavior is normal.          Assessment & Plan:   Viral URI with cough Allergic rhinitis Contusion left ankle  Will treat symptomatically

## 2015-02-07 NOTE — Patient Instructions (Signed)
Acute bronchitis symptoms for less than 10 days are generally not helped by antibiotics.  Take over-the-counter expectorants and cough medications such as  Mucinex DM.  Call if there is no improvement in 5 to 7 days or if  you develop worsening cough, fever, or new symptoms, such as shortness of breath or chest pain.  HOME CARE INSTRUCTIONS  Get plenty of rest.  Drink enough fluids to keep your urine clear or pale yellow (unless you have a medical condition that requires fluid restriction). Increasing fluids may help thin your respiratory secretions (sputum) and reduce chest congestion, and it will prevent dehydration.  Take medicines only as directed by your health care provider.  If you were prescribed an antibiotic medicine, finish it all even if you start to feel better.  Avoid smoking and secondhand smoke. Exposure to cigarette smoke or irritating chemicals will make bronchitis worse. If you are a smoker, consider using nicotine gum or skin patches to help control withdrawal symptoms. Quitting smoking will help your lungs heal faster.  Reduce the chances of another bout of acute bronchitis by washing your hands frequently, avoiding people with cold symptoms, and trying not to touch your hands to your mouth, nose, or eyes.   

## 2015-02-07 NOTE — Progress Notes (Signed)
Pre visit review using our clinic review tool, if applicable. No additional management support is needed unless otherwise documented below in the visit note. 

## 2015-03-18 ENCOUNTER — Ambulatory Visit: Payer: Federal, State, Local not specified - PPO | Admitting: Internal Medicine

## 2015-03-20 ENCOUNTER — Other Ambulatory Visit: Payer: Self-pay | Admitting: Family Medicine

## 2015-03-25 ENCOUNTER — Ambulatory Visit (INDEPENDENT_AMBULATORY_CARE_PROVIDER_SITE_OTHER): Payer: Federal, State, Local not specified - PPO | Admitting: Internal Medicine

## 2015-03-25 ENCOUNTER — Encounter: Payer: Self-pay | Admitting: Internal Medicine

## 2015-03-25 ENCOUNTER — Ambulatory Visit (INDEPENDENT_AMBULATORY_CARE_PROVIDER_SITE_OTHER)
Admission: RE | Admit: 2015-03-25 | Discharge: 2015-03-25 | Disposition: A | Discharge status: Home / Self Care | Payer: Federal, State, Local not specified - PPO | Source: Ambulatory Visit | Attending: Internal Medicine | Admitting: Internal Medicine

## 2015-03-25 VITALS — BP 110/70 | HR 96 | Temp 98.3°F | Resp 20 | Ht 65.0 in | Wt 231.0 lb

## 2015-03-25 DIAGNOSIS — J4520 Mild intermittent asthma, uncomplicated: Secondary | ICD-10-CM | POA: Diagnosis not present

## 2015-03-25 DIAGNOSIS — I10 Essential (primary) hypertension: Secondary | ICD-10-CM | POA: Diagnosis not present

## 2015-03-25 DIAGNOSIS — J301 Allergic rhinitis due to pollen: Secondary | ICD-10-CM

## 2015-03-25 DIAGNOSIS — M25572 Pain in left ankle and joints of left foot: Secondary | ICD-10-CM | POA: Diagnosis not present

## 2015-03-25 NOTE — Progress Notes (Signed)
Subjective:    Patient ID: Catherine Campbell, female    DOB: 1966-08-24, 49 y.o.   MRN: [id number]  HPI  49 year old patient who is seen today for her biannual follow-up.  She has a history of essential hypertension.  She is followed by allergy medicine for asthma and allergic rhinitis.  This has been fairly stable during the summer months. She was seen in the 6 weeks ago following a left ankle strain.  She continues to have pain and swelling Otherwise, doing well  Past Medical History  Diagnosis Date  . Hypertension   . Asthma   . ASCUS with positive high risk human papillomavirus of vagina 06/2013    Colposcopic biopsy koilocytotic atypia.  . Low grade squamous intraepithelial lesion (LGSIL) 06/2014    positive high-risk HPV. Colposcopic biopsy LGSIL negative ECC. Recommend follow up Pap smear one year    History   Social History  . Marital Status: Single    Spouse Name: N/A  . Number of Children: N/A  . Years of Education: N/A   Occupational History  . Not on file.   Social History Main Topics  . Smoking status: Never Smoker   . Smokeless tobacco: Never Used  . Alcohol Use: No  . Drug Use: No  . Sexual Activity: No   Other Topics Concern  . Not on file   Social History Narrative    Past Surgical History  Procedure Laterality Date  . Boil under arm      Family History  Problem Relation Age of Onset  . Hypertension Mother   . Diabetes Father   . Hypertension Father     Allergies  Allergen Reactions  . Metaxalone   . Penicillins   . Tramadol-Acetaminophen     Current Outpatient Prescriptions on File Prior to Visit  Medication Sig Dispense Refill  . cetirizine (ZYRTEC) 10 MG tablet Take 10 mg by mouth daily.    Marland Kitchen DYMISTA 137-50 MCG/ACT SUSP Place 1 spray into both nostrils 2 (two) times daily.     Marland Kitchen EPIPEN 2-PAK 0.3 MG/0.3ML SOAJ injection   0  . fexofenadine (ALLEGRA) 180 MG tablet Take 180 mg by mouth daily.      Marland Kitchen HYDROcodone-homatropine  (HYCODAN) 5-1.5 MG/5ML syrup Take 5 mLs by mouth every 6 (six) hours as needed for cough. 120 mL 0  . ibuprofen (ADVIL,MOTRIN) 200 MG tablet Take 200 mg by mouth every 6 (six) hours as needed.    Marland Kitchen losartan (COZAAR) 100 MG tablet TAKE 1 TABLET (100 MG TOTAL) BY MOUTH DAILY. 90 tablet 1  . montelukast (SINGULAIR) 10 MG tablet Take 10 mg by mouth at bedtime.     Marland Kitchen nystatin-triamcinolone ointment (MYCOLOG) Apply 1 application topically 2 (two) times daily. 30 g 1  . olopatadine (PATANOL) 0.1 % ophthalmic solution Place 1 drop into the left eye 2 (two) times daily. 5 mL 12  . triamcinolone cream (KENALOG) 0.1 % APPLY TOPICALLY 2 (TWO) TIMES DAILY. 30 g 2  . UNABLE TO FIND Allergy shots twice a week      No current facility-administered medications on file prior to visit.    BP 110/70 mmHg  Pulse 96  Temp(Src) 98.3 F (36.8 C) (Oral)  Resp 20  Ht 5\' 5"  (1.651 m)  Wt 231 lb (104.781 kg)  BMI 38.44 kg/m2  SpO2 98%     Review of Systems  Constitutional: Negative.   HENT: Negative for congestion, dental problem, hearing loss, rhinorrhea, sinus pressure, sore throat  and tinnitus.   Eyes: Negative for pain, discharge and visual disturbance.  Respiratory: Negative for cough and shortness of breath.   Cardiovascular: Negative for chest pain, palpitations and leg swelling.  Gastrointestinal: Negative for nausea, vomiting, abdominal pain, diarrhea, constipation, blood in stool and abdominal distention.  Genitourinary: Negative for dysuria, urgency, frequency, hematuria, flank pain, vaginal bleeding, vaginal discharge, difficulty urinating, vaginal pain and pelvic pain.  Musculoskeletal: Positive for arthralgias. Negative for joint swelling and gait problem.  Skin: Negative for rash.  Neurological: Negative for dizziness, syncope, speech difficulty, weakness, numbness and headaches.  Hematological: Negative for adenopathy.  Psychiatric/Behavioral: Negative for behavioral problems, dysphoric mood  and agitation. The patient is not nervous/anxious.        Objective:   Physical Exam  Constitutional: She is oriented to person, place, and time. She appears well-developed and well-nourished.  Overweight No distress Blood pressure low normal  HENT:  Head: Normocephalic.  Right Ear: External ear normal.  Left Ear: External ear normal.  Mouth/Throat: Oropharynx is clear and moist.  Eyes: Conjunctivae and EOM are normal. Pupils are equal, round, and reactive to light.  Neck: Normal range of motion. Neck supple. No thyromegaly present.  Cardiovascular: Normal rate, regular rhythm, normal heart sounds and intact distal pulses.   Pulmonary/Chest: Effort normal and breath sounds normal.  Abdominal: Soft. Bowel sounds are normal. She exhibits no mass. There is no tenderness.  Musculoskeletal: Normal range of motion.  Soft tissue swelling over the left lateral ankle.  Minimal discomfort anteriorly  Lymphadenopathy:    She has no cervical adenopathy.  Neurological: She is alert and oriented to person, place, and time.  Skin: Skin is warm and dry. No rash noted.  Psychiatric: She has a normal mood and affect. Her behavior is normal.          Assessment & Plan:   Hypertension, well-controlled Asthma, stable Chronic left ankle pain following trauma.  We'll check radiographs.

## 2015-03-25 NOTE — Patient Instructions (Addendum)
X-rays left ankle as discussed.  Call if ankle pain does not resolve over the next 2 weeks   Limit your sodium (Salt) intake  Please check your blood pressure on a regular basis.  If it is consistently greater than 150/90, please make an office appointment.  Return in 6 months for follow-up

## 2015-03-25 NOTE — Progress Notes (Signed)
Pre visit review using our clinic review tool, if applicable. No additional management support is needed unless otherwise documented below in the visit note. 

## 2015-03-27 ENCOUNTER — Other Ambulatory Visit: Payer: Self-pay | Admitting: Internal Medicine

## 2015-03-27 ENCOUNTER — Telehealth: Payer: Self-pay | Admitting: *Deleted

## 2015-03-27 DIAGNOSIS — M25572 Pain in left ankle and joints of left foot: Secondary | ICD-10-CM

## 2015-03-27 NOTE — Telephone Encounter (Signed)
Left detailed message on personal voicemail I received your message about seeing a specialist for left ankle and foot pain. Discussed with Dr.K okay to do referral to Ortho someone will contact you regarding an appointment. Order for referral to Ortho put in EPIC.

## 2015-05-19 ENCOUNTER — Other Ambulatory Visit: Payer: Self-pay | Admitting: Gynecology

## 2015-06-17 ENCOUNTER — Encounter: Payer: Self-pay | Admitting: Family Medicine

## 2015-06-17 ENCOUNTER — Ambulatory Visit (INDEPENDENT_AMBULATORY_CARE_PROVIDER_SITE_OTHER): Payer: Federal, State, Local not specified - PPO | Admitting: Family Medicine

## 2015-06-17 ENCOUNTER — Telehealth: Payer: Self-pay | Admitting: Internal Medicine

## 2015-06-17 VITALS — BP 124/86 | HR 102 | Temp 98.2°F | Ht 65.0 in | Wt 230.0 lb

## 2015-06-17 DIAGNOSIS — J209 Acute bronchitis, unspecified: Secondary | ICD-10-CM | POA: Diagnosis not present

## 2015-06-17 MED ORDER — LEVOFLOXACIN 500 MG PO TABS: 500.0000 mg | ORAL_TABLET | Freq: Every day | ORAL | Status: DC

## 2015-06-17 MED ORDER — METHYLPREDNISOLONE ACETATE 80 MG/ML IJ SUSP
120.0000 mg | Freq: Once | INTRAMUSCULAR | Status: AC
  Administered 2015-06-17: 120 mg via INTRAMUSCULAR

## 2015-06-17 MED ORDER — AMOXICILLIN-POT CLAVULANATE 875-125 MG PO TABS: 1.0000 | ORAL_TABLET | Freq: Two times a day (BID) | ORAL | Status: DC

## 2015-06-17 NOTE — Addendum Note (Signed)
Addended by: Aniceto BossNIMMONS, Katera Rybka A on: 06/17/2015 10:44 AM   Modules accepted: Orders

## 2015-06-17 NOTE — Telephone Encounter (Signed)
The  prescription that was written for the patient today contains penicillin in it and she is allergic to penicillin, so she will need another prescription.

## 2015-06-17 NOTE — Progress Notes (Signed)
Subjective:    Patient ID: Catherine Campbell, female    DOB: [birth year], 49 y.o.   MRN: [id number]  HPI Here for 5 days of sinus pressure, PND, ST, fever, and a dry cough. Not much wheezing so far. Using Mucinex DM.    Review of Systems  HENT: Positive for congestion, postnasal drip, sinus pressure and sore throat.   Eyes: Negative.   Respiratory: Positive for cough. Negative for shortness of breath and wheezing.        Objective:   Physical Exam        Assessment & Plan:

## 2015-06-17 NOTE — Progress Notes (Signed)
Pre visit review using our clinic review tool, if applicable. No additional management support is needed unless otherwise documented below in the visit note. 

## 2015-06-17 NOTE — Telephone Encounter (Signed)
I spoke with pt, sent new script in and cancelled the Augmentin.

## 2015-06-17 NOTE — Telephone Encounter (Signed)
Cancel the Augmentin and call in Levaquin 500 mg daily for 10 days

## 2015-07-17 ENCOUNTER — Encounter: Payer: Self-pay | Admitting: Gynecology

## 2015-07-17 ENCOUNTER — Ambulatory Visit (INDEPENDENT_AMBULATORY_CARE_PROVIDER_SITE_OTHER): Payer: Federal, State, Local not specified - PPO | Admitting: Gynecology

## 2015-07-17 ENCOUNTER — Other Ambulatory Visit (HOSPITAL_COMMUNITY)
Admission: RE | Admit: 2015-07-17 | Discharge: 2015-07-17 | Disposition: A | Discharge status: Home / Self Care | Payer: Federal, State, Local not specified - PPO | Source: Ambulatory Visit | Attending: Gynecology | Admitting: Gynecology

## 2015-07-17 VITALS — BP 120/78 | Ht 66.0 in | Wt 234.0 lb

## 2015-07-17 DIAGNOSIS — Z1151 Encounter for screening for human papillomavirus (HPV): Secondary | ICD-10-CM | POA: Insufficient documentation

## 2015-07-17 DIAGNOSIS — R896 Abnormal cytological findings in specimens from other organs, systems and tissues: Secondary | ICD-10-CM | POA: Diagnosis not present

## 2015-07-17 DIAGNOSIS — N760 Acute vaginitis: Secondary | ICD-10-CM

## 2015-07-17 DIAGNOSIS — Z01411 Encounter for gynecological examination (general) (routine) with abnormal findings: Secondary | ICD-10-CM | POA: Diagnosis present

## 2015-07-17 DIAGNOSIS — IMO0002 Reserved for concepts with insufficient information to code with codable children: Secondary | ICD-10-CM

## 2015-07-17 DIAGNOSIS — Z01419 Encounter for gynecological examination (general) (routine) without abnormal findings: Secondary | ICD-10-CM | POA: Diagnosis not present

## 2015-07-17 LAB — WET PREP FOR TRICH, YEAST, CLUE
Clue Cells Wet Prep HPF POC: NONE SEEN
Trich, Wet Prep: NONE SEEN

## 2015-07-17 MED ORDER — FLUCONAZOLE 150 MG PO TABS: 150.0000 mg | ORAL_TABLET | Freq: Every day | ORAL | Status: DC

## 2015-07-17 MED ORDER — TERCONAZOLE 0.4 % VA CREA: 1.0000 | TOPICAL_CREAM | Freq: Every day | VAGINAL | Status: DC

## 2015-07-17 NOTE — Progress Notes (Signed)
Catherine Campbell May 16, 1966 [id number]        49 y.o.  G0P0  Patient's last menstrual period was 07/03/2015. for annual exam.  Several issues noted below.  Past medical history,surgical history, problem list, medications, allergies, family history and social history were all reviewed and documented as reviewed in the EPIC chart.  ROS:  Performed with pertinent positives and negatives included in the history, assessment and plan.   Additional significant findings :  none   Exam: [facility] person[facility]istant Filed Vitals:   07/17/15 1416  BP: 120/78  Height: 5\' 6"  (1.676 m)  Weight: 234 lb (106.142 kg)   General appearance:  Normal affect, orientation and appearance. Skin: Grossly normal HEENT: Without gross lesions.  No cervical or supraclavicular adenopathy. Thyroid normal.  Lungs:  Clear without wheezing, rales or rhonchi Cardiac: RR, without RMG Abdominal:  Soft, nontender, without masses, guarding, rebound, organomegaly or hernia Breasts:  Examined lying and sitting without masses, retractions, discharge or axillary adenopathy. Pelvic:  Ext/BUS/vagina with symmetrical intense erythematous rash surrounding the introitus. Thick white cakey discharge.  Cervix grossly normal. Pap smear/HPV  Uterus anteverted, normal size, shape and contour, midline and mobile nontender   Adnexa  Without masses or tenderness    Anus and perineum  Normal   Rectovaginal  Normal sphincter tone without palpated masses or tenderness.    Assessment/Plan:  49 y.o. G0P0 female for annual exam with regular menses, not sexually active.   1. Vulvar rash with thick white discharge KOH wet prep positive for yeast. Will treat with Diflucan 150 mg daily 7 days and Terazol 7 day cream nightly internal/external applications. Follow up if symptoms persist, worsen or recur. 2. LGSIL with positive high-risk HPV on colposcopic biopsy 08/2014.  Pap smear/HPV done today. 3. Contraception is not an issue. 4. Mammography due now and I  reminded her to schedule when she agrees to do so. SBE monthly reviewed.  5. Health maintenance. No routine blood work done as this is done at her primary physician's office. Follow up 1 year, sooner as needed.   Catherine Campbell,Catherine Wegman P MD, 2:53 PM 07/17/2015

## 2015-07-17 NOTE — Patient Instructions (Signed)
Used to vaginal cream both inside and outside for 7 nights. Take the Diflucan pill daily for 7 days. Follow up if your symptoms persist, worsen or recur.  You may obtain a copy of any labs that were done today by logging onto MyChart as outlined in the instructions provided with your AVS (after visit summary). The office will not call with normal lab results but certainly if there are any significant abnormalities then we will contact you.   Health Maintenance Adopting a healthy lifestyle and getting preventive care can go a long way to promote health and wellness. Talk with your health care provider about what schedule of regular examinations is right for you. This is a good chance for you to check in with your provider about disease prevention and staying healthy. In between checkups, there are plenty of things you can do on your own. Experts have done a lot of research about which lifestyle changes and preventive measures are most likely to keep you healthy. Ask your health care provider for more information. WEIGHT AND DIET  Eat a healthy diet  Be sure to include plenty of vegetables, fruits, low-fat dairy products, and lean protein.  Do not eat a lot of foods high in solid fats, added sugars, or salt.  Get regular exercise. This is one of the most important things you can do for your health.  Most adults should exercise for at least 150 minutes each week. The exercise should increase your heart rate and make you sweat (moderate-intensity exercise).  Most adults should also do strengthening exercises at least twice a week. This is in addition to the moderate-intensity exercise.  Maintain a healthy weight  Body mass index (BMI) is a measurement that can be used to identify possible weight problems. It estimates body fat based on height and weight. Your health care provider can help determine your BMI and help you achieve or maintain a healthy weight.  For females 71 years of age and older:    A BMI below 18.5 is considered underweight.  A BMI of 18.5 to 24.9 is normal.  A BMI of 25 to 29.9 is considered overweight.  A BMI of 30 and above is considered obese.  Watch levels of cholesterol and blood lipids  You should start having your blood tested for lipids and cholesterol at 48 years of age, then have this test every 5 years.  You may need to have your cholesterol levels checked more often if:  Your lipid or cholesterol levels are high.  You are older than 48 years of age.  You are at high risk for heart disease.  CANCER SCREENING   Lung Cancer  Lung cancer screening is recommended for adults 69-87 years old who are at high risk for lung cancer because of a history of smoking.  A yearly low-dose CT scan of the lungs is recommended for people who:  Currently smoke.  Have quit within the past 15 years.  Have at least a 30-pack-year history of smoking. A pack year is smoking an average of one pack of cigarettes a day for 1 year.  Yearly screening should continue until it has been 15 years since you quit.  Yearly screening should stop if you develop a health problem that would prevent you from having lung cancer treatment.  Breast Cancer  Practice breast self-awareness. This means understanding how your breasts normally appear and feel.  It also means doing regular breast self-exams. Let your health care provider know about any  changes, no matter how small.  If you are in your 20s or 30s, you should have a clinical breast exam (CBE) by a health care provider every 1-3 years as part of a regular health exam.  If you are 58 or older, have a CBE every year. Also consider having a breast X-ray (mammogram) every year.  If you have a family history of breast cancer, talk to your health care provider about genetic screening.  If you are at high risk for breast cancer, talk to your health care provider about having an MRI and a mammogram every year.  Breast  cancer gene (BRCA) assessment is recommended for women who have family members with BRCA-related cancers. BRCA-related cancers include:  Breast.  Ovarian.  Tubal.  Peritoneal cancers.  Results of the assessment will determine the need for genetic counseling and BRCA1 and BRCA2 testing. Cervical Cancer Routine pelvic examinations to screen for cervical cancer are no longer recommended for nonpregnant women who are considered low risk for cancer of the pelvic organs (ovaries, uterus, and vagina) and who do not have symptoms. A pelvic examination may be necessary if you have symptoms including those associated with pelvic infections. Ask your health care provider if a screening pelvic exam is right for you.   The Pap test is the screening test for cervical cancer for women who are considered at risk.  If you had a hysterectomy for a problem that was not cancer or a condition that could lead to cancer, then you no longer need Pap tests.  If you are older than 65 years, and you have had normal Pap tests for the past 10 years, you no longer need to have Pap tests.  If you have had past treatment for cervical cancer or a condition that could lead to cancer, you need Pap tests and screening for cancer for at least 20 years after your treatment.  If you no longer get a Pap test, assess your risk factors if they change (such as having a new sexual partner). This can affect whether you should start being screened again.  Some women have medical problems that increase their chance of getting cervical cancer. If this is the case for you, your health care provider may recommend more frequent screening and Pap tests.  The human papillomavirus (HPV) test is another test that may be used for cervical cancer screening. The HPV test looks for the virus that can cause cell changes in the cervix. The cells collected during the Pap test can be tested for HPV.  The HPV test can be used to screen women 9 years  of age and older. Getting tested for HPV can extend the interval between normal Pap tests from three to five years.  An HPV test also should be used to screen women of any age who have unclear Pap test results.  After 48 years of age, women should have HPV testing as often as Pap tests.  Colorectal Cancer  This type of cancer can be detected and often prevented.  Routine colorectal cancer screening usually begins at 48 years of age and continues through 48 years of age.  Your health care provider may recommend screening at an earlier age if you have risk factors for colon cancer.  Your health care provider may also recommend using home test kits to check for hidden blood in the stool.  A small camera at the end of a tube can be used to examine your colon directly (sigmoidoscopy or colonoscopy).  This is done to check for the earliest forms of colorectal cancer.  Routine screening usually begins at age 79.  Direct examination of the colon should be repeated every 5-10 years through 48 years of age. However, you may need to be screened more often if early forms of precancerous polyps or small growths are found. Skin Cancer  Check your skin from head to toe regularly.  Tell your health care provider about any new moles or changes in moles, especially if there is a change in a mole's shape or color.  Also tell your health care provider if you have a mole that is larger than the size of a pencil eraser.  Always use sunscreen. Apply sunscreen liberally and repeatedly throughout the day.  Protect yourself by wearing long sleeves, pants, a wide-brimmed hat, and sunglasses whenever you are outside. HEART DISEASE, DIABETES, AND HIGH BLOOD PRESSURE   Have your blood pressure checked at least every 1-2 years. High blood pressure causes heart disease and increases the risk of stroke.  If you are between 25 years and 29 years old, ask your health care provider if you should take aspirin to  prevent strokes.  Have regular diabetes screenings. This involves taking a blood sample to check your fasting blood sugar level.  If you are at a normal weight and have a low risk for diabetes, have this test once every three years after 48 years of age.  If you are overweight and have a high risk for diabetes, consider being tested at a younger age or more often. PREVENTING INFECTION  Hepatitis B  If you have a higher risk for hepatitis B, you should be screened for this virus. You are considered at high risk for hepatitis B if:  You were born in a country where hepatitis B is common. Ask your health care provider which countries are considered high risk.  Your parents were born in a high-risk country, and you have not been immunized against hepatitis B (hepatitis B vaccine).  You have HIV or AIDS.  You use needles to inject street drugs.  You live with someone who has hepatitis B.  You have had sex with someone who has hepatitis B.  You get hemodialysis treatment.  You take certain medicines for conditions, including cancer, organ transplantation, and autoimmune conditions. Hepatitis C  Blood testing is recommended for:  Everyone born from [birth year] through 1964.  Anyone with known risk factors for hepatitis C. Sexually transmitted infections (STIs)  You should be screened for sexually transmitted infections (STIs) including gonorrhea and chlamydia if:  You are sexually active and are younger than 48 years of age.  You are older than 48 years of age and your health care provider tells you that you are at risk for this type of infection.  Your sexual activity has changed since you were last screened and you are at an increased risk for chlamydia or gonorrhea. Ask your health care provider if you are at risk.  If you do not have HIV, but are at risk, it may be recommended that you take a prescription medicine daily to prevent HIV infection. This is called pre-exposure  prophylaxis (PrEP). You are considered at risk if:  You are sexually active and do not regularly use condoms or know the HIV status of your partner(s).  You take drugs by injection.  You are sexually active with a partner who has HIV. Talk with your health care provider about whether you are at high risk of  being infected with HIV. If you choose to begin PrEP, you should first be tested for HIV. You should then be tested every 3 months for as long as you are taking PrEP.  PREGNANCY   If you are premenopausal and you may become pregnant, ask your health care provider about preconception counseling.  If you may become pregnant, take 400 to 800 micrograms (mcg) of folic acid every day.  If you want to prevent pregnancy, talk to your health care provider about birth control (contraception). OSTEOPOROSIS AND MENOPAUSE   Osteoporosis is a disease in which the bones lose minerals and strength with aging. This can result in serious bone fractures. Your risk for osteoporosis can be identified using a bone density scan.  If you are 74 years of age or older, or if you are at risk for osteoporosis and fractures, ask your health care provider if you should be screened.  Ask your health care provider whether you should take a calcium or vitamin D supplement to lower your risk for osteoporosis.  Menopause may have certain physical symptoms and risks.  Hormone replacement therapy may reduce some of these symptoms and risks. Talk to your health care provider about whether hormone replacement therapy is right for you.  HOME CARE INSTRUCTIONS   Schedule regular health, dental, and eye exams.  Stay current with your immunizations.   Do not use any tobacco products including cigarettes, chewing tobacco, or electronic cigarettes.  If you are pregnant, do not drink alcohol.  If you are breastfeeding, limit how much and how often you drink alcohol.  Limit alcohol intake to no more than 1 drink per  day for nonpregnant women. One drink equals 12 ounces of beer, 5 ounces of wine, or 1 ounces of hard liquor.  Do not use street drugs.  Do not share needles.  Ask your health care provider for help if you need support or information about quitting drugs.  Tell your health care provider if you often feel depressed.  Tell your health care provider if you have ever been abused or do not feel safe at home. Document Released: 02/23/2011 Document Revised: 12/25/2013 Document Reviewed: 07/12/2013 [facility] Patient Information 2015 [place], [place]. This information is not intended to replace advice given to you by your health care provider. Make sure you discuss any questions you have with your health care provider.

## 2015-07-17 NOTE — Addendum Note (Signed)
Addended by: Dayna BarkerGARDNER, Kmari Brian K on: 07/17/2015 03:18 PM   Modules accepted: Orders

## 2015-07-23 LAB — CYTOLOGY - PAP

## 2015-07-25 ENCOUNTER — Encounter: Payer: Self-pay | Admitting: Gynecology

## 2015-08-24 ENCOUNTER — Other Ambulatory Visit: Payer: Self-pay | Admitting: Internal Medicine

## 2015-09-09 ENCOUNTER — Encounter: Payer: Self-pay | Admitting: Internal Medicine

## 2015-09-09 ENCOUNTER — Ambulatory Visit (INDEPENDENT_AMBULATORY_CARE_PROVIDER_SITE_OTHER): Payer: Federal, State, Local not specified - PPO | Admitting: Internal Medicine

## 2015-09-09 VITALS — BP 130/80 | HR 101 | Temp 98.3°F | Resp 20 | Ht 66.0 in | Wt 235.0 lb

## 2015-09-09 DIAGNOSIS — J4520 Mild intermittent asthma, uncomplicated: Secondary | ICD-10-CM

## 2015-09-09 DIAGNOSIS — I10 Essential (primary) hypertension: Secondary | ICD-10-CM | POA: Diagnosis not present

## 2015-09-09 MED ORDER — PREDNISONE 20 MG PO TABS: 20.0000 mg | ORAL_TABLET | Freq: Two times a day (BID) | ORAL | Status: DC

## 2015-09-09 MED ORDER — HYDROCODONE-HOMATROPINE 5-1.5 MG/5ML PO SYRP: 5.0000 mL | ORAL_SOLUTION | Freq: Four times a day (QID) | ORAL | Status: DC | PRN

## 2015-09-09 NOTE — Patient Instructions (Signed)
Acute bronchitis symptoms  are generally not helped by antibiotics.  Take over-the-counter expectorants and cough medications such as  Mucinex DM.  Call if there is no improvement in 5 to 7 days or if  you develop worsening cough, fever, or new symptoms, such as shortness of breath or chest pain.  HOME CARE INSTRUCTIONS  Drink plenty of water. Water helps thin the mucus so your sinuses can drain more easily.  Use a humidifier.  Inhale steam 3-4 times a day (for example, sit in the bathroom with the shower running).  Apply a warm, moist washcloth to your face 3-4 times a day, or as directed by your health care provider.  Use saline nasal sprays to help moisten and clean your sinuses.  

## 2015-09-09 NOTE — Progress Notes (Signed)
Subjective:    Patient ID: Catherine Campbell, female    DOB: 04-24-1966, 50 y.o.   MRN: [id number]  HPI 50 year old patient who has a history of allergic rhinitis and essential hypertension.  She has chronic asthma.  She has been ill for the past 2 weeks with chest congestion, cough and more wheezing.  She has had increase albuterol rescue use.  No fever or productive cough.  Past Medical History  Diagnosis Date  . Hypertension   . Asthma   . ASCUS with positive high risk human papillomavirus of vagina 06/2013    Colposcopic biopsy koilocytotic atypia.  . Low grade squamous intraepithelial lesion (LGSIL) 06/2014, 06/2015    positive high-risk HPV. Colposcopic biopsy LGSIL negative ECC 2015    Social History   Social History  . Marital Status: Single    Spouse Name: N/A  . Number of Children: N/A  . Years of Education: N/A   Occupational History  . Not on file.   Social History Main Topics  . Smoking status: Never Smoker   . Smokeless tobacco: Never Used  . Alcohol Use: No  . Drug Use: No  . Sexual Activity: No     Comment: 1st intercourse 50 yo-Fewer than 5 partners   Other Topics Concern  . Not on file   Social History Narrative    Past Surgical History  Procedure Laterality Date  . Boil under arm      Family History  Problem Relation Age of Onset  . Hypertension Mother   . Diabetes Father   . Hypertension Father     Allergies  Allergen Reactions  . Metaxalone Hives  . Penicillins Hives  . Tramadol-Acetaminophen Hives    Current Outpatient Prescriptions on File Prior to Visit  Medication Sig Dispense Refill  . ADVAIR DISKUS 250-50 MCG/DOSE AEPB Inhale 1 puff into the lungs 2 (two) times daily.  4  . cetirizine (ZYRTEC) 10 MG tablet Take 10 mg by mouth daily.    Marland Kitchen DYMISTA 137-50 MCG/ACT SUSP Place 1 spray into both nostrils 2 (two) times daily.     Marland Kitchen EPIPEN 2-PAK 0.3 MG/0.3ML SOAJ injection   0  . fexofenadine (ALLEGRA) 180 MG tablet Take 180 mg by  mouth daily.      Marland Kitchen ibuprofen (ADVIL,MOTRIN) 200 MG tablet Take 200 mg by mouth every 6 (six) hours as needed.    Marland Kitchen losartan (COZAAR) 100 MG tablet TAKE 1 TABLET (100 MG TOTAL) BY MOUTH DAILY. 90 tablet 1  . montelukast (SINGULAIR) 10 MG tablet Take 10 mg by mouth at bedtime.     Marland Kitchen UNABLE TO FIND Allergy shots once a week     No current facility-administered medications on file prior to visit.    BP 130/80 mmHg  Pulse 101  Temp(Src) 98.3 F (36.8 C) (Oral)  Resp 20  Ht 5\' 6"  (1.676 m)  Wt 235 lb (106.595 kg)  BMI 37.95 kg/m2  SpO2 98%      Review of Systems  Constitutional: Positive for activity change, appetite change and fatigue.  HENT: Positive for congestion and postnasal drip. Negative for dental problem, hearing loss, rhinorrhea, sinus pressure, sore throat and tinnitus.   Eyes: Negative for pain, discharge and visual disturbance.  Respiratory: Positive for cough, shortness of breath and wheezing.   Cardiovascular: Negative for chest pain, palpitations and leg swelling.  Gastrointestinal: Negative for nausea, vomiting, abdominal pain, diarrhea, constipation, blood in stool and abdominal distention.  Genitourinary: Negative for dysuria, urgency, frequency,  hematuria, flank pain, vaginal bleeding, vaginal discharge, difficulty urinating, vaginal pain and pelvic pain.  Musculoskeletal: Negative for joint swelling, arthralgias and gait problem.  Skin: Negative for rash.  Neurological: Negative for dizziness, syncope, speech difficulty, weakness, numbness and headaches.  Hematological: Negative for adenopathy.  Psychiatric/Behavioral: Negative for behavioral problems, dysphoric mood and agitation. The patient is not nervous/anxious.        Objective:   Physical Exam  Constitutional: She is oriented to person, place, and time. She appears well-developed and well-nourished.  HENT:  Head: Normocephalic.  Right Ear: External ear normal.  Left Ear: External ear normal.    Mouth/Throat: Oropharynx is clear and moist.  Eyes: Conjunctivae and EOM are normal. Pupils are equal, round, and reactive to light.  Neck: Normal range of motion. Neck supple. No thyromegaly present.  Cardiovascular: Normal rate, regular rhythm, normal heart sounds and intact distal pulses.   Pulmonary/Chest: Effort normal and breath sounds normal. No respiratory distress. She has no wheezes. She has no rales.  Abdominal: Soft. Bowel sounds are normal. She exhibits no mass. There is no tenderness.  Musculoskeletal: Normal range of motion.  Lymphadenopathy:    She has no cervical adenopathy.  Neurological: She is alert and oriented to person, place, and time.  Skin: Skin is warm and dry. No rash noted.  Psychiatric: She has a normal mood and affect. Her behavior is normal.          Assessment & Plan:   Asthmatic bronchitis.  Will treat symptomatically.  Will treat with 7 days of oral prednisone.  Continue hydration.  Bronchodilators, expectorants Hypertension, stable

## 2015-09-09 NOTE — Progress Notes (Signed)
Pre visit review using our clinic review tool, if applicable. No additional management support is needed unless otherwise documented below in the visit note. 

## 2015-09-24 ENCOUNTER — Encounter: Payer: Self-pay | Admitting: Gynecology

## 2015-09-24 ENCOUNTER — Ambulatory Visit (INDEPENDENT_AMBULATORY_CARE_PROVIDER_SITE_OTHER): Payer: Federal, State, Local not specified - PPO | Admitting: Gynecology

## 2015-09-24 VITALS — BP 120/76

## 2015-09-24 DIAGNOSIS — R896 Abnormal cytological findings in specimens from other organs, systems and tissues: Secondary | ICD-10-CM

## 2015-09-24 DIAGNOSIS — IMO0002 Reserved for concepts with insufficient information to code with codable children: Secondary | ICD-10-CM

## 2015-09-24 DIAGNOSIS — R87810 Cervical high risk human papillomavirus (HPV) DNA test positive: Secondary | ICD-10-CM | POA: Diagnosis not present

## 2015-09-24 NOTE — Addendum Note (Signed)
Addended by: Dayna Barker on: 09/24/2015 04:47 PM   Modules accepted: Orders

## 2015-09-24 NOTE — Patient Instructions (Signed)
Office will call you with biopsy results 

## 2015-09-24 NOTE — Progress Notes (Signed)
Catherine Campbell [birth year] [id number]        50 y.o.  G0P0 Presents for colposcopy.  History of ASCUS with positive high-risk HPV 2014. Biopsy showed koilocytotic atypia. Follow up Pap smear 2016 was LGSIL with positive high-risk HPV. Biopsy at 3:00 showed LGSIL with negative ECC. Most recent Pap smear showed LGSIL with positive high-risk HPV.  Past medical history,surgical history, problem list, medications, allergies, family history and social history were all reviewed and documented in the EPIC chart.  Directed ROS with pertinent positives and negatives documented in the history of present illness/assessment and plan.  Exam: [name] assistant Filed Vitals:   09/24/15 1618  BP: 120/76   General appearance:  Normal External BUS vagina normal. Cervix normal. Uterus normal size midline mobile nontender. Adnexa without masses or tenderness.  Colposcopy after acetic acid cleanse is adequate normal. ECC performed. Patient tolerated well. Physical Exam  Genitourinary:       Assessment/Plan:  50 y.o. G0P0 history as above. I again reviewed with the patient the whole issue of dysplasia, high-grade/low-grade, progression/regression in the HPV association. Patient will follow up for the ECC results. If normal or low-grade plan expectant management. If otherwise then will triage based on results.    [name], 4:34 PM 09/24/2015

## 2015-09-26 ENCOUNTER — Encounter: Payer: Self-pay | Admitting: Gynecology

## 2015-10-04 ENCOUNTER — Ambulatory Visit: Payer: Federal, State, Local not specified - PPO | Admitting: Internal Medicine

## 2015-10-08 ENCOUNTER — Encounter: Payer: Self-pay | Admitting: Gynecology

## 2015-11-28 DIAGNOSIS — J301 Allergic rhinitis due to pollen: Secondary | ICD-10-CM | POA: Diagnosis not present

## 2015-11-28 DIAGNOSIS — J3089 Other allergic rhinitis: Secondary | ICD-10-CM | POA: Diagnosis not present

## 2015-12-05 DIAGNOSIS — J3089 Other allergic rhinitis: Secondary | ICD-10-CM | POA: Diagnosis not present

## 2015-12-05 DIAGNOSIS — J301 Allergic rhinitis due to pollen: Secondary | ICD-10-CM | POA: Diagnosis not present

## 2015-12-10 DIAGNOSIS — J301 Allergic rhinitis due to pollen: Secondary | ICD-10-CM | POA: Diagnosis not present

## 2015-12-10 DIAGNOSIS — J3089 Other allergic rhinitis: Secondary | ICD-10-CM | POA: Diagnosis not present

## 2015-12-18 DIAGNOSIS — J3089 Other allergic rhinitis: Secondary | ICD-10-CM | POA: Diagnosis not present

## 2015-12-18 DIAGNOSIS — J301 Allergic rhinitis due to pollen: Secondary | ICD-10-CM | POA: Diagnosis not present

## 2015-12-26 DIAGNOSIS — J301 Allergic rhinitis due to pollen: Secondary | ICD-10-CM | POA: Diagnosis not present

## 2015-12-26 DIAGNOSIS — J3089 Other allergic rhinitis: Secondary | ICD-10-CM | POA: Diagnosis not present

## 2015-12-28 DIAGNOSIS — M25572 Pain in left ankle and joints of left foot: Secondary | ICD-10-CM | POA: Diagnosis not present

## 2015-12-30 DIAGNOSIS — J301 Allergic rhinitis due to pollen: Secondary | ICD-10-CM | POA: Diagnosis not present

## 2015-12-30 DIAGNOSIS — J3089 Other allergic rhinitis: Secondary | ICD-10-CM | POA: Diagnosis not present

## 2016-01-10 DIAGNOSIS — M25572 Pain in left ankle and joints of left foot: Secondary | ICD-10-CM | POA: Diagnosis not present

## 2016-01-10 DIAGNOSIS — J4540 Moderate persistent asthma, uncomplicated: Secondary | ICD-10-CM | POA: Diagnosis not present

## 2016-01-10 DIAGNOSIS — J3089 Other allergic rhinitis: Secondary | ICD-10-CM | POA: Diagnosis not present

## 2016-01-10 DIAGNOSIS — J301 Allergic rhinitis due to pollen: Secondary | ICD-10-CM | POA: Diagnosis not present

## 2016-01-10 DIAGNOSIS — H1045 Other chronic allergic conjunctivitis: Secondary | ICD-10-CM | POA: Diagnosis not present

## 2016-01-17 DIAGNOSIS — J301 Allergic rhinitis due to pollen: Secondary | ICD-10-CM | POA: Diagnosis not present

## 2016-01-17 DIAGNOSIS — J3089 Other allergic rhinitis: Secondary | ICD-10-CM | POA: Diagnosis not present

## 2016-01-23 DIAGNOSIS — J301 Allergic rhinitis due to pollen: Secondary | ICD-10-CM | POA: Diagnosis not present

## 2016-01-23 DIAGNOSIS — J3089 Other allergic rhinitis: Secondary | ICD-10-CM | POA: Diagnosis not present

## 2016-01-31 DIAGNOSIS — J301 Allergic rhinitis due to pollen: Secondary | ICD-10-CM | POA: Diagnosis not present

## 2016-01-31 DIAGNOSIS — J3089 Other allergic rhinitis: Secondary | ICD-10-CM | POA: Diagnosis not present

## 2016-02-06 DIAGNOSIS — J3089 Other allergic rhinitis: Secondary | ICD-10-CM | POA: Diagnosis not present

## 2016-02-06 DIAGNOSIS — J301 Allergic rhinitis due to pollen: Secondary | ICD-10-CM | POA: Diagnosis not present

## 2016-02-11 DIAGNOSIS — J301 Allergic rhinitis due to pollen: Secondary | ICD-10-CM | POA: Diagnosis not present

## 2016-02-11 DIAGNOSIS — J3089 Other allergic rhinitis: Secondary | ICD-10-CM | POA: Diagnosis not present

## 2016-02-17 DIAGNOSIS — J3089 Other allergic rhinitis: Secondary | ICD-10-CM | POA: Diagnosis not present

## 2016-02-17 DIAGNOSIS — J301 Allergic rhinitis due to pollen: Secondary | ICD-10-CM | POA: Diagnosis not present

## 2016-02-20 DIAGNOSIS — J3089 Other allergic rhinitis: Secondary | ICD-10-CM | POA: Diagnosis not present

## 2016-02-20 DIAGNOSIS — J301 Allergic rhinitis due to pollen: Secondary | ICD-10-CM | POA: Diagnosis not present

## 2016-02-22 ENCOUNTER — Other Ambulatory Visit: Payer: Self-pay | Admitting: Internal Medicine

## 2016-02-28 DIAGNOSIS — J301 Allergic rhinitis due to pollen: Secondary | ICD-10-CM | POA: Diagnosis not present

## 2016-02-28 DIAGNOSIS — J3089 Other allergic rhinitis: Secondary | ICD-10-CM | POA: Diagnosis not present

## 2016-03-06 DIAGNOSIS — J301 Allergic rhinitis due to pollen: Secondary | ICD-10-CM | POA: Diagnosis not present

## 2016-03-06 DIAGNOSIS — J3089 Other allergic rhinitis: Secondary | ICD-10-CM | POA: Diagnosis not present

## 2016-03-10 DIAGNOSIS — J3089 Other allergic rhinitis: Secondary | ICD-10-CM | POA: Diagnosis not present

## 2016-03-10 DIAGNOSIS — J301 Allergic rhinitis due to pollen: Secondary | ICD-10-CM | POA: Diagnosis not present

## 2016-03-17 ENCOUNTER — Encounter: Payer: Self-pay | Admitting: Internal Medicine

## 2016-03-17 ENCOUNTER — Ambulatory Visit (INDEPENDENT_AMBULATORY_CARE_PROVIDER_SITE_OTHER): Payer: Federal, State, Local not specified - PPO | Admitting: Internal Medicine

## 2016-03-17 VITALS — BP 132/88 | HR 100 | Temp 98.3°F | Ht 66.0 in | Wt 231.0 lb

## 2016-03-17 DIAGNOSIS — I10 Essential (primary) hypertension: Secondary | ICD-10-CM

## 2016-03-17 DIAGNOSIS — J3089 Other allergic rhinitis: Secondary | ICD-10-CM | POA: Diagnosis not present

## 2016-03-17 DIAGNOSIS — J301 Allergic rhinitis due to pollen: Secondary | ICD-10-CM | POA: Diagnosis not present

## 2016-03-17 DIAGNOSIS — J4520 Mild intermittent asthma, uncomplicated: Secondary | ICD-10-CM

## 2016-03-17 NOTE — Patient Instructions (Signed)
Limit your sodium (Salt) intake  Please check your blood pressure on a regular basis.  If it is consistently greater than 150/90, please make an office appointment.  You need to lose weight.  Consider a lower calorie diet and regular exercise.  Return in 6 months for follow-up  

## 2016-03-17 NOTE — Progress Notes (Signed)
Subjective:    Patient ID: Catherine Campbell, female    DOB: 06/24/1966, 50 y.o.   MRN: [id number]  HPI  50 year old patient who has a history of chronic asthma.  She is on maintenance Advair and this has done quite well. She has been seen by orthopedics for some swelling involving the left lateral ankle.  She continues to have this intermittently but no real pain or inflammatory symptoms. She has a history of hypertension which has been well-controlled on losartan.  No new concerns or complaints  BP Readings from Last 3 Encounters:  03/17/16 132/88  09/24/15 120/76  09/09/15 130/80    Wt Readings from Last 3 Encounters:  03/17/16 231 lb (104.8 kg)  09/09/15 235 lb (106.6 kg)  07/17/15 234 lb (106.1 kg)    Past Medical History:  Diagnosis Date  . ASCUS with positive high risk human papillomavirus of vagina 06/2013   Colposcopic biopsy koilocytotic atypia.  . Asthma   . Hypertension   . Low grade squamous intraepithelial lesion (LGSIL) 06/2014, 06/2015   positive high-risk HPV. Colposcopic biopsy LGSIL negative ECC 2015,  ECC 2017 with koilocytotic atypia changes     Social History   Social History  . Marital status: Single    Spouse name: N/A  . Number of children: N/A  . Years of education: N/A   Occupational History  . Not on file.   Social History Main Topics  . Smoking status: Never Smoker  . Smokeless tobacco: Never Used  . Alcohol use No  . Drug use: No  . Sexual activity: No     Comment: 1st intercourse 50 yo-Fewer than 5 partners   Other Topics Concern  . Not on file   Social History Narrative  . No narrative on file    Past Surgical History:  Procedure Laterality Date  . BOIL UNDER ARM      Family History  Problem Relation Age of Onset  . Hypertension Mother   . Diabetes Father   . Hypertension Father     Allergies  Allergen Reactions  . Metaxalone Hives  . Penicillins Hives  . Tramadol-Acetaminophen Hives    Current Outpatient  Prescriptions on File Prior to Visit  Medication Sig Dispense Refill  . ADVAIR DISKUS 250-50 MCG/DOSE AEPB Inhale 1 puff into the lungs 2 (two) times daily.  4  . cetirizine (ZYRTEC) 10 MG tablet Take 10 mg by mouth daily.    Marland Kitchen EPIPEN 2-PAK 0.3 MG/0.3ML SOAJ injection Reported on 09/24/2015  0  . fexofenadine (ALLEGRA) 180 MG tablet Take 180 mg by mouth daily.      Marland Kitchen ibuprofen (ADVIL,MOTRIN) 200 MG tablet Take 200 mg by mouth every 6 (six) hours as needed.    Marland Kitchen losartan (COZAAR) 100 MG tablet TAKE 1 TABLET (100 MG TOTAL) BY MOUTH DAILY. 90 tablet 1  . montelukast (SINGULAIR) 10 MG tablet Take 10 mg by mouth at bedtime.     Marland Kitchen UNABLE TO FIND Allergy shots once a week     No current facility-administered medications on file prior to visit.     BP 132/88   Pulse 100   Temp 98.3 F (36.8 C) (Oral)   Ht 5\' 6"  (1.676 m)   Wt 231 lb (104.8 kg)   SpO2 98%   BMI 37.28 kg/m     Review of Systems  Constitutional: Negative.   HENT: Negative for congestion, dental problem, hearing loss, rhinorrhea, sinus pressure, sore throat and tinnitus.   Eyes: Negative  for pain, discharge and visual disturbance.  Respiratory: Negative for cough and shortness of breath.   Cardiovascular: Negative for chest pain, palpitations and leg swelling.  Gastrointestinal: Negative for abdominal distention, abdominal pain, blood in stool, constipation, diarrhea, nausea and vomiting.  Genitourinary: Negative for difficulty urinating, dysuria, flank pain, frequency, hematuria, pelvic pain, urgency, vaginal bleeding, vaginal discharge and vaginal pain.  Musculoskeletal: Negative for arthralgias, gait problem and joint swelling.  Skin: Negative for rash.  Neurological: Negative for dizziness, syncope, speech difficulty, weakness, numbness and headaches.  Hematological: Negative for adenopathy.  Psychiatric/Behavioral: Negative for agitation, behavioral problems and dysphoric mood. The patient is not nervous/anxious.         Objective:   Physical Exam  Constitutional: She appears well-developed and well-nourished. No distress.  Eyes: Conjunctivae are normal. Pupils are equal, round, and reactive to light.  Neck: Normal range of motion. Neck supple. No thyromegaly present.  Cardiovascular: Normal rate, regular rhythm and normal heart sounds.   Pulmonary/Chest: Effort normal and breath sounds normal. She has no wheezes.  Musculoskeletal:  Very slight edema involving the left lateral ankle area  Lymphadenopathy:    She has no cervical adenopathy.          Assessment & Plan:   Essential hypertension, well-controlled Chronic stable asthma Mild obesity.  Weight loss encouraged Allergic rhinitis  Follow-up 6 months or as needed  Rogelia Boga, MD

## 2016-03-17 NOTE — Progress Notes (Signed)
Pre visit review using our clinic review tool, if applicable. No additional management support is needed unless otherwise documented below in the visit note. 

## 2016-03-30 DIAGNOSIS — J301 Allergic rhinitis due to pollen: Secondary | ICD-10-CM | POA: Diagnosis not present

## 2016-03-30 DIAGNOSIS — J3089 Other allergic rhinitis: Secondary | ICD-10-CM | POA: Diagnosis not present

## 2016-04-07 DIAGNOSIS — J301 Allergic rhinitis due to pollen: Secondary | ICD-10-CM | POA: Diagnosis not present

## 2016-04-07 DIAGNOSIS — J3089 Other allergic rhinitis: Secondary | ICD-10-CM | POA: Diagnosis not present

## 2016-04-10 DIAGNOSIS — J301 Allergic rhinitis due to pollen: Secondary | ICD-10-CM | POA: Diagnosis not present

## 2016-04-10 DIAGNOSIS — J3089 Other allergic rhinitis: Secondary | ICD-10-CM | POA: Diagnosis not present

## 2016-04-14 DIAGNOSIS — J3089 Other allergic rhinitis: Secondary | ICD-10-CM | POA: Diagnosis not present

## 2016-04-14 DIAGNOSIS — J301 Allergic rhinitis due to pollen: Secondary | ICD-10-CM | POA: Diagnosis not present

## 2016-04-16 DIAGNOSIS — J301 Allergic rhinitis due to pollen: Secondary | ICD-10-CM | POA: Diagnosis not present

## 2016-04-16 DIAGNOSIS — J3089 Other allergic rhinitis: Secondary | ICD-10-CM | POA: Diagnosis not present

## 2016-04-20 ENCOUNTER — Encounter: Payer: Self-pay | Admitting: Internal Medicine

## 2016-04-20 ENCOUNTER — Ambulatory Visit (INDEPENDENT_AMBULATORY_CARE_PROVIDER_SITE_OTHER): Payer: Federal, State, Local not specified - PPO | Admitting: Internal Medicine

## 2016-04-20 VITALS — BP 130/86 | HR 98 | Temp 98.5°F | Resp 20 | Ht 66.0 in | Wt 230.2 lb

## 2016-04-20 DIAGNOSIS — J4520 Mild intermittent asthma, uncomplicated: Secondary | ICD-10-CM

## 2016-04-20 DIAGNOSIS — J3089 Other allergic rhinitis: Secondary | ICD-10-CM | POA: Diagnosis not present

## 2016-04-20 MED ORDER — PREDNISONE 10 MG PO TABS: ORAL_TABLET | ORAL | 0 refills | Status: DC

## 2016-04-20 NOTE — Progress Notes (Signed)
Subjective:    Patient ID: Catherine Campbell, female    DOB: June 23, 1966, 50 y.o.   MRN: [id number]  HPI  50 year old patient who has a history of chronic asthma as well as allergic rhinitis.  The past 3 weeks she has had increasing sinus congestion, drainage and cough.  There is been no wheezing.  Maintenance medication includes fluticasone and Astelin nasal spray, Advair, Zyrtec, Allegra, as well as Singulair She has treated hypertension which has been stable No fever  Past Medical History:  Diagnosis Date  . ASCUS with positive high risk human papillomavirus of vagina 06/2013   Colposcopic biopsy koilocytotic atypia.  . Asthma   . Hypertension   . Low grade squamous intraepithelial lesion (LGSIL) 06/2014, 06/2015   positive high-risk HPV. Colposcopic biopsy LGSIL negative ECC 2015,  ECC 2017 with koilocytotic atypia changes     Social History   Social History  . Marital status: Single    Spouse name: N/A  . Number of children: N/A  . Years of education: N/A   Occupational History  . Not on file.   Social History Main Topics  . Smoking status: Never Smoker  . Smokeless tobacco: Never Used  . Alcohol use No  . Drug use: No  . Sexual activity: No     Comment: 1st intercourse 50 yo-Fewer than 5 partners   Other Topics Concern  . Not on file   Social History Narrative  . No narrative on file    Past Surgical History:  Procedure Laterality Date  . BOIL UNDER ARM      Family History  Problem Relation Age of Onset  . Hypertension Mother   . Diabetes Father   . Hypertension Father     Allergies  Allergen Reactions  . Metaxalone Hives  . Penicillins Hives  . Tramadol-Acetaminophen Hives    Current Outpatient Prescriptions on File Prior to Visit  Medication Sig Dispense Refill  . ADVAIR DISKUS 250-50 MCG/DOSE AEPB Inhale 1 puff into the lungs 2 (two) times daily.  4  . cetirizine (ZYRTEC) 10 MG tablet Take 10 mg by mouth daily.    Marland Kitchen EPIPEN 2-PAK 0.3  MG/0.3ML SOAJ injection Reported on 09/24/2015  0  . fexofenadine (ALLEGRA) 180 MG tablet Take 180 mg by mouth daily.      Marland Kitchen ibuprofen (ADVIL,MOTRIN) 200 MG tablet Take 200 mg by mouth every 6 (six) hours as needed.    Marland Kitchen losartan (COZAAR) 100 MG tablet TAKE 1 TABLET (100 MG TOTAL) BY MOUTH DAILY. 90 tablet 1  . montelukast (SINGULAIR) 10 MG tablet Take 10 mg by mouth at bedtime.     Marland Kitchen UNABLE TO FIND Allergy shots once a week     No current facility-administered medications on file prior to visit.     BP 130/86 (BP Location: Right Arm, Patient Position: Sitting, Cuff Size: Large)   Pulse 98   Temp 98.5 F (36.9 C) (Oral)   Resp 20   Ht 5\' 6"  (1.676 m)   Wt 230 lb 4 oz (104.4 kg)   SpO2 98%   BMI 37.16 kg/m   \ Review of Systems  Constitutional: Positive for appetite change and fatigue.  HENT: Positive for congestion, postnasal drip, rhinorrhea and sinus pressure. Negative for dental problem, hearing loss, sore throat and tinnitus.   Eyes: Negative for pain, discharge and visual disturbance.  Respiratory: Positive for cough. Negative for shortness of breath and wheezing.   Cardiovascular: Negative for chest pain, palpitations and leg  swelling.  Gastrointestinal: Negative for abdominal distention, abdominal pain, blood in stool, constipation, diarrhea, nausea and vomiting.  Genitourinary: Negative for difficulty urinating, dysuria, flank pain, frequency, hematuria, pelvic pain, urgency, vaginal bleeding, vaginal discharge and vaginal pain.  Musculoskeletal: Negative for arthralgias, gait problem and joint swelling.  Skin: Negative for rash.  Neurological: Negative for dizziness, syncope, speech difficulty, weakness, numbness and headaches.  Hematological: Negative for adenopathy.  Psychiatric/Behavioral: Negative for agitation, behavioral problems and dysphoric mood. The patient is not nervous/anxious.        Objective:   Physical Exam  Constitutional: She is oriented to person,  place, and time. She appears well-developed and well-nourished.  No distress Hoarse Afebrile  HENT:  Head: Normocephalic.  Right Ear: External ear normal.  Left Ear: External ear normal.  Mouth/Throat: Oropharynx is clear and moist.  Eyes: Conjunctivae and EOM are normal. Pupils are equal, round, and reactive to light.  Neck: Normal range of motion. Neck supple. No thyromegaly present.  Cardiovascular: Normal rate, regular rhythm, normal heart sounds and intact distal pulses.   Pulmonary/Chest: Effort normal and breath sounds normal.  Abdominal: Soft. Bowel sounds are normal. She exhibits no mass. There is no tenderness.  Musculoskeletal: Normal range of motion.  Lymphadenopathy:    She has no cervical adenopathy.  Neurological: She is alert and oriented to person, place, and time.  Skin: Skin is warm and dry. No rash noted.  Psychiatric: She has a normal mood and affect. Her behavior is normal.          Assessment & Plan:   Allergic rhinitis flare.  Will treat with a prednisone Dosepak Chronic asthma, stable  Rogelia Boga, MD

## 2016-04-20 NOTE — Patient Instructions (Signed)
Prednisone Dosepak as prescribed  Call or return to clinic prn if these symptoms worsen or fail to improve as anticipated.

## 2016-04-20 NOTE — Progress Notes (Signed)
Pre visit review using our clinic review tool, if applicable. No additional management support is needed unless otherwise documented below in the visit note. 

## 2016-04-21 ENCOUNTER — Ambulatory Visit: Payer: Federal, State, Local not specified - PPO | Admitting: Internal Medicine

## 2016-04-22 ENCOUNTER — Telehealth: Payer: Self-pay | Admitting: Internal Medicine

## 2016-04-22 ENCOUNTER — Encounter: Payer: Self-pay | Admitting: Family Medicine

## 2016-04-22 NOTE — Telephone Encounter (Signed)
Letter given to pt at front desk by Okey Regalarol.  Will now close note.

## 2016-04-22 NOTE — Telephone Encounter (Signed)
Pt was seen on 04/20/16 and needs a revise note from 04/20/16 and return to work on 04-28-16 due to illness

## 2016-04-22 NOTE — Telephone Encounter (Signed)
Discussed with Dr. Kirtland BouchardK pt would like work excuse extended till 9/5 due to illness. Per Dr. Kirtland BouchardK okay to extend work excuse till 9/5.

## 2016-04-30 DIAGNOSIS — J3089 Other allergic rhinitis: Secondary | ICD-10-CM | POA: Diagnosis not present

## 2016-04-30 DIAGNOSIS — J301 Allergic rhinitis due to pollen: Secondary | ICD-10-CM | POA: Diagnosis not present

## 2016-05-08 DIAGNOSIS — J301 Allergic rhinitis due to pollen: Secondary | ICD-10-CM | POA: Diagnosis not present

## 2016-05-08 DIAGNOSIS — J3089 Other allergic rhinitis: Secondary | ICD-10-CM | POA: Diagnosis not present

## 2016-05-15 DIAGNOSIS — J301 Allergic rhinitis due to pollen: Secondary | ICD-10-CM | POA: Diagnosis not present

## 2016-05-15 DIAGNOSIS — J3089 Other allergic rhinitis: Secondary | ICD-10-CM | POA: Diagnosis not present

## 2016-05-19 DIAGNOSIS — J3089 Other allergic rhinitis: Secondary | ICD-10-CM | POA: Diagnosis not present

## 2016-05-19 DIAGNOSIS — J301 Allergic rhinitis due to pollen: Secondary | ICD-10-CM | POA: Diagnosis not present

## 2016-05-28 DIAGNOSIS — J301 Allergic rhinitis due to pollen: Secondary | ICD-10-CM | POA: Diagnosis not present

## 2016-05-28 DIAGNOSIS — J3089 Other allergic rhinitis: Secondary | ICD-10-CM | POA: Diagnosis not present

## 2016-06-04 DIAGNOSIS — J3089 Other allergic rhinitis: Secondary | ICD-10-CM | POA: Diagnosis not present

## 2016-06-04 DIAGNOSIS — J301 Allergic rhinitis due to pollen: Secondary | ICD-10-CM | POA: Diagnosis not present

## 2016-06-11 DIAGNOSIS — J3089 Other allergic rhinitis: Secondary | ICD-10-CM | POA: Diagnosis not present

## 2016-06-11 DIAGNOSIS — J301 Allergic rhinitis due to pollen: Secondary | ICD-10-CM | POA: Diagnosis not present

## 2016-06-18 DIAGNOSIS — J3089 Other allergic rhinitis: Secondary | ICD-10-CM | POA: Diagnosis not present

## 2016-06-18 DIAGNOSIS — J301 Allergic rhinitis due to pollen: Secondary | ICD-10-CM | POA: Diagnosis not present

## 2016-06-25 DIAGNOSIS — J3089 Other allergic rhinitis: Secondary | ICD-10-CM | POA: Diagnosis not present

## 2016-06-25 DIAGNOSIS — J301 Allergic rhinitis due to pollen: Secondary | ICD-10-CM | POA: Diagnosis not present

## 2016-07-01 DIAGNOSIS — J3089 Other allergic rhinitis: Secondary | ICD-10-CM | POA: Diagnosis not present

## 2016-07-01 DIAGNOSIS — J301 Allergic rhinitis due to pollen: Secondary | ICD-10-CM | POA: Diagnosis not present

## 2016-07-01 DIAGNOSIS — J4540 Moderate persistent asthma, uncomplicated: Secondary | ICD-10-CM | POA: Diagnosis not present

## 2016-07-01 DIAGNOSIS — J019 Acute sinusitis, unspecified: Secondary | ICD-10-CM | POA: Diagnosis not present

## 2016-07-01 DIAGNOSIS — H1045 Other chronic allergic conjunctivitis: Secondary | ICD-10-CM | POA: Diagnosis not present

## 2016-07-07 DIAGNOSIS — J301 Allergic rhinitis due to pollen: Secondary | ICD-10-CM | POA: Diagnosis not present

## 2016-07-07 DIAGNOSIS — J3089 Other allergic rhinitis: Secondary | ICD-10-CM | POA: Diagnosis not present

## 2016-07-14 DIAGNOSIS — H1045 Other chronic allergic conjunctivitis: Secondary | ICD-10-CM | POA: Diagnosis not present

## 2016-07-14 DIAGNOSIS — J3089 Other allergic rhinitis: Secondary | ICD-10-CM | POA: Diagnosis not present

## 2016-07-14 DIAGNOSIS — J4540 Moderate persistent asthma, uncomplicated: Secondary | ICD-10-CM | POA: Diagnosis not present

## 2016-07-14 DIAGNOSIS — J301 Allergic rhinitis due to pollen: Secondary | ICD-10-CM | POA: Diagnosis not present

## 2016-07-20 ENCOUNTER — Ambulatory Visit (INDEPENDENT_AMBULATORY_CARE_PROVIDER_SITE_OTHER): Payer: Federal, State, Local not specified - PPO | Admitting: Gynecology

## 2016-07-20 ENCOUNTER — Encounter: Payer: Self-pay | Admitting: Gynecology

## 2016-07-20 VITALS — BP 126/84 | Ht 66.0 in | Wt 227.0 lb

## 2016-07-20 DIAGNOSIS — N841 Polyp of cervix uteri: Secondary | ICD-10-CM

## 2016-07-20 DIAGNOSIS — Z01411 Encounter for gynecological examination (general) (routine) with abnormal findings: Secondary | ICD-10-CM | POA: Diagnosis not present

## 2016-07-20 DIAGNOSIS — R87612 Low grade squamous intraepithelial lesion on cytologic smear of cervix (LGSIL): Secondary | ICD-10-CM

## 2016-07-20 DIAGNOSIS — Z1151 Encounter for screening for human papillomavirus (HPV): Secondary | ICD-10-CM

## 2016-07-20 NOTE — Addendum Note (Signed)
Addended by: Dayna BarkerGARDNER, Tasheika Kitzmiller K on: 07/20/2016 04:39 PM   Modules accepted: Orders

## 2016-07-20 NOTE — Progress Notes (Signed)
Catherine Campbell 1965-11-05 [id number]        50 y.o.  G0P0  for annual exam.  Past medical history,surgical history, problem list, medications, allergies, family history and social history were all reviewed and documented as reviewed in the EPIC chart.  ROS:  Performed with pertinent positives and negatives included in the history, assessment and plan.   Additional significant findings :  None   Exam: [name] assistant Vitals:   07/20/16 1547  BP: 126/84  Weight: 227 lb (103 kg)  Height: 5\' 6"  (1.676 m)   Body mass index is 36.64 kg/m.  General appearance:  Normal affect, orientation and appearance. Skin: Grossly normal HEENT: Without gross lesions.  No cervical or supraclavicular adenopathy. Thyroid normal.  Lungs:  Clear without wheezing, rales or rhonchi Cardiac: RR, without RMG Abdominal:  Soft, nontender, without masses, guarding, rebound, organomegaly or hernia Breasts:  Examined lying and sitting without masses, retractions, discharge or axillary adenopathy. Pelvic:  Ext, BUS, Vagina normal  Cervix with endocervical polyp. Pap smear/HPV performed. Endocervical polyp biopsied off and sent to pathology  Uterus anteverted, normal size, shape and contour, midline and mobile nontender   Adnexa without masses or tenderness    Anus and perineum normal   Rectovaginal normal sphincter tone without palpated masses or tenderness.    Assessment/Plan:  50 y.o. G0P0 female for annual exam with regular menses, not sexually active.   1. History of LGSIL with positive high-risk HPV. 2014 ASCUS Pap smear with positive high-risk HPV. Biopsy showed koilocytotic atypia. Pap smear 2016 LGSIL with positive high-risk HPV. Biopsy at 3:00 showed LGSIL. ECC negative. Pap smear 06/2015 LGSIL. Colposcopy 08/2015 was adequate/normal with ECC showing koilocytotic atypia and benign endocervical tissue. Pap smear/HPV today. 2. Endocervical polyp biopsied off and sent to pathology. Patient will  follow up for pathology results. 3. Contraception not an issue 4. Mammography 09/2015. Continue with annual mammography when due. SBE monthly reviewed. 5. Health maintenance. No routine lab work done as patient reports this done elsewhere. Follow up for Pap smear and endocervical polyp report otherwise follow up in one year for annual exam.    [name], 4:25 PM 07/20/2016

## 2016-07-20 NOTE — Patient Instructions (Addendum)
Office will call you with the biopsy results from the endocervical polyp and your Pap smear results.   You may obtain a copy of any labs that were done today by logging onto MyChart as outlined in the instructions provided with your AVS (after visit summary). The office will not call with normal lab results but certainly if there are any significant abnormalities then we will contact you.   Health Maintenance Adopting a healthy lifestyle and getting preventive care can go a long way to promote health and wellness. Talk with your health care provider about what schedule of regular examinations is right for you. This is a good chance for you to check in with your provider about disease prevention and staying healthy. In between checkups, there are plenty of things you can do on your own. Experts have done a lot of research about which lifestyle changes and preventive measures are most likely to keep you healthy. Ask your health care provider for more information. WEIGHT AND DIET  Eat a healthy diet  Be sure to include plenty of vegetables, fruits, low-fat dairy products, and lean protein.  Do not eat a lot of foods high in solid fats, added sugars, or salt.  Get regular exercise. This is one of the most important things you can do for your health.  Most adults should exercise for at least 150 minutes each week. The exercise should increase your heart rate and make you sweat (moderate-intensity exercise).  Most adults should also do strengthening exercises at least twice a week. This is in addition to the moderate-intensity exercise.  Maintain a healthy weight  Body mass index (BMI) is a measurement that can be used to identify possible weight problems. It estimates body fat based on height and weight. Your health care provider can help determine your BMI and help you achieve or maintain a healthy weight.  For females 34 years of age and older:   A BMI below 18.5 is considered  underweight.  A BMI of 18.5 to 24.9 is normal.  A BMI of 25 to 29.9 is considered overweight.  A BMI of 30 and above is considered obese.  Watch levels of cholesterol and blood lipids  You should start having your blood tested for lipids and cholesterol at 49 years of age, then have this test every 5 years.  You may need to have your cholesterol levels checked more often if:  Your lipid or cholesterol levels are high.  You are older than 49 years of age.  You are at high risk for heart disease.  CANCER SCREENING   Lung Cancer  Lung cancer screening is recommended for adults 51-40 years old who are at high risk for lung cancer because of a history of smoking.  A yearly low-dose CT scan of the lungs is recommended for people who:  Currently smoke.  Have quit within the past 15 years.  Have at least a 30-pack-year history of smoking. A pack year is smoking an average of one pack of cigarettes a day for 1 year.  Yearly screening should continue until it has been 15 years since you quit.  Yearly screening should stop if you develop a health problem that would prevent you from having lung cancer treatment.  Breast Cancer  Practice breast self-awareness. This means understanding how your breasts normally appear and feel.  It also means doing regular breast self-exams. Let your health care provider know about any changes, no matter how small.  If you are in  your 20s or 30s, you should have a clinical breast exam (CBE) by a health care provider every 1-3 years as part of a regular health exam.  If you are 39 or older, have a CBE every year. Also consider having a breast X-ray (mammogram) every year.  If you have a family history of breast cancer, talk to your health care provider about genetic screening.  If you are at high risk for breast cancer, talk to your health care provider about having an MRI and a mammogram every year.  Breast cancer gene (BRCA) assessment is  recommended for women who have family members with BRCA-related cancers. BRCA-related cancers include:  Breast.  Ovarian.  Tubal.  Peritoneal cancers.  Results of the assessment will determine the need for genetic counseling and BRCA1 and BRCA2 testing. Cervical Cancer Routine pelvic examinations to screen for cervical cancer are no longer recommended for nonpregnant women who are considered low risk for cancer of the pelvic organs (ovaries, uterus, and vagina) and who do not have symptoms. A pelvic examination may be necessary if you have symptoms including those associated with pelvic infections. Ask your health care provider if a screening pelvic exam is right for you.   The Pap test is the screening test for cervical cancer for women who are considered at risk.  If you had a hysterectomy for a problem that was not cancer or a condition that could lead to cancer, then you no longer need Pap tests.  If you are older than 65 years, and you have had normal Pap tests for the past 10 years, you no longer need to have Pap tests.  If you have had past treatment for cervical cancer or a condition that could lead to cancer, you need Pap tests and screening for cancer for at least 20 years after your treatment.  If you no longer get a Pap test, assess your risk factors if they change (such as having a new sexual partner). This can affect whether you should start being screened again.  Some women have medical problems that increase their chance of getting cervical cancer. If this is the case for you, your health care provider may recommend more frequent screening and Pap tests.  The human papillomavirus (HPV) test is another test that may be used for cervical cancer screening. The HPV test looks for the virus that can cause cell changes in the cervix. The cells collected during the Pap test can be tested for HPV.  The HPV test can be used to screen women 60 years of age and older. Getting tested  for HPV can extend the interval between normal Pap tests from three to five years.  An HPV test also should be used to screen women of any age who have unclear Pap test results.  After 49 years of age, women should have HPV testing as often as Pap tests.  Colorectal Cancer  This type of cancer can be detected and often prevented.  Routine colorectal cancer screening usually begins at 49 years of age and continues through 49 years of age.  Your health care provider may recommend screening at an earlier age if you have risk factors for colon cancer.  Your health care provider may also recommend using home test kits to check for hidden blood in the stool.  A small camera at the end of a tube can be used to examine your colon directly (sigmoidoscopy or colonoscopy). This is done to check for the earliest forms of  colorectal cancer.  Routine screening usually begins at age 66.  Direct examination of the colon should be repeated every 5-10 years through 49 years of age. However, you may need to be screened more often if early forms of precancerous polyps or small growths are found. Skin Cancer  Check your skin from head to toe regularly.  Tell your health care provider about any new moles or changes in moles, especially if there is a change in a mole's shape or color.  Also tell your health care provider if you have a mole that is larger than the size of a pencil eraser.  Always use sunscreen. Apply sunscreen liberally and repeatedly throughout the day.  Protect yourself by wearing long sleeves, pants, a wide-brimmed hat, and sunglasses whenever you are outside. HEART DISEASE, DIABETES, AND HIGH BLOOD PRESSURE   Have your blood pressure checked at least every 1-2 years. High blood pressure causes heart disease and increases the risk of stroke.  If you are between 55 years and 55 years old, ask your health care provider if you should take aspirin to prevent strokes.  Have regular  diabetes screenings. This involves taking a blood sample to check your fasting blood sugar level.  If you are at a normal weight and have a low risk for diabetes, have this test once every three years after 49 years of age.  If you are overweight and have a high risk for diabetes, consider being tested at a younger age or more often. PREVENTING INFECTION  Hepatitis B  If you have a higher risk for hepatitis B, you should be screened for this virus. You are considered at high risk for hepatitis B if:  You were born in a country where hepatitis B is common. Ask your health care provider which countries are considered high risk.  Your parents were born in a high-risk country, and you have not been immunized against hepatitis B (hepatitis B vaccine).  You have HIV or AIDS.  You use needles to inject street drugs.  You live with someone who has hepatitis B.  You have had sex with someone who has hepatitis B.  You get hemodialysis treatment.  You take certain medicines for conditions, including cancer, organ transplantation, and autoimmune conditions. Hepatitis C  Blood testing is recommended for:  Everyone born from [birth year] through 1964.  Anyone with known risk factors for hepatitis C. Sexually transmitted infections (STIs)  You should be screened for sexually transmitted infections (STIs) including gonorrhea and chlamydia if:  You are sexually active and are younger than 49 years of age.  You are older than 49 years of age and your health care provider tells you that you are at risk for this type of infection.  Your sexual activity has changed since you were last screened and you are at an increased risk for chlamydia or gonorrhea. Ask your health care provider if you are at risk.  If you do not have HIV, but are at risk, it may be recommended that you take a prescription medicine daily to prevent HIV infection. This is called pre-exposure prophylaxis (PrEP). You are considered at  risk if:  You are sexually active and do not regularly use condoms or know the HIV status of your partner(s).  You take drugs by injection.  You are sexually active with a partner who has HIV. Talk with your health care provider about whether you are at high risk of being infected with HIV. If you choose to begin PrEP,  you should first be tested for HIV. You should then be tested every 3 months for as long as you are taking PrEP.  PREGNANCY   If you are premenopausal and you may become pregnant, ask your health care provider about preconception counseling.  If you may become pregnant, take 400 to 800 micrograms (mcg) of folic acid every day.  If you want to prevent pregnancy, talk to your health care provider about birth control (contraception). OSTEOPOROSIS AND MENOPAUSE   Osteoporosis is a disease in which the bones lose minerals and strength with aging. This can result in serious bone fractures. Your risk for osteoporosis can be identified using a bone density scan.  If you are 79 years of age or older, or if you are at risk for osteoporosis and fractures, ask your health care provider if you should be screened.  Ask your health care provider whether you should take a calcium or vitamin D supplement to lower your risk for osteoporosis.  Menopause may have certain physical symptoms and risks.  Hormone replacement therapy may reduce some of these symptoms and risks. Talk to your health care provider about whether hormone replacement therapy is right for you.  HOME CARE INSTRUCTIONS   Schedule regular health, dental, and eye exams.  Stay current with your immunizations.   Do not use any tobacco products including cigarettes, chewing tobacco, or electronic cigarettes.  If you are pregnant, do not drink alcohol.  If you are breastfeeding, limit how much and how often you drink alcohol.  Limit alcohol intake to no more than 1 drink per day for nonpregnant women. One drink equals  12 ounces of beer, 5 ounces of wine, or 1 ounces of hard liquor.  Do not use street drugs.  Do not share needles.  Ask your health care provider for help if you need support or information about quitting drugs.  Tell your health care provider if you often feel depressed.  Tell your health care provider if you have ever been abused or do not feel safe at home. Document Released: 02/23/2011 Document Revised: 12/25/2013 Document Reviewed: 07/12/2013 [facility] Patient Information 2015 [place], [place]. This information is not intended to replace advice given to you by your health care provider. Make sure you discuss any questions you have with your health care provider.

## 2016-07-22 ENCOUNTER — Encounter: Payer: Self-pay | Admitting: Gynecology

## 2016-07-22 LAB — PAP IG AND HPV HIGH-RISK: HPV DNA High Risk: DETECTED — AB

## 2016-07-22 LAB — PATHOLOGY

## 2016-07-23 DIAGNOSIS — J301 Allergic rhinitis due to pollen: Secondary | ICD-10-CM | POA: Diagnosis not present

## 2016-07-24 DIAGNOSIS — J3089 Other allergic rhinitis: Secondary | ICD-10-CM | POA: Diagnosis not present

## 2016-07-28 DIAGNOSIS — J301 Allergic rhinitis due to pollen: Secondary | ICD-10-CM | POA: Diagnosis not present

## 2016-07-28 DIAGNOSIS — J3089 Other allergic rhinitis: Secondary | ICD-10-CM | POA: Diagnosis not present

## 2016-07-30 DIAGNOSIS — J301 Allergic rhinitis due to pollen: Secondary | ICD-10-CM | POA: Diagnosis not present

## 2016-07-30 DIAGNOSIS — J3089 Other allergic rhinitis: Secondary | ICD-10-CM | POA: Diagnosis not present

## 2016-08-04 ENCOUNTER — Other Ambulatory Visit: Payer: Self-pay | Admitting: Internal Medicine

## 2016-08-04 DIAGNOSIS — J301 Allergic rhinitis due to pollen: Secondary | ICD-10-CM | POA: Diagnosis not present

## 2016-08-04 DIAGNOSIS — J3089 Other allergic rhinitis: Secondary | ICD-10-CM | POA: Diagnosis not present

## 2016-08-06 DIAGNOSIS — J3089 Other allergic rhinitis: Secondary | ICD-10-CM | POA: Diagnosis not present

## 2016-08-06 DIAGNOSIS — J301 Allergic rhinitis due to pollen: Secondary | ICD-10-CM | POA: Diagnosis not present

## 2016-08-11 DIAGNOSIS — J3089 Other allergic rhinitis: Secondary | ICD-10-CM | POA: Diagnosis not present

## 2016-08-11 DIAGNOSIS — J301 Allergic rhinitis due to pollen: Secondary | ICD-10-CM | POA: Diagnosis not present

## 2016-08-13 DIAGNOSIS — J301 Allergic rhinitis due to pollen: Secondary | ICD-10-CM | POA: Diagnosis not present

## 2016-08-13 DIAGNOSIS — J3089 Other allergic rhinitis: Secondary | ICD-10-CM | POA: Diagnosis not present

## 2016-08-18 DIAGNOSIS — J301 Allergic rhinitis due to pollen: Secondary | ICD-10-CM | POA: Diagnosis not present

## 2016-08-18 DIAGNOSIS — J3089 Other allergic rhinitis: Secondary | ICD-10-CM | POA: Diagnosis not present

## 2016-08-20 DIAGNOSIS — J3089 Other allergic rhinitis: Secondary | ICD-10-CM | POA: Diagnosis not present

## 2016-08-20 DIAGNOSIS — J301 Allergic rhinitis due to pollen: Secondary | ICD-10-CM | POA: Diagnosis not present

## 2016-08-25 DIAGNOSIS — J301 Allergic rhinitis due to pollen: Secondary | ICD-10-CM | POA: Diagnosis not present

## 2016-08-25 DIAGNOSIS — J3089 Other allergic rhinitis: Secondary | ICD-10-CM | POA: Diagnosis not present

## 2016-08-27 DIAGNOSIS — J301 Allergic rhinitis due to pollen: Secondary | ICD-10-CM | POA: Diagnosis not present

## 2016-08-27 DIAGNOSIS — J3089 Other allergic rhinitis: Secondary | ICD-10-CM | POA: Diagnosis not present

## 2016-09-01 DIAGNOSIS — J3089 Other allergic rhinitis: Secondary | ICD-10-CM | POA: Diagnosis not present

## 2016-09-01 DIAGNOSIS — J301 Allergic rhinitis due to pollen: Secondary | ICD-10-CM | POA: Diagnosis not present

## 2016-09-03 DIAGNOSIS — J301 Allergic rhinitis due to pollen: Secondary | ICD-10-CM | POA: Diagnosis not present

## 2016-09-03 DIAGNOSIS — J3089 Other allergic rhinitis: Secondary | ICD-10-CM | POA: Diagnosis not present

## 2016-09-08 DIAGNOSIS — J301 Allergic rhinitis due to pollen: Secondary | ICD-10-CM | POA: Diagnosis not present

## 2016-09-08 DIAGNOSIS — J3089 Other allergic rhinitis: Secondary | ICD-10-CM | POA: Diagnosis not present

## 2016-09-11 DIAGNOSIS — J301 Allergic rhinitis due to pollen: Secondary | ICD-10-CM | POA: Diagnosis not present

## 2016-09-11 DIAGNOSIS — J3089 Other allergic rhinitis: Secondary | ICD-10-CM | POA: Diagnosis not present

## 2016-09-15 DIAGNOSIS — J3089 Other allergic rhinitis: Secondary | ICD-10-CM | POA: Diagnosis not present

## 2016-09-15 DIAGNOSIS — J301 Allergic rhinitis due to pollen: Secondary | ICD-10-CM | POA: Diagnosis not present

## 2016-09-18 ENCOUNTER — Encounter: Payer: Self-pay | Admitting: Internal Medicine

## 2016-09-18 ENCOUNTER — Ambulatory Visit (INDEPENDENT_AMBULATORY_CARE_PROVIDER_SITE_OTHER): Payer: Federal, State, Local not specified - PPO | Admitting: Internal Medicine

## 2016-09-18 ENCOUNTER — Ambulatory Visit: Payer: Federal, State, Local not specified - PPO | Admitting: Internal Medicine

## 2016-09-18 VITALS — BP 152/88 | HR 91 | Temp 98.1°F | Ht 66.0 in | Wt 229.0 lb

## 2016-09-18 DIAGNOSIS — J301 Allergic rhinitis due to pollen: Secondary | ICD-10-CM | POA: Diagnosis not present

## 2016-09-18 DIAGNOSIS — I10 Essential (primary) hypertension: Secondary | ICD-10-CM

## 2016-09-18 DIAGNOSIS — J3089 Other allergic rhinitis: Secondary | ICD-10-CM | POA: Diagnosis not present

## 2016-09-18 NOTE — Patient Instructions (Addendum)
Limit your sodium (Salt) intake  Please check your blood pressure on a regular basis.  If it is consistently greater than 150/90, please make an office appointment.    It is important that you exercise regularly, at least 20 minutes 3 to 4 times per week.  If you develop chest pain or shortness of breath seek  medical attention.  Return in 6 months for follow-up   Hydrate and Humidify  Drink enough water to keep your urine clear or pale yellow. Staying hydrated will help to thin your mucus.  Use a cool mist humidifier to keep the humidity level in your home above 50%.  Inhale steam for 10-15 minutes, 3-4 times a day or as told by your health care provider. You can do this in the bathroom while a hot shower is running.  Limit your exposure to cool or dry air. Rest  Rest as much as possible.

## 2016-09-18 NOTE — Progress Notes (Signed)
Subjective:    Patient ID: Catherine Campbell, female    DOB: Aug 04, 1966, 51 y.o.   MRN: [id number]  HPI  51 year old patient who has a history of allergic rhinitis and essential hypertension.  For the past few days she has had some URI symptoms with increasing sinus and chest congestion with cough.  Remains on her maintenance allergy medications.  No wheezing or shortness of breath.  Past Medical History:  Diagnosis Date  . ASCUS with positive high risk human papillomavirus of vagina 06/2013   Colposcopic biopsy koilocytotic atypia.  . Asthma   . Hypertension   . Low grade squamous intraepithelial lesion (LGSIL) 06/2014, 06/2015, 06/2016   positive high-risk HPV. Colposcopic biopsy LGSIL negative [facility] 2015,  [facility] 2017 with koilocytotic atypia changes     Social History   Social History  . Marital status: Single    Spouse name: N/A  . Number of children: N/A  . Years of education: N/A   Occupational History  . Not on file.   Social History Main Topics  . Smoking status: Never Smoker  . Smokeless tobacco: Never Used  . Alcohol use No  . Drug use: No  . Sexual activity: No     Comment: 1st intercourse 51 yo-Fewer than 5 partners   Other Topics Concern  . Not on file   Social History Narrative  . No narrative on file    Past Surgical History:  Procedure Laterality Date  . BOIL UNDER ARM      Family History  Problem Relation Age of Onset  . Diabetes Father   . Hypertension Father   . Hypertension Mother     Allergies  Allergen Reactions  . Metaxalone Hives  . Penicillins Hives  . Tramadol-Acetaminophen Hives    Current Outpatient Prescriptions on File Prior to Visit  Medication Sig Dispense Refill  . ADVAIR DISKUS 250-50 MCG/DOSE AEPB Inhale 1 puff into the lungs 2 (two) times daily.  4  . azelastine (ASTELIN) 0.1 % nasal spray INSTILL 1 PUFF IN EACH NOSTRIL TWICE A DAY NASALLY 30 DAYS  3  . cetirizine (ZYRTEC) 10 MG tablet Take 10 mg by mouth daily.    Marland Kitchen  EPIPEN 2-PAK 0.3 MG/0.3ML SOAJ injection Reported on 09/24/2015  0  . fexofenadine (ALLEGRA) 180 MG tablet Take 180 mg by mouth daily.      . fluticasone (FLONASE) 50 MCG/ACT nasal spray INSTILL 2 SPRAYS IN EACH NOSTRIL ONCE A DAY NASALLY 30 DAYS  3  . ibuprofen (ADVIL,MOTRIN) 200 MG tablet Take 200 mg by mouth every 6 (six) hours as needed.    Marland Kitchen losartan (COZAAR) 100 MG tablet TAKE 1 TABLET (100 MG TOTAL) BY MOUTH DAILY. 90 tablet 1  . montelukast (SINGULAIR) 10 MG tablet Take 10 mg by mouth at bedtime.     Marland Kitchen UNABLE TO FIND Allergy shots once a week     No current facility-administered medications on file prior to visit.     BP (!) 152/88 (BP Location: Left Arm, Patient Position: Sitting, Cuff Size: Normal)   Pulse 91   Temp 98.1 F (36.7 C) (Oral)   Ht 5\' 6"  (1.676 m)   Wt 229 lb (103.9 kg)   SpO2 99%   BMI 36.96 kg/m      Review of Systems  Constitutional: Positive for activity change, appetite change and fatigue.  HENT: Positive for congestion. Negative for dental problem, hearing loss, rhinorrhea, sinus pressure, sore throat and tinnitus.   Eyes: Negative for  pain, discharge and visual disturbance.  Respiratory: Positive for cough. Negative for shortness of breath.   Cardiovascular: Negative for chest pain, palpitations and leg swelling.  Gastrointestinal: Negative for abdominal distention, abdominal pain, blood in stool, constipation, diarrhea, nausea and vomiting.  Genitourinary: Negative for difficulty urinating, dysuria, flank pain, frequency, hematuria, pelvic pain, urgency, vaginal bleeding, vaginal discharge and vaginal pain.  Musculoskeletal: Negative for arthralgias, gait problem and joint swelling.  Skin: Negative for rash.  Neurological: Negative for dizziness, syncope, speech difficulty, weakness, numbness and headaches.  Hematological: Negative for adenopathy.  Psychiatric/Behavioral: Negative for agitation, behavioral problems and dysphoric mood. The patient is  not nervous/anxious.        Objective:   Physical Exam  Constitutional: She is oriented to person, place, and time. She appears well-developed and well-nourished.  Repeat blood pressure 120/82  HENT:  Head: Normocephalic.  Right Ear: External ear normal.  Left Ear: External ear normal.  Mouth/Throat: Oropharynx is clear and moist.  Eyes: Conjunctivae and EOM are normal. Pupils are equal, round, and reactive to light.  Neck: Normal range of motion. Neck supple. No thyromegaly present.  Cardiovascular: Normal rate, regular rhythm, normal heart sounds and intact distal pulses.   Pulmonary/Chest: Effort normal and breath sounds normal. No respiratory distress. She has no wheezes. She has no rales.  Abdominal: Soft. Bowel sounds are normal. She exhibits no mass. There is no tenderness.  Musculoskeletal: Normal range of motion.  Lymphadenopathy:    She has no cervical adenopathy.  Neurological: She is alert and oriented to person, place, and time.  Skin: Skin is warm and dry. No rash noted.  Psychiatric: She has a normal mood and affect. Her behavior is normal.          Assessment & Plan:   Essential hypertension.  Repeat blood pressure 120/82 Allergic rhinitis Viral URI.  Will treat symptomatically  No change in medical regimen  CPX 6 months [name] Homero Fellers

## 2016-09-18 NOTE — Progress Notes (Signed)
Pre visit review using our clinic review tool, if applicable. No additional management support is needed unless otherwise documented below in the visit note. 

## 2016-09-22 DIAGNOSIS — J3089 Other allergic rhinitis: Secondary | ICD-10-CM | POA: Diagnosis not present

## 2016-09-22 DIAGNOSIS — J301 Allergic rhinitis due to pollen: Secondary | ICD-10-CM | POA: Diagnosis not present

## 2016-09-24 DIAGNOSIS — J301 Allergic rhinitis due to pollen: Secondary | ICD-10-CM | POA: Diagnosis not present

## 2016-09-24 DIAGNOSIS — J3089 Other allergic rhinitis: Secondary | ICD-10-CM | POA: Diagnosis not present

## 2016-09-29 DIAGNOSIS — J301 Allergic rhinitis due to pollen: Secondary | ICD-10-CM | POA: Diagnosis not present

## 2016-09-29 DIAGNOSIS — J3089 Other allergic rhinitis: Secondary | ICD-10-CM | POA: Diagnosis not present

## 2016-10-01 DIAGNOSIS — J3089 Other allergic rhinitis: Secondary | ICD-10-CM | POA: Diagnosis not present

## 2016-10-01 DIAGNOSIS — J301 Allergic rhinitis due to pollen: Secondary | ICD-10-CM | POA: Diagnosis not present

## 2016-10-08 DIAGNOSIS — J00 Acute nasopharyngitis [common cold]: Secondary | ICD-10-CM | POA: Diagnosis not present

## 2016-10-08 DIAGNOSIS — J069 Acute upper respiratory infection, unspecified: Secondary | ICD-10-CM | POA: Diagnosis not present

## 2016-10-12 ENCOUNTER — Encounter: Payer: Self-pay | Admitting: Gynecology

## 2016-10-12 DIAGNOSIS — Z1231 Encounter for screening mammogram for malignant neoplasm of breast: Secondary | ICD-10-CM | POA: Diagnosis not present

## 2016-10-12 DIAGNOSIS — J3089 Other allergic rhinitis: Secondary | ICD-10-CM | POA: Diagnosis not present

## 2016-10-12 DIAGNOSIS — J301 Allergic rhinitis due to pollen: Secondary | ICD-10-CM | POA: Diagnosis not present

## 2016-10-14 DIAGNOSIS — J3089 Other allergic rhinitis: Secondary | ICD-10-CM | POA: Diagnosis not present

## 2016-10-14 DIAGNOSIS — J301 Allergic rhinitis due to pollen: Secondary | ICD-10-CM | POA: Diagnosis not present

## 2016-10-20 DIAGNOSIS — J3089 Other allergic rhinitis: Secondary | ICD-10-CM | POA: Diagnosis not present

## 2016-10-20 DIAGNOSIS — J301 Allergic rhinitis due to pollen: Secondary | ICD-10-CM | POA: Diagnosis not present

## 2016-10-27 DIAGNOSIS — J3089 Other allergic rhinitis: Secondary | ICD-10-CM | POA: Diagnosis not present

## 2016-10-27 DIAGNOSIS — J301 Allergic rhinitis due to pollen: Secondary | ICD-10-CM | POA: Diagnosis not present

## 2016-10-30 DIAGNOSIS — J301 Allergic rhinitis due to pollen: Secondary | ICD-10-CM | POA: Diagnosis not present

## 2016-10-30 DIAGNOSIS — J3089 Other allergic rhinitis: Secondary | ICD-10-CM | POA: Diagnosis not present

## 2016-11-05 DIAGNOSIS — J301 Allergic rhinitis due to pollen: Secondary | ICD-10-CM | POA: Diagnosis not present

## 2016-11-05 DIAGNOSIS — J3089 Other allergic rhinitis: Secondary | ICD-10-CM | POA: Diagnosis not present

## 2016-11-10 DIAGNOSIS — J3089 Other allergic rhinitis: Secondary | ICD-10-CM | POA: Diagnosis not present

## 2016-11-10 DIAGNOSIS — J301 Allergic rhinitis due to pollen: Secondary | ICD-10-CM | POA: Diagnosis not present

## 2016-11-17 DIAGNOSIS — J3089 Other allergic rhinitis: Secondary | ICD-10-CM | POA: Diagnosis not present

## 2016-11-17 DIAGNOSIS — J301 Allergic rhinitis due to pollen: Secondary | ICD-10-CM | POA: Diagnosis not present

## 2016-11-23 ENCOUNTER — Encounter: Payer: Self-pay | Admitting: Family Medicine

## 2016-11-23 ENCOUNTER — Ambulatory Visit (INDEPENDENT_AMBULATORY_CARE_PROVIDER_SITE_OTHER): Payer: Federal, State, Local not specified - PPO | Admitting: Family Medicine

## 2016-11-23 VITALS — BP 128/90 | HR 98 | Temp 98.1°F | Wt 222.6 lb

## 2016-11-23 DIAGNOSIS — R059 Cough, unspecified: Secondary | ICD-10-CM

## 2016-11-23 DIAGNOSIS — J0140 Acute pansinusitis, unspecified: Secondary | ICD-10-CM

## 2016-11-23 DIAGNOSIS — R05 Cough: Secondary | ICD-10-CM | POA: Diagnosis not present

## 2016-11-23 MED ORDER — DOXYCYCLINE HYCLATE 100 MG PO TABS: 100.0000 mg | ORAL_TABLET | Freq: Two times a day (BID) | ORAL | 0 refills | Status: DC

## 2016-11-23 MED ORDER — PREDNISONE 10 MG PO TABS: ORAL_TABLET | ORAL | 0 refills | Status: DC

## 2016-11-23 MED ORDER — PROMETHAZINE-DM 6.25-15 MG/5ML PO SYRP: 5.0000 mL | ORAL_SOLUTION | Freq: Four times a day (QID) | ORAL | 0 refills | Status: DC | PRN

## 2016-11-23 NOTE — Progress Notes (Signed)
Pre visit review using our clinic review tool, if applicable. No additional management support is needed unless otherwise documented below in the visit note. 

## 2016-11-23 NOTE — Progress Notes (Signed)
Patient ID: Catherine Campbell, female   DOB: 1965/11/16, 51 y.o.   MRN: [id number]  PCP: Rogelia Boga, MD  Subjective:  Catherine Campbell is a 51 y.o. year old very pleasant female patient who presents with Upper Respiratory infection symptoms including nasal congestion, nonproductive cough, rhinitis with clear drainage, low grade fever and chills per patient report. -started: 2 weeks ago, symptoms  -previous treatments: Mucinex with treatment of allergies. -sick contacts/travel/risks: denies flu exposure. She denies influenza vaccine this year; No recent sick contact exposure -Hx of: allergies with multiple therapies No recent antibiotic use No aggravating or alleviating factors noted  ROS-denies SOB, myalgias, sore throat, NVD, tooth pain  Pertinent Past Medical History-  Medications- reviewed  Current Outpatient Prescriptions  Medication Sig Dispense Refill  . ADVAIR DISKUS 250-50 MCG/DOSE AEPB Inhale 1 puff into the lungs 2 (two) times daily.  4  . azelastine (ASTELIN) 0.1 % nasal spray INSTILL 1 PUFF IN EACH NOSTRIL TWICE A DAY NASALLY 30 DAYS  3  . cetirizine (ZYRTEC) 10 MG tablet Take 10 mg by mouth daily.    Marland Kitchen EPIPEN 2-PAK 0.3 MG/0.3ML SOAJ injection Reported on 09/24/2015  0  . fexofenadine (ALLEGRA) 180 MG tablet Take 180 mg by mouth daily.      . fluticasone (FLONASE) 50 MCG/ACT nasal spray INSTILL 2 SPRAYS IN EACH NOSTRIL ONCE A DAY NASALLY 30 DAYS  3  . ibuprofen (ADVIL,MOTRIN) 200 MG tablet Take 200 mg by mouth every 6 (six) hours as needed.    Marland Kitchen losartan (COZAAR) 100 MG tablet TAKE 1 TABLET (100 MG TOTAL) BY MOUTH DAILY. 90 tablet 1  . montelukast (SINGULAIR) 10 MG tablet Take 10 mg by mouth at bedtime.     Marland Kitchen UNABLE TO FIND Allergy shots once a week     No current facility-administered medications for this visit.     Objective: BP 128/90 (BP Location: Left Arm, Patient Position: Sitting, Cuff Size: Normal)   Pulse 98   Temp 98.1 F (36.7 C) (Oral)   Wt 222 lb  9.6 oz (101 kg)   SpO2 97%   BMI 35.93 kg/m  Gen: NAD, resting comfortably HEENT: Turbinates erythematous, TM normal, pharynx mildly erythematous with no tonsilar exudate or edema, + frontal and maxillary fsinus tenderness maxillay > frontal CV: RRR no murmurs rubs or gallops Lungs: CTAB no crackles, wheeze, rhonchi Abdomen: soft/nontender/nondistended/normal bowel sounds. No rebound or guarding.  Ext: no edema Skin: warm, dry, no rash Neuro: grossly normal, moves all extremities  Assessment/Plan: 1. Acute pansinusitis, recurrence not specified Duration of treatment; history of allergic rhinitis with multiple therapy; will treat sinusitis and advised  on supportive measures:  Get rest, drink plenty of fluids, and use tylenol as needed for pain. Follow up if fever >101, if symptoms worsen or if symptoms are not improved in 3 to 4 days. Patient verbalizes understanding.   - doxycycline (VIBRA-TABS) 100 MG tablet; Take 1 tablet (100 mg total) by mouth 2 (two) times daily.  Dispense: 20 tablet; Refill: 0  2. Cough  - predniSONE (DELTASONE) 10 MG tablet; Take 4 tablets once daily for 2 days, 3 tabs daily for 2 days, 2 tabs daily for 2 days, 1 tab daily for 2 days.  Dispense: 20 tablet; Refill: 0 - promethazine-dextromethorphan (PROMETHAZINE-DM) 6.25-15 MG/5ML syrup; Take 5 mLs by mouth 4 (four) times daily as needed for cough.  Dispense: 118 mL; Refill: 0  Advised continued treatment of allergic rhinitis.   Inez Catalina, FNP

## 2016-11-23 NOTE — Patient Instructions (Addendum)
It was a pleasure to see you today. Please take medication as directed and follow up if symptoms do not improve with treatment, worsen, or you develop new symptoms such as fever >100 or shortness of breath.   Sinusitis, Adult Sinusitis is soreness and inflammation of your sinuses. Sinuses are hollow spaces in the bones around your face. They are located:  Around your eyes.  In the middle of your forehead.  Behind your nose.  In your cheekbones. Your sinuses and nasal passages are lined with a stringy fluid (mucus). Mucus normally drains out of your sinuses. When your nasal tissues get inflamed or swollen, the mucus can get trapped or blocked so air cannot flow through your sinuses. This lets bacteria, viruses, and funguses grow, and that leads to infection. Follow these instructions at home: Medicines   Take, use, or apply over-the-counter and prescription medicines only as told by your doctor. These may include nasal sprays.  If you were prescribed an antibiotic medicine, take it as told by your doctor. Do not stop taking the antibiotic even if you start to feel better. Hydrate and Humidify   Drink enough water to keep your pee (urine) clear or pale yellow.  Use a cool mist humidifier to keep the humidity level in your home above 50%.  Breathe in steam for 10-15 minutes, 3-4 times a day or as told by your doctor. You can do this in the bathroom while a hot shower is running.  Try not to spend time in cool or dry air. Rest   Rest as much as possible.  Sleep with your head raised (elevated).  Make sure to get enough sleep each night. General instructions   Put a warm, moist washcloth on your face 3-4 times a day or as told by your doctor. This will help with discomfort.  Wash your hands often with soap and water. If there is no soap and water, use hand sanitizer.  Do not smoke. Avoid being around people who are smoking (secondhand smoke).  Keep all follow-up visits as told  by your doctor. This is important. Contact a doctor if:  You have a fever.  Your symptoms get worse.  Your symptoms do not get better within 10 days. Get help right away if:  You have a very bad headache.  You cannot stop throwing up (vomiting).  You have pain or swelling around your face or eyes.  You have trouble seeing.  You feel confused.  Your neck is stiff.  You have trouble breathing. This information is not intended to replace advice given to you by your health care provider. Make sure you discuss any questions you have with your health care provider. Document Released: 01/27/2008 Document Revised: 04/05/2016 Document Reviewed: 06/05/2015 Elsevier Interactive Patient Education  2017 Elsevier Inc.   WE NOW OFFER   [facility] FAST TRACK!!!  SAME DAY Appointments for ACUTE CARE  Such as: Sprains, Injuries, cuts, abrasions, rashes, muscle pain, joint pain, back pain Colds, flu, sore throats, headache, allergies, cough, fever  Ear pain, sinus and eye infections Abdominal pain, nausea, vomiting, diarrhea, upset stomach Animal/insect bites  3 Easy Ways to Schedule: Walk-In Scheduling Call in scheduling Mychart Sign-up: [email or web address]

## 2016-11-24 ENCOUNTER — Telehealth: Payer: Self-pay | Admitting: Internal Medicine

## 2016-11-24 NOTE — Telephone Encounter (Signed)
Catherine Campbell pt is calling stating that she is not feeling any better and would like to see if Amil Amen would write her a note for today due to being out today. Pt is aware that Amil Amen is not in the office but will let her work know that she is waiting on a note for today so that when she goes back to on Wednesday she stated that she will let them know that Amil Amen is out and will get the note on Thursday.  Pt would like to have a call Thursday when note is ready for pick-up.

## 2016-11-24 NOTE — Telephone Encounter (Signed)
Noted. Will call patient on Thursday as requested.

## 2016-11-26 DIAGNOSIS — J3089 Other allergic rhinitis: Secondary | ICD-10-CM | POA: Diagnosis not present

## 2016-11-26 DIAGNOSIS — J301 Allergic rhinitis due to pollen: Secondary | ICD-10-CM | POA: Diagnosis not present

## 2016-11-26 NOTE — Telephone Encounter (Signed)
Patient came by the office for the work note today at 5pm.

## 2016-11-26 NOTE — Telephone Encounter (Signed)
Pt calling to see if Catherine Campbell could add on Wednesday due to her not feeling any better she went back today (Thursday) and would like to see if she could come and pick it up after 3:30 today.

## 2016-12-01 DIAGNOSIS — J3089 Other allergic rhinitis: Secondary | ICD-10-CM | POA: Diagnosis not present

## 2016-12-01 DIAGNOSIS — J301 Allergic rhinitis due to pollen: Secondary | ICD-10-CM | POA: Diagnosis not present

## 2016-12-03 DIAGNOSIS — J301 Allergic rhinitis due to pollen: Secondary | ICD-10-CM | POA: Diagnosis not present

## 2016-12-03 DIAGNOSIS — J3089 Other allergic rhinitis: Secondary | ICD-10-CM | POA: Diagnosis not present

## 2016-12-08 DIAGNOSIS — J3089 Other allergic rhinitis: Secondary | ICD-10-CM | POA: Diagnosis not present

## 2016-12-08 DIAGNOSIS — J301 Allergic rhinitis due to pollen: Secondary | ICD-10-CM | POA: Diagnosis not present

## 2016-12-15 DIAGNOSIS — J301 Allergic rhinitis due to pollen: Secondary | ICD-10-CM | POA: Diagnosis not present

## 2016-12-15 DIAGNOSIS — J3089 Other allergic rhinitis: Secondary | ICD-10-CM | POA: Diagnosis not present

## 2016-12-17 DIAGNOSIS — J301 Allergic rhinitis due to pollen: Secondary | ICD-10-CM | POA: Diagnosis not present

## 2016-12-17 DIAGNOSIS — J3089 Other allergic rhinitis: Secondary | ICD-10-CM | POA: Diagnosis not present

## 2016-12-24 ENCOUNTER — Encounter: Payer: Self-pay | Admitting: Gynecology

## 2016-12-24 ENCOUNTER — Ambulatory Visit (INDEPENDENT_AMBULATORY_CARE_PROVIDER_SITE_OTHER): Payer: Federal, State, Local not specified - PPO | Admitting: Gynecology

## 2016-12-24 ENCOUNTER — Ambulatory Visit: Payer: Federal, State, Local not specified - PPO | Admitting: Gynecology

## 2016-12-24 VITALS — BP 124/82

## 2016-12-24 DIAGNOSIS — R21 Rash and other nonspecific skin eruption: Secondary | ICD-10-CM | POA: Diagnosis not present

## 2016-12-24 DIAGNOSIS — R87810 Cervical high risk human papillomavirus (HPV) DNA test positive: Secondary | ICD-10-CM | POA: Diagnosis not present

## 2016-12-24 DIAGNOSIS — R87612 Low grade squamous intraepithelial lesion on cytologic smear of cervix (LGSIL): Secondary | ICD-10-CM

## 2016-12-24 DIAGNOSIS — J301 Allergic rhinitis due to pollen: Secondary | ICD-10-CM | POA: Diagnosis not present

## 2016-12-24 DIAGNOSIS — R87610 Atypical squamous cells of undetermined significance on cytologic smear of cervix (ASC-US): Secondary | ICD-10-CM | POA: Diagnosis not present

## 2016-12-24 DIAGNOSIS — J3089 Other allergic rhinitis: Secondary | ICD-10-CM | POA: Diagnosis not present

## 2016-12-24 DIAGNOSIS — N898 Other specified noninflammatory disorders of vagina: Secondary | ICD-10-CM | POA: Diagnosis not present

## 2016-12-24 LAB — WET PREP FOR TRICH, YEAST, CLUE
Clue Cells Wet Prep HPF POC: NONE SEEN
Trich, Wet Prep: NONE SEEN
Yeast Wet Prep HPF POC: NONE SEEN

## 2016-12-24 MED ORDER — FLUCONAZOLE 150 MG PO TABS: 150.0000 mg | ORAL_TABLET | Freq: Once | ORAL | 0 refills | Status: AC

## 2016-12-24 MED ORDER — NYSTATIN-TRIAMCINOLONE 100000-0.1 UNIT/GM-% EX OINT: 1.0000 "application " | TOPICAL_OINTMENT | Freq: Two times a day (BID) | CUTANEOUS | 1 refills | Status: DC

## 2016-12-24 NOTE — Patient Instructions (Signed)
Take the Diflucan pill daily for 3 days. Apply the Mycolog skin cream to the external rash twice daily as needed. Follow up for the Pap smear results

## 2016-12-24 NOTE — Progress Notes (Signed)
Catherine Campbell 01/08/1966 [id number]        51 y.o.  G0P0 presents or Pap smear. Has a long history of low-grade changes with positive high-risk HPV. Colposcopy last year in January showed koilocytotic atypia on ECC. Had annual exam November where Pap smear showed LGSIL with positive high-risk HPV. Patient notes recent treatment with antibiotics and now is having a rash in her vulvar area. No vaginal discharge.  Past medical history,surgical history, problem list, medications, allergies, family history and social history were all reviewed and documented in the EPIC chart.  Directed ROS with pertinent positives and negatives documented in the history of present illness/assessment and plan.  Exam: [name] assistant Vitals:   12/24/16 1429  BP: 124/82   General appearance:  Normal Abdomen soft nontender without masses guarding rebound. Pelvic external BUS vagina with fungal type rash in both groin creases. White discharge noted. Cervix normal. Pap smear done. Uterus normal size midline mobile nontender. Adnexa without masses or tenderness.  Assessment/Plan:  51 y.o. G0P0 with:  1. Long history of LGSIL positive high-risk HPV. Will follow up for Pap smear results and triage based upon these results. 2. Groin skin rash. Wet prep was negative for yeast. Given the classic appearance we'll treat with Diflucan 150 mg daily 3 days and Mycolog ointment twice daily as needed. Follow up if rash continues.    [name], 2:40 PM 12/24/2016

## 2016-12-24 NOTE — Addendum Note (Signed)
Addended by: Dayna BarkerGARDNER, KIMBERLY K on: 12/24/2016 03:23 PM   Modules accepted: Orders

## 2016-12-24 NOTE — Addendum Note (Signed)
Addended by: Dayna BarkerGARDNER, Kianna Billet K on: 12/24/2016 03:05 PM   Modules accepted: Orders

## 2016-12-25 LAB — PAP IG W/ RFLX HPV ASCU

## 2016-12-28 LAB — HUMAN PAPILLOMAVIRUS, HIGH RISK: HPV DNA High Risk: DETECTED — AB

## 2016-12-29 ENCOUNTER — Encounter: Payer: Self-pay | Admitting: Gynecology

## 2017-01-07 ENCOUNTER — Ambulatory Visit: Payer: Federal, State, Local not specified - PPO | Admitting: Gynecology

## 2017-01-07 DIAGNOSIS — J301 Allergic rhinitis due to pollen: Secondary | ICD-10-CM | POA: Diagnosis not present

## 2017-01-07 DIAGNOSIS — J3089 Other allergic rhinitis: Secondary | ICD-10-CM | POA: Diagnosis not present

## 2017-01-08 ENCOUNTER — Ambulatory Visit: Payer: Federal, State, Local not specified - PPO | Admitting: Gynecology

## 2017-01-15 DIAGNOSIS — J3089 Other allergic rhinitis: Secondary | ICD-10-CM | POA: Diagnosis not present

## 2017-01-15 DIAGNOSIS — J301 Allergic rhinitis due to pollen: Secondary | ICD-10-CM | POA: Diagnosis not present

## 2017-01-15 DIAGNOSIS — H1045 Other chronic allergic conjunctivitis: Secondary | ICD-10-CM | POA: Diagnosis not present

## 2017-01-15 DIAGNOSIS — J4540 Moderate persistent asthma, uncomplicated: Secondary | ICD-10-CM | POA: Diagnosis not present

## 2017-01-21 DIAGNOSIS — J3089 Other allergic rhinitis: Secondary | ICD-10-CM | POA: Diagnosis not present

## 2017-01-21 DIAGNOSIS — J301 Allergic rhinitis due to pollen: Secondary | ICD-10-CM | POA: Diagnosis not present

## 2017-01-25 DIAGNOSIS — J209 Acute bronchitis, unspecified: Secondary | ICD-10-CM | POA: Diagnosis not present

## 2017-01-25 DIAGNOSIS — J019 Acute sinusitis, unspecified: Secondary | ICD-10-CM | POA: Diagnosis not present

## 2017-01-25 DIAGNOSIS — J3089 Other allergic rhinitis: Secondary | ICD-10-CM | POA: Diagnosis not present

## 2017-01-28 DIAGNOSIS — J301 Allergic rhinitis due to pollen: Secondary | ICD-10-CM | POA: Diagnosis not present

## 2017-01-28 DIAGNOSIS — J3089 Other allergic rhinitis: Secondary | ICD-10-CM | POA: Diagnosis not present

## 2017-01-31 ENCOUNTER — Other Ambulatory Visit: Payer: Self-pay | Admitting: Internal Medicine

## 2017-02-01 ENCOUNTER — Ambulatory Visit (INDEPENDENT_AMBULATORY_CARE_PROVIDER_SITE_OTHER): Payer: Federal, State, Local not specified - PPO | Admitting: Gynecology

## 2017-02-01 ENCOUNTER — Encounter: Payer: Self-pay | Admitting: Gynecology

## 2017-02-01 VITALS — BP 122/78

## 2017-02-01 DIAGNOSIS — R87610 Atypical squamous cells of undetermined significance on cytologic smear of cervix (ASC-US): Secondary | ICD-10-CM

## 2017-02-01 DIAGNOSIS — R87810 Cervical high risk human papillomavirus (HPV) DNA test positive: Secondary | ICD-10-CM

## 2017-02-01 NOTE — Patient Instructions (Signed)
Office will call you with the biopsy results 

## 2017-02-01 NOTE — Progress Notes (Signed)
COSETTE SURACE [birth year] [id number]        51 y.o.  G0P0 with long history of LGSIL with positive high-risk HPV. Colposcopy January 2017 showed koilocytotic atypia on the ECC but otherwise negative. Most recent Pap smear showed ASCUS with positive high-risk HPV.  Past medical history,surgical history, problem list, medications, allergies, family history and social history were all reviewed and documented in the EPIC chart.  Directed ROS with pertinent positives and negatives documented in the history of present illness/assessment and plan.  Exam: [name] assistant Vitals:   02/01/17 1607  BP: 122/78   General appearance:  Normal Abdomen soft nontender without masses guarding rebound Pelvic external BUS vagina normal. Cervix grossly normal. Uterus normal size midline mobile nontender. Adnexa without masses or tenderness.  Colposcopy performed after acetic acid cleanse is adequate with no abnormalities seen. ECC performed. Physical Exam  Genitourinary:       Assessment/Plan:  50 y.o. G0P0 with long history of LGSIL with positive high-risk HPV. Colposcopy today is normal. ECC performed. If normal or low-grade plan expectant management with Pap smear in one year. If otherwise them will triage based upon results. Patient and I again had a discussion about dysplasia high-grade/low-grade, progression/regression and the HPV association.    [name], 4:36 PM 02/01/2017

## 2017-02-03 ENCOUNTER — Encounter: Payer: Self-pay | Admitting: Gynecology

## 2017-02-03 LAB — PATHOLOGY

## 2017-02-11 DIAGNOSIS — J301 Allergic rhinitis due to pollen: Secondary | ICD-10-CM | POA: Diagnosis not present

## 2017-02-11 DIAGNOSIS — J3089 Other allergic rhinitis: Secondary | ICD-10-CM | POA: Diagnosis not present

## 2017-02-16 DIAGNOSIS — J3089 Other allergic rhinitis: Secondary | ICD-10-CM | POA: Diagnosis not present

## 2017-02-16 DIAGNOSIS — J301 Allergic rhinitis due to pollen: Secondary | ICD-10-CM | POA: Diagnosis not present

## 2017-02-18 DIAGNOSIS — J301 Allergic rhinitis due to pollen: Secondary | ICD-10-CM | POA: Diagnosis not present

## 2017-02-19 DIAGNOSIS — J3089 Other allergic rhinitis: Secondary | ICD-10-CM | POA: Diagnosis not present

## 2017-02-25 DIAGNOSIS — S93602A Unspecified sprain of left foot, initial encounter: Secondary | ICD-10-CM | POA: Diagnosis not present

## 2017-03-02 DIAGNOSIS — J301 Allergic rhinitis due to pollen: Secondary | ICD-10-CM | POA: Diagnosis not present

## 2017-03-02 DIAGNOSIS — J3089 Other allergic rhinitis: Secondary | ICD-10-CM | POA: Diagnosis not present

## 2017-03-05 DIAGNOSIS — S93602D Unspecified sprain of left foot, subsequent encounter: Secondary | ICD-10-CM | POA: Diagnosis not present

## 2017-03-09 DIAGNOSIS — J301 Allergic rhinitis due to pollen: Secondary | ICD-10-CM | POA: Diagnosis not present

## 2017-03-09 DIAGNOSIS — J3089 Other allergic rhinitis: Secondary | ICD-10-CM | POA: Diagnosis not present

## 2017-03-15 ENCOUNTER — Other Ambulatory Visit (INDEPENDENT_AMBULATORY_CARE_PROVIDER_SITE_OTHER): Payer: Federal, State, Local not specified - PPO

## 2017-03-15 DIAGNOSIS — J301 Allergic rhinitis due to pollen: Secondary | ICD-10-CM | POA: Diagnosis not present

## 2017-03-15 DIAGNOSIS — E785 Hyperlipidemia, unspecified: Secondary | ICD-10-CM

## 2017-03-15 DIAGNOSIS — Z0000 Encounter for general adult medical examination without abnormal findings: Secondary | ICD-10-CM | POA: Diagnosis not present

## 2017-03-15 DIAGNOSIS — I10 Essential (primary) hypertension: Secondary | ICD-10-CM | POA: Diagnosis not present

## 2017-03-15 DIAGNOSIS — J3089 Other allergic rhinitis: Secondary | ICD-10-CM | POA: Diagnosis not present

## 2017-03-15 LAB — BASIC METABOLIC PANEL
BUN: 13 mg/dL (ref 6–23)
CALCIUM: 8.9 mg/dL (ref 8.4–10.5)
CO2: 25 meq/L (ref 19–32)
CREATININE: 0.91 mg/dL (ref 0.40–1.20)
Chloride: 106 mEq/L (ref 96–112)
GFR: 84.16 mL/min (ref >60.00)
Glucose, Bld: 89 mg/dL (ref 70–99)
Potassium: 4 mEq/L (ref 3.5–5.1)
Sodium: 138 mEq/L (ref 135–145)

## 2017-03-15 LAB — CBC WITH DIFFERENTIAL/PLATELET
BASOS ABS: 0 10*3/uL (ref 0.0–0.1)
Basophils Relative: 0.4 % (ref 0.0–3.0)
EOS ABS: 0.1 10*3/uL (ref 0.0–0.7)
Eosinophils Relative: 2.2 % (ref 0.0–5.0)
HEMATOCRIT: 34.2 % — AB (ref 36.0–46.0)
Hemoglobin: 11.4 g/dL — ABNORMAL LOW (ref 12.0–15.0)
LYMPHS ABS: 1.4 10*3/uL (ref 0.7–4.0)
LYMPHS PCT: 25.7 % (ref 12.0–46.0)
MCHC: 33.3 g/dL (ref 30.0–36.0)
MCV: 87.8 fl (ref 78.0–100.0)
MONO ABS: 0.5 10*3/uL (ref 0.1–1.0)
Monocytes Relative: 9.4 % (ref 3.0–12.0)
NEUTROS ABS: 3.4 10*3/uL (ref 1.4–7.7)
NEUTROS PCT: 62.3 % (ref 43.0–77.0)
PLATELETS: 272 10*3/uL (ref 150.0–400.0)
RBC: 3.9 Mil/uL (ref 3.87–5.11)
RDW: 14.5 % (ref 11.5–15.5)
WBC: 5.4 10*3/uL (ref 4.0–10.5)

## 2017-03-15 LAB — LIPID PANEL
CHOL/HDL RATIO: 4
Cholesterol: 213 mg/dL — ABNORMAL HIGH (ref 0–200)
HDL: 48.8 mg/dL (ref >39.00)
LDL CALC: 148 mg/dL — AB (ref 0–99)
NONHDL: 164.06
TRIGLYCERIDES: 80 mg/dL (ref 0.0–149.0)
VLDL: 16 mg/dL (ref 0.0–40.0)

## 2017-03-15 LAB — HEPATIC FUNCTION PANEL
ALK PHOS: 70 U/L (ref 39–117)
ALT: 8 U/L (ref 0–35)
AST: 12 U/L (ref 0–37)
Albumin: 3.6 g/dL (ref 3.5–5.2)
BILIRUBIN DIRECT: 0.1 mg/dL (ref 0.0–0.3)
BILIRUBIN TOTAL: 0.6 mg/dL (ref 0.2–1.2)
Total Protein: 6.8 g/dL (ref 6.0–8.3)

## 2017-03-15 LAB — POC URINALSYSI DIPSTICK (AUTOMATED)
BILIRUBIN UA: NEGATIVE
GLUCOSE UA: NEGATIVE
Ketones, UA: NEGATIVE
Leukocytes, UA: NEGATIVE
Nitrite, UA: NEGATIVE
Protein, UA: NEGATIVE
RBC UA: NEGATIVE
SPEC GRAV UA: 1.015 (ref 1.010–1.025)
Urobilinogen, UA: 0.2 E.U./dL
pH, UA: 6 (ref 5.0–8.0)

## 2017-03-15 LAB — TSH: TSH: 2.42 u[IU]/mL (ref 0.35–4.50)

## 2017-03-17 DIAGNOSIS — J3089 Other allergic rhinitis: Secondary | ICD-10-CM | POA: Diagnosis not present

## 2017-03-17 DIAGNOSIS — J301 Allergic rhinitis due to pollen: Secondary | ICD-10-CM | POA: Diagnosis not present

## 2017-03-22 ENCOUNTER — Encounter: Payer: Federal, State, Local not specified - PPO | Admitting: Internal Medicine

## 2017-03-24 DIAGNOSIS — J3089 Other allergic rhinitis: Secondary | ICD-10-CM | POA: Diagnosis not present

## 2017-03-24 DIAGNOSIS — J301 Allergic rhinitis due to pollen: Secondary | ICD-10-CM | POA: Diagnosis not present

## 2017-03-26 ENCOUNTER — Ambulatory Visit (INDEPENDENT_AMBULATORY_CARE_PROVIDER_SITE_OTHER): Payer: Federal, State, Local not specified - PPO | Admitting: Internal Medicine

## 2017-03-26 ENCOUNTER — Encounter: Payer: Self-pay | Admitting: Internal Medicine

## 2017-03-26 ENCOUNTER — Encounter: Payer: Self-pay | Admitting: Gastroenterology

## 2017-03-26 VITALS — BP 142/88 | HR 94 | Temp 98.2°F | Ht 66.0 in | Wt 231.0 lb

## 2017-03-26 DIAGNOSIS — Z0000 Encounter for general adult medical examination without abnormal findings: Secondary | ICD-10-CM | POA: Diagnosis not present

## 2017-03-26 DIAGNOSIS — B353 Tinea pedis: Secondary | ICD-10-CM

## 2017-03-26 DIAGNOSIS — J3089 Other allergic rhinitis: Secondary | ICD-10-CM | POA: Diagnosis not present

## 2017-03-26 DIAGNOSIS — J301 Allergic rhinitis due to pollen: Secondary | ICD-10-CM | POA: Diagnosis not present

## 2017-03-26 DIAGNOSIS — I10 Essential (primary) hypertension: Secondary | ICD-10-CM | POA: Diagnosis not present

## 2017-03-26 NOTE — Progress Notes (Signed)
Subjective:    Patient ID: Catherine Campbell, female    DOB: 1965-12-19, 51 y.o.   MRN: [id number]  HPI  51 year old patient who is seen today for a preventive health examination She is followed by OB/GYN and has had a Pap earlier this year.  She did have a mammogram at the first of the year. No prior screening colonoscopies.  She does have a history of essential hypertension and allergic rhinitis.  She has mild dyslipidemia  BP Readings from Last 3 Encounters:  03/26/17 (!) 142/88  02/01/17 122/78  12/24/16 124/82   Past Medical History:  Diagnosis Date  . ASCUS with positive high risk human papillomavirus of vagina 06/2013, 12/2016   Colposcopic biopsy koilocytotic atypia.  . Asthma   . Hypertension   . Low grade squamous intraepithelial lesion (LGSIL) 06/2014, 06/2015, 06/2016   positive high-risk HPV. Colposcopic biopsy LGSIL negative ECC 2015,  ECC 2017 with koilocytotic atypia changes     Social History   Social History  . Marital status: Single    Spouse name: N/A  . Number of children: N/A  . Years of education: N/A   Occupational History  . Not on file.   Social History Main Topics  . Smoking status: Never Smoker  . Smokeless tobacco: Never Used  . Alcohol use No  . Drug use: No  . Sexual activity: No     Comment: 1st intercourse 51 yo-Fewer than 5 partners   Other Topics Concern  . Not on file   Social History Narrative  . No narrative on file    Past Surgical History:  Procedure Laterality Date  . BOIL UNDER ARM      Family History  Problem Relation Age of Onset  . Diabetes Father   . Hypertension Father   . Hypertension Mother     Allergies  Allergen Reactions  . Metaxalone Hives  . Penicillins Hives  . Tramadol-Acetaminophen Hives    Current Outpatient Prescriptions on File Prior to Visit  Medication Sig Dispense Refill  . ADVAIR DISKUS 250-50 MCG/DOSE AEPB Inhale 1 puff into the lungs 2 (two) times daily.  4  . azelastine (ASTELIN)  0.1 % nasal spray INSTILL 1 PUFF IN EACH NOSTRIL TWICE A DAY NASALLY 30 DAYS  3  . cetirizine (ZYRTEC) 10 MG tablet Take 10 mg by mouth daily.    Marland Kitchen EPIPEN 2-PAK 0.3 MG/0.3ML SOAJ injection Reported on 09/24/2015  0  . fexofenadine (ALLEGRA) 180 MG tablet Take 180 mg by mouth daily.      . fluticasone (FLONASE) 50 MCG/ACT nasal spray INSTILL 2 SPRAYS IN EACH NOSTRIL ONCE A DAY NASALLY 30 DAYS  3  . ibuprofen (ADVIL,MOTRIN) 200 MG tablet Take 200 mg by mouth every 6 (six) hours as needed.    Marland Kitchen losartan (COZAAR) 100 MG tablet TAKE 1 TABLET (100 MG TOTAL) BY MOUTH DAILY. 90 tablet 1  . montelukast (SINGULAIR) 10 MG tablet Take 10 mg by mouth at bedtime.     Marland Kitchen nystatin-triamcinolone ointment (MYCOLOG) Apply 1 application topically 2 (two) times daily. 30 g 1  . UNABLE TO FIND Allergy shots once a week     No current facility-administered medications on file prior to visit.     BP (!) 142/88 (BP Location: Left Arm, Patient Position: Sitting, Cuff Size: Normal)   Pulse 94   Temp 98.2 F (36.8 C) (Oral)   Ht 5\' 6"  (1.676 m)   Wt 231 lb (104.8 kg)   SpO2 99%  BMI 37.28 kg/m      Review of Systems  Constitutional: Negative.   HENT: Negative for congestion, dental problem, hearing loss, rhinorrhea, sinus pressure, sore throat and tinnitus.   Eyes: Negative for pain, discharge and visual disturbance.  Respiratory: Negative for cough and shortness of breath.   Cardiovascular: Negative for chest pain, palpitations and leg swelling.  Gastrointestinal: Negative for abdominal distention, abdominal pain, blood in stool, constipation, diarrhea, nausea and vomiting.  Genitourinary: Negative for difficulty urinating, dysuria, flank pain, frequency, hematuria, pelvic pain, urgency, vaginal bleeding, vaginal discharge and vaginal pain.  Musculoskeletal: Negative for arthralgias, gait problem and joint swelling.       Recent trauma, left foot  Skin: Negative for rash.  Neurological: Negative for  dizziness, syncope, speech difficulty, weakness, numbness and headaches.  Hematological: Negative for adenopathy.  Psychiatric/Behavioral: Negative for agitation, behavioral problems and dysphoric mood. The patient is not nervous/anxious.        Objective:   Physical Exam  Constitutional: She is oriented to person, place, and time. She appears well-developed and well-nourished.  HENT:  Head: Normocephalic and atraumatic.  Right Ear: External ear normal.  Left Ear: External ear normal.  Mouth/Throat: Oropharynx is clear and moist.  Eyes: Conjunctivae and EOM are normal.  Neck: Normal range of motion. Neck supple. No JVD present. No thyromegaly present.  Cardiovascular: Normal rate, regular rhythm, normal heart sounds and intact distal pulses.   No murmur heard. Pulmonary/Chest: Effort normal and breath sounds normal. She has no wheezes. She has no rales.  Abdominal: Soft. Bowel sounds are normal. She exhibits no distension and no mass. There is no tenderness. There is no rebound and no guarding.  Genitourinary: Vagina normal.  Musculoskeletal: Normal range of motion. She exhibits no edema or tenderness.  Soft tissue swelling left foot, most prominent over the dorsal aspect  Neurological: She is alert and oriented to person, place, and time. She has normal reflexes. No cranial nerve deficit. She exhibits normal muscle tone. Coordination normal.  Skin: Skin is warm and dry. No rash noted.  Dry flaky skin of both feet  Psychiatric: She has a normal mood and affect. Her behavior is normal.          Assessment & Plan:   Preventive health examination Essential hypertension.  Weight loss encouraged.  Will continue present regimen.  Home blood pressure monitoring recommended Obesity.  Weight loss encouraged  Schedule colonoscopy  Tinea pedis.  OTC Lamisil  Follow-up 6-12 months  KWIATKOWSKI,PETER Homero Fellers

## 2017-03-26 NOTE — Patient Instructions (Addendum)
Limit your sodium (Salt) intake    It is important that you exercise regularly, at least 20 minutes 3 to 4 times per week.  If you develop chest pain or shortness of breath seek  medical attention.  Please check your blood pressure on a regular basis.  If it is consistently greater than 150/90, please make an office appointment.  Schedule your colonoscopy to help detect colon cancer.  Return in one year for follow-up  Athlete's Foot Athlete's foot (tinea pedis) is a fungal infection of the skin on the feet. It often occurs on the skin that is between or underneath the toes. It can also occur on the soles of the feet. The infection can spread from person to person (is contagious). What are the causes? Athlete's foot is caused by a fungus. This fungus grows in warm, moist places. Most people get athlete's foot by sharing shower stalls, towels, and wet floors with someone who is infected. Not washing your feet or changing your socks often enough can contribute to athlete's foot. What increases the risk? This condition is more likely to develop in:  Men.  People who have a weak body defense system (immune system).  People who have diabetes.  People who use public showers, such as at a gym.  People who wear heavy-duty shoes, such as [employer] workerindustrial or military shoes.  Seasons with warm, humid weather.  What are the signs or symptoms? Symptoms of this condition include:  Itchy areas between the toes or on the soles of the feet.  White, flaky, or scaly areas between the toes or on the soles of the feet.  Very itchy small blisters between the toes or on the soles of the feet.  Small cuts on the skin. These cuts can become infected.  Thick or discolored toenails.  How is this diagnosed? This condition is diagnosed with a medical history and physical exam. Your health care provider may also take a skin or toenail sample to be examined. How is this treated? Treatment for this condition  includes antifungal medicines. These may be applied as powders, ointments, or creams. In severe cases, an oral antifungal medicine may be given. Follow these instructions at home:  Apply or take over-the-counter and prescription medicines only as told by your health care provider.  Keep all follow-up visits as told by your health care provider. This is important.  Do not scratch your feet.  Keep your feet dry: ? Wear cotton or wool socks. Change your socks every day or if they become wet. ? Wear shoes that allow air to circulate, such as sandals or canvas tennis shoes.  Wash and dry your feet: ? Every day or as told by your health care provider. ? After exercising. ? Including the area between your toes.  Do not share towels, nail clippers, or other personal items that touch your feet with others.  If you have diabetes, keep your blood sugar under control. How is this prevented?  Do not share towels.  Wear sandals in wet areas, such as locker rooms and shared showers.  Keep your feet dry: ? Wear cotton or wool socks. Change your socks every day or if they become wet. ? Wear shoes that allow air to circulate, such as sandals or canvas tennis shoes.  Wash and dry your feet after exercising. Pay attention to the area between your toes. Contact a health care provider if:  You have a fever.  You have swelling, soreness, warmth, or redness in your  foot.  You are not getting better with treatment.  Your symptoms get worse.  You have new symptoms. This information is not intended to replace advice given to you by your health care provider. Make sure you discuss any questions you have with your health care provider. Document Released: 08/07/2000 Document Revised: 01/16/2016 Document Reviewed: 02/11/2015 Elsevier Interactive Patient Education  2018 [employer]

## 2017-03-30 DIAGNOSIS — J3089 Other allergic rhinitis: Secondary | ICD-10-CM | POA: Diagnosis not present

## 2017-03-30 DIAGNOSIS — J301 Allergic rhinitis due to pollen: Secondary | ICD-10-CM | POA: Diagnosis not present

## 2017-04-06 DIAGNOSIS — S93602D Unspecified sprain of left foot, subsequent encounter: Secondary | ICD-10-CM | POA: Diagnosis not present

## 2017-04-09 DIAGNOSIS — J301 Allergic rhinitis due to pollen: Secondary | ICD-10-CM | POA: Diagnosis not present

## 2017-04-09 DIAGNOSIS — J3089 Other allergic rhinitis: Secondary | ICD-10-CM | POA: Diagnosis not present

## 2017-04-20 DIAGNOSIS — J3089 Other allergic rhinitis: Secondary | ICD-10-CM | POA: Diagnosis not present

## 2017-04-20 DIAGNOSIS — J301 Allergic rhinitis due to pollen: Secondary | ICD-10-CM | POA: Diagnosis not present

## 2017-04-30 DIAGNOSIS — J3089 Other allergic rhinitis: Secondary | ICD-10-CM | POA: Diagnosis not present

## 2017-04-30 DIAGNOSIS — J301 Allergic rhinitis due to pollen: Secondary | ICD-10-CM | POA: Diagnosis not present

## 2017-05-04 DIAGNOSIS — S93602D Unspecified sprain of left foot, subsequent encounter: Secondary | ICD-10-CM | POA: Diagnosis not present

## 2017-05-10 ENCOUNTER — Encounter: Payer: Self-pay | Admitting: Gastroenterology

## 2017-05-10 DIAGNOSIS — J301 Allergic rhinitis due to pollen: Secondary | ICD-10-CM | POA: Diagnosis not present

## 2017-05-10 DIAGNOSIS — J3089 Other allergic rhinitis: Secondary | ICD-10-CM | POA: Diagnosis not present

## 2017-05-13 ENCOUNTER — Encounter: Payer: Self-pay | Admitting: Internal Medicine

## 2017-05-18 DIAGNOSIS — J3089 Other allergic rhinitis: Secondary | ICD-10-CM | POA: Diagnosis not present

## 2017-05-18 DIAGNOSIS — J301 Allergic rhinitis due to pollen: Secondary | ICD-10-CM | POA: Diagnosis not present

## 2017-05-24 ENCOUNTER — Encounter: Payer: Federal, State, Local not specified - PPO | Admitting: Gastroenterology

## 2017-05-27 DIAGNOSIS — J3089 Other allergic rhinitis: Secondary | ICD-10-CM | POA: Diagnosis not present

## 2017-05-27 DIAGNOSIS — J301 Allergic rhinitis due to pollen: Secondary | ICD-10-CM | POA: Diagnosis not present

## 2017-05-31 DIAGNOSIS — J3089 Other allergic rhinitis: Secondary | ICD-10-CM | POA: Diagnosis not present

## 2017-05-31 DIAGNOSIS — J301 Allergic rhinitis due to pollen: Secondary | ICD-10-CM | POA: Diagnosis not present

## 2017-06-15 DIAGNOSIS — J301 Allergic rhinitis due to pollen: Secondary | ICD-10-CM | POA: Diagnosis not present

## 2017-06-15 DIAGNOSIS — J3089 Other allergic rhinitis: Secondary | ICD-10-CM | POA: Diagnosis not present

## 2017-06-18 ENCOUNTER — Encounter: Payer: Self-pay | Admitting: Internal Medicine

## 2017-06-24 DIAGNOSIS — J301 Allergic rhinitis due to pollen: Secondary | ICD-10-CM | POA: Diagnosis not present

## 2017-06-24 DIAGNOSIS — J3089 Other allergic rhinitis: Secondary | ICD-10-CM | POA: Diagnosis not present

## 2017-06-28 DIAGNOSIS — J0140 Acute pansinusitis, unspecified: Secondary | ICD-10-CM | POA: Diagnosis not present

## 2017-07-05 DIAGNOSIS — J301 Allergic rhinitis due to pollen: Secondary | ICD-10-CM | POA: Diagnosis not present

## 2017-07-05 DIAGNOSIS — J3089 Other allergic rhinitis: Secondary | ICD-10-CM | POA: Diagnosis not present

## 2017-07-13 DIAGNOSIS — J301 Allergic rhinitis due to pollen: Secondary | ICD-10-CM | POA: Diagnosis not present

## 2017-07-13 DIAGNOSIS — J3089 Other allergic rhinitis: Secondary | ICD-10-CM | POA: Diagnosis not present

## 2017-07-16 DIAGNOSIS — H1089 Other conjunctivitis: Secondary | ICD-10-CM | POA: Diagnosis not present

## 2017-07-21 ENCOUNTER — Ambulatory Visit: Payer: Federal, State, Local not specified - PPO | Admitting: Gynecology

## 2017-07-21 ENCOUNTER — Encounter: Payer: Self-pay | Admitting: Gynecology

## 2017-07-21 VITALS — BP 118/78 | Ht 66.0 in | Wt 226.0 lb

## 2017-07-21 DIAGNOSIS — N841 Polyp of cervix uteri: Secondary | ICD-10-CM | POA: Diagnosis not present

## 2017-07-21 DIAGNOSIS — Z01419 Encounter for gynecological examination (general) (routine) without abnormal findings: Secondary | ICD-10-CM | POA: Diagnosis not present

## 2017-07-21 DIAGNOSIS — R87610 Atypical squamous cells of undetermined significance on cytologic smear of cervix (ASC-US): Secondary | ICD-10-CM

## 2017-07-21 DIAGNOSIS — R87612 Low grade squamous intraepithelial lesion on cytologic smear of cervix (LGSIL): Secondary | ICD-10-CM | POA: Diagnosis not present

## 2017-07-21 DIAGNOSIS — R87810 Cervical high risk human papillomavirus (HPV) DNA test positive: Secondary | ICD-10-CM

## 2017-07-21 NOTE — Addendum Note (Signed)
Addended by: Dayna BarkerGARDNER, Allannah Kempen K on: 07/21/2017 03:33 PM   Modules accepted: Orders

## 2017-07-21 NOTE — Progress Notes (Signed)
Catherine Campbell [birth year] [id number]        51 y.o.  G0P0 for annual gynecologic exam.  Doing well without gynecologic complaints.  Past medical history,surgical history, problem list, medications, allergies, family history and social history were all reviewed and documented as reviewed in the EPIC chart.  ROS:  Performed with pertinent positives and negatives included in the history, assessment and plan.   Additional significant findings :  None   Exam: [name] assistant Vitals:   07/21/17 1444  BP: 118/78  Weight: 226 lb (102.5 kg)  Height: 5\' 6"  (1.676 m)   Body mass index is 36.48 kg/m.  General appearance:  Normal affect, orientation and appearance. Skin: Grossly normal HEENT: Without gross lesions.  No cervical or supraclavicular adenopathy. Thyroid normal.  Lungs:  Clear without wheezing, rales or rhonchi Cardiac: RR, without RMG Abdominal:  Soft, nontender, without masses, guarding, rebound, organomegaly or hernia Breasts:  Examined lying and sitting without masses, retractions, discharge or axillary adenopathy. Pelvic:  Ext, BUS, Vagina: Normal  Cervix: With small endocervical polyp within the external os. Biopsied off in its entirety and sent to pathology. Pap smear/HPV done before the biopsy.  Uterus: Anteverted, normal size, shape and contour, midline and mobile nontender   Adnexa: Without masses or tenderness    Anus and perineum: Normal   Rectovaginal: Normal sphincter tone without palpated masses or tenderness.    Assessment/Plan:  51 y.o. G0P0 female for annual gynecologic exam with regular menses, abstinent contraception..   1. Endocervical polyp. Biopsied off. Sent to pathology. Patient will follow up for results. 2. History of persistent LGSIL with positive high-risk HPV. ASCUS with positive high-risk HPV 12/2016. Colposcopy was adequate normal. ECC was negative. Pap smear/HPV done today. 3. Mammography 09/2016. Continue with annual mammography when  due. SBE monthly reviewed. Breast exam normal today. 4. Health maintenance. No routine lab work done as patient does this elsewhere. Will move toward scheduling a screening colonoscopy as she has turned 51. Will follow up for biopsy results and Pap smear results. Will follow up in one year for annual exam.   [name], 3:16 PM 07/21/2017

## 2017-07-21 NOTE — Patient Instructions (Signed)
Schedule a screening colonoscopy as you have turned 51.  Office will call you with the lab see results and Pap smear results.  Follow up for annual exam in one year.

## 2017-07-22 DIAGNOSIS — J3089 Other allergic rhinitis: Secondary | ICD-10-CM | POA: Diagnosis not present

## 2017-07-22 DIAGNOSIS — J301 Allergic rhinitis due to pollen: Secondary | ICD-10-CM | POA: Diagnosis not present

## 2017-07-23 ENCOUNTER — Encounter: Payer: Self-pay | Admitting: Gynecology

## 2017-07-23 LAB — PAP IG AND HPV HIGH-RISK: HPV DNA High Risk: DETECTED — AB

## 2017-07-23 LAB — TISSUE SPECIMEN

## 2017-07-23 LAB — PATHOLOGY

## 2017-07-30 DIAGNOSIS — J301 Allergic rhinitis due to pollen: Secondary | ICD-10-CM | POA: Diagnosis not present

## 2017-07-30 DIAGNOSIS — J3089 Other allergic rhinitis: Secondary | ICD-10-CM | POA: Diagnosis not present

## 2017-08-03 DIAGNOSIS — J3089 Other allergic rhinitis: Secondary | ICD-10-CM | POA: Diagnosis not present

## 2017-08-03 DIAGNOSIS — J301 Allergic rhinitis due to pollen: Secondary | ICD-10-CM | POA: Diagnosis not present

## 2017-08-05 ENCOUNTER — Other Ambulatory Visit: Payer: Self-pay | Admitting: Internal Medicine

## 2017-08-11 DIAGNOSIS — J301 Allergic rhinitis due to pollen: Secondary | ICD-10-CM | POA: Diagnosis not present

## 2017-08-11 DIAGNOSIS — J3089 Other allergic rhinitis: Secondary | ICD-10-CM | POA: Diagnosis not present

## 2017-08-11 DIAGNOSIS — H1045 Other chronic allergic conjunctivitis: Secondary | ICD-10-CM | POA: Diagnosis not present

## 2017-08-11 DIAGNOSIS — J4540 Moderate persistent asthma, uncomplicated: Secondary | ICD-10-CM | POA: Diagnosis not present

## 2017-08-12 DIAGNOSIS — J301 Allergic rhinitis due to pollen: Secondary | ICD-10-CM | POA: Diagnosis not present

## 2017-08-13 DIAGNOSIS — J3089 Other allergic rhinitis: Secondary | ICD-10-CM | POA: Diagnosis not present

## 2017-08-27 DIAGNOSIS — J301 Allergic rhinitis due to pollen: Secondary | ICD-10-CM | POA: Diagnosis not present

## 2017-08-27 DIAGNOSIS — J3089 Other allergic rhinitis: Secondary | ICD-10-CM | POA: Diagnosis not present

## 2017-08-31 DIAGNOSIS — J301 Allergic rhinitis due to pollen: Secondary | ICD-10-CM | POA: Diagnosis not present

## 2017-08-31 DIAGNOSIS — J3089 Other allergic rhinitis: Secondary | ICD-10-CM | POA: Diagnosis not present

## 2017-09-09 DIAGNOSIS — J3089 Other allergic rhinitis: Secondary | ICD-10-CM | POA: Diagnosis not present

## 2017-09-09 DIAGNOSIS — J301 Allergic rhinitis due to pollen: Secondary | ICD-10-CM | POA: Diagnosis not present

## 2017-09-14 ENCOUNTER — Ambulatory Visit: Payer: Federal, State, Local not specified - PPO | Admitting: Family Medicine

## 2017-09-14 ENCOUNTER — Encounter: Payer: Self-pay | Admitting: Family Medicine

## 2017-09-14 VITALS — BP 122/80 | HR 100 | Temp 98.0°F | Ht 66.0 in | Wt 237.5 lb

## 2017-09-14 DIAGNOSIS — J45901 Unspecified asthma with (acute) exacerbation: Secondary | ICD-10-CM

## 2017-09-14 DIAGNOSIS — J989 Respiratory disorder, unspecified: Secondary | ICD-10-CM

## 2017-09-14 LAB — POC INFLUENZA A&B (BINAX/QUICKVUE)
Influenza A, POC: NEGATIVE
Influenza B, POC: NEGATIVE

## 2017-09-14 MED ORDER — PREDNISONE 20 MG PO TABS: 40.0000 mg | ORAL_TABLET | Freq: Every day | ORAL | 0 refills | Status: DC

## 2017-09-14 MED ORDER — BENZONATATE 100 MG PO CAPS
100.0000 mg | ORAL_CAPSULE | Freq: Three times a day (TID) | ORAL | 0 refills | Status: DC | PRN
Start: 2017-09-14 — End: 2017-12-14

## 2017-09-14 NOTE — Progress Notes (Signed)
HPI:  Acute visit for respiratory illness: -started: 2 days ago -symptoms:nasal congestion, sore throat, cough, wheezing - initially felt achy with possible fevers, required her alb yesterday - did not require alb today, but still with wheezy cough -denies:SOB, NVD, tooth pain, persistent fevers -has tried: alb -sick contacts/travel/risks: colleagues with the flu -Hx of: asthma, obesity ROS: See pertinent positives and negatives per HPI.  Past Medical History:  Diagnosis Date  . ASCUS with positive high risk human papillomavirus of vagina 06/2013, 12/2016   Colposcopic biopsy koilocytotic atypia.  . Asthma   . Hypertension   . Low grade squamous intraepithelial lesion (LGSIL) 06/2014, 06/2015, 06/2016, 06/2017   positive high-risk HPV. Colposcopic biopsy LGSIL negative ECC 2015,  ECC 2017 with koilocytotic atypia changes    Past Surgical History:  Procedure Laterality Date  . BOIL UNDER ARM      Family History  Problem Relation Age of Onset  . Diabetes Father   . Hypertension Father   . Hypertension Mother   . Cancer Mother        Pancreatic    Social History   Socioeconomic History  . Marital status: Single    Spouse name: None  . Number of children: None  . Years of education: None  . Highest education level: None  Social Needs  . Financial resource strain: None  . Food insecurity - worry: None  . Food insecurity - inability: None  . Transportation needs - medical: None  . Transportation needs - non-medical: None  Occupational History  . None  Tobacco Use  . Smoking status: Never Smoker  . Smokeless tobacco: Never Used  Substance and Sexual Activity  . Alcohol use: No    Alcohol/week: 0.0 oz  . Drug use: No  . Sexual activity: No    Comment: 1st intercourse 52 yo-Fewer than 5 partners  Other Topics Concern  . None  Social History Narrative  . None     Current Outpatient Medications:  .  ADVAIR DISKUS 250-50 MCG/DOSE AEPB, Inhale 1 puff into the  lungs 2 (two) times daily., Disp: , Rfl: 4 .  azelastine (ASTELIN) 0.1 % nasal spray, INSTILL 1 PUFF IN EACH NOSTRIL TWICE A DAY NASALLY 30 DAYS, Disp: , Rfl: 3 .  cetirizine (ZYRTEC) 10 MG tablet, Take 10 mg by mouth daily., Disp: , Rfl:  .  EPIPEN 2-PAK 0.3 MG/0.3ML SOAJ injection, Reported on 09/24/2015, Disp: , Rfl: 0 .  fexofenadine (ALLEGRA) 180 MG tablet, Take 180 mg by mouth daily.  , Disp: , Rfl:  .  fluticasone (FLONASE) 50 MCG/ACT nasal spray, INSTILL 2 SPRAYS IN EACH NOSTRIL ONCE A DAY NASALLY 30 DAYS, Disp: , Rfl: 3 .  ibuprofen (ADVIL,MOTRIN) 200 MG tablet, Take 200 mg by mouth every 6 (six) hours as needed., Disp: , Rfl:  .  losartan (COZAAR) 100 MG tablet, TAKE 1 TABLET (100 MG TOTAL) BY MOUTH DAILY., Disp: 90 tablet, Rfl: 1 .  montelukast (SINGULAIR) 10 MG tablet, Take 10 mg by mouth at bedtime. , Disp: , Rfl:  .  nystatin-triamcinolone ointment (MYCOLOG), Apply 1 application topically 2 (two) times daily., Disp: 30 g, Rfl: 1 .  UNABLE TO FIND, Allergy shots once a week, Disp: , Rfl:   EXAM:  Vitals:   09/14/17 1632  BP: 122/80  Pulse: 100  Temp: 98 F (36.7 C)  SpO2: 98%    Body mass index is 38.33 kg/m.  GENERAL: vitals reviewed and listed above, alert, oriented, appears well hydrated  and in no acute distress  HEENT: atraumatic, conjunttiva clear, no obvious abnormalities on inspection of external nose and ears, normal appearance of ear canals and TMs, clear nasal congestion, mild post oropharyngeal erythema with PND, no tonsillar edema or exudate, no sinus TTP  NECK: no obvious masses on inspection  LUNGS: clear to auscultation bilaterally, no wheezes, rales or rhonchi, good air movement  CV: HRRR, no peripheral edema  MS: moves all extremities without noticeable abnormality  PSYCH: pleasant and cooperative, no obvious depression or anxiety  ASSESSMENT AND PLAN:  Discussed the following assessment and plan:  Respiratory illness  Asthma with acute  exacerbation, unspecified asthma severity, unspecified whether persistent  -given HPI and exam findings today, a serious infection or illness is unlikely. We discussed potential etiologies, with VURI or influenza with asthma being most likely We discussed treatment side effects, likely course, antibiotic misuse, transmission, and signs of developing a serious illness.  She wanted to check a flu test and do Tamiflu if it is positive only.  She is going to try Tessalon for the cough, Rx for prednisone in case it is needed, though she feels a little better today so has opted to hold off for now. -of course, we advised to return or notify a doctor immediately if symptoms worsen or persist or new concerns arise.    There are no Patient Instructions on file for this visit.  Terressa KoyanagiHannah R Kim, DO

## 2017-09-14 NOTE — Patient Instructions (Signed)
Tessalon for cough.  Prednisone if needed.  I hope you are feeling better soon! Seek care promptly if your symptoms worsen, new concerns arise or you are not improving with treatment.

## 2017-09-16 ENCOUNTER — Encounter: Payer: Self-pay | Admitting: *Deleted

## 2017-09-16 ENCOUNTER — Telehealth: Payer: Self-pay | Admitting: *Deleted

## 2017-09-16 NOTE — Telephone Encounter (Signed)
Per Dr Selena BattenKim a note can be given stating the pt was seen on 1/22 only and if she needs a note to be out, she should see her PCP as she did not have a fever at the last visit.  I called the pt and informed her of this and offered an appt with another provider tomorrow as her PCP is out of the office for the next 2 weeks.  Patient agreed to the note stating she was here on 1/22 and this was left at the front desk for her to pick up. Veatrice KellsJ. Funderburk,CMA       Copied from CRM [phone]. Topic: General - Other >> Sep 16, 2017 12:46 PM Catherine SpareWhite, Selina wrote: Reason for CRM: Patient called to request a note for work, she was in to see Dr. Selena BattenKim on 09/14/17 and has not returned to work. She plan on going back to work on Monday, Jan 28th. Patient can be reached @ [phone] for any questions

## 2017-09-22 DIAGNOSIS — H1045 Other chronic allergic conjunctivitis: Secondary | ICD-10-CM | POA: Diagnosis not present

## 2017-09-22 DIAGNOSIS — J301 Allergic rhinitis due to pollen: Secondary | ICD-10-CM | POA: Diagnosis not present

## 2017-09-22 DIAGNOSIS — J3089 Other allergic rhinitis: Secondary | ICD-10-CM | POA: Diagnosis not present

## 2017-09-22 DIAGNOSIS — J4540 Moderate persistent asthma, uncomplicated: Secondary | ICD-10-CM | POA: Diagnosis not present

## 2017-10-04 DIAGNOSIS — H10021 Other mucopurulent conjunctivitis, right eye: Secondary | ICD-10-CM | POA: Diagnosis not present

## 2017-10-08 DIAGNOSIS — J301 Allergic rhinitis due to pollen: Secondary | ICD-10-CM | POA: Diagnosis not present

## 2017-10-11 DIAGNOSIS — J3089 Other allergic rhinitis: Secondary | ICD-10-CM | POA: Diagnosis not present

## 2017-10-11 DIAGNOSIS — J301 Allergic rhinitis due to pollen: Secondary | ICD-10-CM | POA: Diagnosis not present

## 2017-10-14 DIAGNOSIS — J301 Allergic rhinitis due to pollen: Secondary | ICD-10-CM | POA: Diagnosis not present

## 2017-10-14 DIAGNOSIS — J3089 Other allergic rhinitis: Secondary | ICD-10-CM | POA: Diagnosis not present

## 2017-10-15 DIAGNOSIS — Z1231 Encounter for screening mammogram for malignant neoplasm of breast: Secondary | ICD-10-CM | POA: Diagnosis not present

## 2017-10-19 DIAGNOSIS — J3089 Other allergic rhinitis: Secondary | ICD-10-CM | POA: Diagnosis not present

## 2017-10-19 DIAGNOSIS — J301 Allergic rhinitis due to pollen: Secondary | ICD-10-CM | POA: Diagnosis not present

## 2017-10-21 DIAGNOSIS — J3089 Other allergic rhinitis: Secondary | ICD-10-CM | POA: Diagnosis not present

## 2017-10-21 DIAGNOSIS — J301 Allergic rhinitis due to pollen: Secondary | ICD-10-CM | POA: Diagnosis not present

## 2017-10-28 DIAGNOSIS — J3089 Other allergic rhinitis: Secondary | ICD-10-CM | POA: Diagnosis not present

## 2017-10-28 DIAGNOSIS — J301 Allergic rhinitis due to pollen: Secondary | ICD-10-CM | POA: Diagnosis not present

## 2017-10-31 DIAGNOSIS — H109 Unspecified conjunctivitis: Secondary | ICD-10-CM | POA: Diagnosis not present

## 2017-11-10 DIAGNOSIS — J301 Allergic rhinitis due to pollen: Secondary | ICD-10-CM | POA: Diagnosis not present

## 2017-11-10 DIAGNOSIS — J3089 Other allergic rhinitis: Secondary | ICD-10-CM | POA: Diagnosis not present

## 2017-11-15 DIAGNOSIS — J3089 Other allergic rhinitis: Secondary | ICD-10-CM | POA: Diagnosis not present

## 2017-11-15 DIAGNOSIS — J301 Allergic rhinitis due to pollen: Secondary | ICD-10-CM | POA: Diagnosis not present

## 2017-11-23 DIAGNOSIS — J301 Allergic rhinitis due to pollen: Secondary | ICD-10-CM | POA: Diagnosis not present

## 2017-11-23 DIAGNOSIS — J3089 Other allergic rhinitis: Secondary | ICD-10-CM | POA: Diagnosis not present

## 2017-11-30 DIAGNOSIS — J3089 Other allergic rhinitis: Secondary | ICD-10-CM | POA: Diagnosis not present

## 2017-11-30 DIAGNOSIS — J301 Allergic rhinitis due to pollen: Secondary | ICD-10-CM | POA: Diagnosis not present

## 2017-12-07 DIAGNOSIS — J301 Allergic rhinitis due to pollen: Secondary | ICD-10-CM | POA: Diagnosis not present

## 2017-12-07 DIAGNOSIS — J3089 Other allergic rhinitis: Secondary | ICD-10-CM | POA: Diagnosis not present

## 2017-12-14 ENCOUNTER — Encounter: Payer: Self-pay | Admitting: *Deleted

## 2017-12-14 ENCOUNTER — Encounter: Payer: Self-pay | Admitting: Family Medicine

## 2017-12-14 ENCOUNTER — Ambulatory Visit: Payer: Federal, State, Local not specified - PPO | Admitting: Family Medicine

## 2017-12-14 VITALS — BP 130/84 | HR 86 | Temp 98.2°F | Resp 12 | Ht 66.0 in | Wt 236.4 lb

## 2017-12-14 DIAGNOSIS — J3089 Other allergic rhinitis: Secondary | ICD-10-CM | POA: Diagnosis not present

## 2017-12-14 DIAGNOSIS — M545 Low back pain, unspecified: Secondary | ICD-10-CM

## 2017-12-14 DIAGNOSIS — J301 Allergic rhinitis due to pollen: Secondary | ICD-10-CM | POA: Diagnosis not present

## 2017-12-14 MED ORDER — IBUPROFEN 600 MG PO TABS
600.0000 mg | ORAL_TABLET | Freq: Three times a day (TID) | ORAL | 0 refills | Status: AC | PRN
Start: 2017-12-14 — End: 2017-12-21

## 2017-12-14 MED ORDER — CYCLOBENZAPRINE HCL 10 MG PO TABS: 10.0000 mg | ORAL_TABLET | Freq: Three times a day (TID) | ORAL | 0 refills | Status: AC | PRN

## 2017-12-14 MED ORDER — KETOROLAC TROMETHAMINE 60 MG/2ML IM SOLN
60.0000 mg | Freq: Once | INTRAMUSCULAR | Status: AC
  Administered 2017-12-14: 60 mg via INTRAMUSCULAR

## 2017-12-14 NOTE — Progress Notes (Addendum)
ACUTE VISIT   HPI:  Chief Complaint  Patient presents with  . Back Pain    lower back pain    Catherine Campbell is a 52 y.o. female, who is here today complaining of new onset of bilateral lower back pain that started yesterday while she was in bed and has been constant since then. Sudden onset  Negative for any recent injury or unusual level of activity.  Pain is not radiated, sharp like, 9/10 in intensity, with no associated LE numbness, tingling, urinary incontinence or retention, stool incontinence, or saddle anesthesia.  Exacerbated by movement and cough. Alleviated by rest. No rash or edema on area, fever, chills, or abnormal wt loss.    OTC medications: Ibuprofen, she has not taken any today.  Problem is stable.   Review of Systems  Constitutional: Positive for activity change. Negative for appetite change, fatigue, fever and unexpected weight change.  Respiratory: Positive for cough. Negative for shortness of breath and wheezing.   Cardiovascular: Negative for leg swelling.  Gastrointestinal: Negative for abdominal pain, nausea and vomiting.       Negative for changes in bowel habits.  Genitourinary: Negative for decreased urine volume, dysuria, hematuria, vaginal bleeding and vaginal discharge.  Musculoskeletal: Positive for back pain. Negative for gait problem.  Skin: Negative for rash.  Neurological: Negative for weakness and numbness.  Hematological: Negative for adenopathy.  Psychiatric/Behavioral: Positive for sleep disturbance. Negative for confusion.      Current Outpatient Medications on File Prior to Visit  Medication Sig Dispense Refill  . azelastine (ASTELIN) 0.1 % nasal spray INSTILL 1 PUFF IN EACH NOSTRIL TWICE A DAY NASALLY 30 DAYS  3  . cetirizine (ZYRTEC) 10 MG tablet Take 10 mg by mouth daily.    Marland Kitchen. EPIPEN 2-PAK 0.3 MG/0.3ML SOAJ injection Reported on 09/24/2015  0  . fexofenadine (ALLEGRA) 180 MG tablet Take 180 mg by mouth daily.       . fluticasone (FLONASE) 50 MCG/ACT nasal spray INSTILL 2 SPRAYS IN EACH NOSTRIL ONCE A DAY NASALLY 30 DAYS  3  . fluticasone (FLOVENT HFA) 110 MCG/ACT inhaler Inhale into the lungs.    Marland Kitchen. losartan (COZAAR) 100 MG tablet TAKE 1 TABLET (100 MG TOTAL) BY MOUTH DAILY. 90 tablet 1  . montelukast (SINGULAIR) 10 MG tablet Take 10 mg by mouth at bedtime.     Marland Kitchen. UNABLE TO FIND Allergy shots once a week     No current facility-administered medications on file prior to visit.      Past Medical History:  Diagnosis Date  . ASCUS with positive high risk human papillomavirus of vagina 06/2013, 12/2016   Colposcopic biopsy koilocytotic atypia.  . Asthma   . Hypertension   . Low grade squamous intraepithelial lesion (LGSIL) 06/2014, 06/2015, 06/2016, 06/2017   positive high-risk HPV. Colposcopic biopsy LGSIL negative ECC 2015,  ECC 2017 with koilocytotic atypia changes   Allergies  Allergen Reactions  . Metaxalone Hives  . Penicillins Hives  . Tramadol-Acetaminophen Hives    Social History   Socioeconomic History  . Marital status: Single    Spouse name: Not on file  . Number of children: Not on file  . Years of education: Not on file  . Highest education level: Not on file  Occupational History  . Not on file  Social Needs  . Financial resource strain: Not on file  . Food insecurity:    Worry: Not on file    Inability: Not on file  .  Transportation needs:    Medical: Not on file    Non-medical: Not on file  Tobacco Use  . Smoking status: Never Smoker  . Smokeless tobacco: Never Used  Substance and Sexual Activity  . Alcohol use: No    Alcohol/week: 0.0 oz  . Drug use: No  . Sexual activity: Never    Comment: 1st intercourse 52 yo-Fewer than 5 partners  Lifestyle  . Physical activity:    Days per week: Not on file    Minutes per session: Not on file  . Stress: Not on file  Relationships  . Social connections:    Talks on phone: Not on file    Gets together: Not on file     Attends religious service: Not on file    Active member of club or organization: Not on file    Attends meetings of clubs or organizations: Not on file    Relationship status: Not on file  Other Topics Concern  . Not on file  Social History Narrative  . Not on file    Vitals:   12/14/17 1123  BP: 130/84  Pulse: 86  Resp: 12  Temp: 98.2 F (36.8 C)  SpO2: 99%   Body mass index is 38.15 kg/m.   Physical Exam  Nursing note and vitals reviewed. Constitutional: She is oriented to person, place, and time. She appears well-developed. She does not appear ill. She appears distressed (Mild due to pain).  HENT:  Head: Normocephalic and atraumatic.  Eyes: Pupils are equal, round, and reactive to light. Conjunctivae and EOM are normal.  Cardiovascular: Normal rate and regular rhythm.  Respiratory: Effort normal and breath sounds normal. No respiratory distress.  GI: Soft. She exhibits no mass. There is no hepatomegaly. There is no tenderness.  Musculoskeletal: She exhibits no edema.       Lumbar back: She exhibits decreased range of motion and tenderness. She exhibits no bony tenderness.       Back:  No significant deformity appreciated. There is tenderness upon palpation of paraspinal muscles, T12-L4-5,bilateral. Pain elicited with movement on exam table during examination. No local edema or erythema appreciated, no suspicious lesions.    Neurological: She is alert and oriented to person, place, and time. She has normal strength. Coordination normal.  Reflex Scores:      Patellar reflexes are 2+ on the right side and 2+ on the left side. SLR negative bilateral. She can walk on tiptoes and heels, it elicits pain. Antalgic gait.  Skin: Skin is warm. No rash noted. No erythema.  Psychiatric: She has a normal mood and affect.  Well groomed, good eye contact.    ASSESSMENT AND PLAN:   Catherine Campbell was seen today for back pain.  Diagnoses and all orders for this visit:  Acute  bilateral low back pain without sciatica -     ibuprofen (ADVIL,MOTRIN) 600 MG tablet; Take 1 tablet (600 mg total) by mouth every 8 (eight) hours as needed for up to 7 days. -     cyclobenzaprine (FLEXERIL) 10 MG tablet; Take 1 tablet (10 mg total) by mouth 3 (three) times daily as needed for up to 10 days for muscle spasms.   After verbal consent, she received Toradol 60 mg IM here in the office. She will continue ibuprofen 600 mg 3 times daily for after 7 days. Flexeril 10 mg 3 times daily as needed. Side effects of medications discussed. I do not think imaging is needed today but needs to be considered  if pain is persistent. Instructed about warning signs. She will monitor BP, given her history of hypertension. Follow-up with PCP in 2 to 3 weeks if pain is not any better, before if it gets worse. Excuse note for work given  Return if symptoms worsen or fail to improve, for with PCP.     Burdell Peed G. [place], MD  [facility]. [place] office.

## 2017-12-14 NOTE — Patient Instructions (Signed)
[name] I have seen you today for an acute visit.  A few things to remember from today's visit:   No diagnosis found.   Medications prescribed today are intended for short period of time and will not be refill upon request, a follow up appointment might be necessary to discuss continuation of of treatment if appropriate.     Back pain is very common in adults.The cause of back pain is rarely dangerous and the pain often gets better over time even with no pharmacologic treatment.  The cause of your back pain may not be known. Some common causes of back pain include: 1. Strain of the muscles or ligaments supporting the spine. 2. Wear and tear (degeneration) of the spinal disks. 3. Arthritis. 4. Direct injury to the back.  For many people, back pain may return. Since back pain is rarely dangerous, most people can learn to manage this condition on their own.  HOME CARE INSTRUCTIONS Watch your back pain for any changes. The following actions may help to lessen any discomfort you are feeling:  1. Remain active. It is stressful on your back to sit or stand in one place for long periods of time. Do not sit, drive, or stand in one place for more than 30 minutes at a time. Take short walks on even surfaces as soon as you are able.Try to increase the length of time you walk each day.  2. Exercise regularly as directed by your health care provider. Exercise helps your back heal faster. It also helps avoid future injury by keeping your muscles strong and flexible.  3. Do not stay in bed.Resting more than 1-2 days can delay your recovery.                                                      4. Pay attention to your body when you bend and lift. The most comfortable positions are those that put less stress on your recovering back.  5.  Always use proper lifting techniques, including: Bending your knees. Keeping the load close to your body. Avoiding twisting.  6. Find a  comfortable position to sleep. Use a firm mattress and lie on your side with your knees slightly bent. If you lie on your back, put a pillow under your knees.  7. Over the counter rubbing medications like Icy Hot or Asper cream with Lidocaine may help without significant side effects.  Acetaminophen and/or Aleve/Ibuprofen can be taken if needed and if not contraindications. Local ice and heat may be alternated to reduce pain and spasms. Also massage and even chiropractor treatment.      Muscle relaxants might or might not help, they cause drowsiness among other    side effects. They could also interact with some of medications you may be already taking (medications for depression/anxiety and some pain medications).   8. Maintain a healthy weight. Excess weight puts extra stress on your back and makes it difficult to maintain good posture.   SEEK MEDICAL CARE IF: worsening pain, associated fever, rash/edema on area, pain going to legs or buttocks, numbness/tingling, night pain, or abnormal weight loss.    SEEK IMMEDIATE MEDICAL CARE IF:  1. You develop new bowel or bladder control problems. 2. You have unusual weakness or numbness in your arms or legs. 3. You develop  nausea or vomiting. 4. You develop abdominal pain. 5. You feel faint.     Back Exercises The following exercises strengthen the muscles that help to support the back. They also help to keep the lower back flexible. Doing these exercises can help to prevent back pain or lessen existing pain. If you have back pain or discomfort, try doing these exercises 2-3 times each day or as told by your health care provider. When the pain goes away, do them once each day, but increase the number of times that you repeat the steps for each exercise (do more repetitions). If you do not have back pain or discomfort, do these exercises once each day or as told by your health care provider.   EXERCISES Single Knee to Chest Repeat these steps  3-5 times for each leg: 5. Lie on your back on a firm bed or the floor with your legs extended. 6. Bring one knee to your chest. Your other leg should stay extended and in contact with the floor. 7. Hold your knee in place by grabbing your knee or thigh. 8. Pull on your knee until you feel a gentle stretch in your lower back. 9. Hold the stretch for 10-30 seconds. 10. Slowly release and straighten your leg.  Pelvic Tilt Repeat these steps 5-10 times: 2. Lie on your back on a firm bed or the floor with your legs extended. 3. Bend your knees so they are pointing toward the ceiling and your feet are flat on the floor. 4. Tighten your lower abdominal muscles to press your lower back against the floor. This motion will tilt your pelvis so your tailbone points up toward the ceiling instead of pointing to your feet or the floor. 5. With gentle tension and even breathing, hold this position for 5-10 seconds.  Cat-Cow Repeat these steps until your lower back becomes more flexible: 1. Get into a hands-and-knees position on a firm surface. Keep your hands under your shoulders, and keep your knees under your hips. You may place padding under your knees for comfort. 2. Let your head hang down, and point your tailbone toward the floor so your lower back becomes rounded like the back of a cat. 3. Hold this position for 5 seconds. 4. Slowly lift your head and point your tailbone up toward the ceiling so your back forms a sagging arch like the back of a cow. 5. Hold this position for 5 seconds.   Press-Ups Repeat these steps 5-10 times: 6. Lie on your abdomen (face-down) on the floor. 7. Place your palms near your head, about shoulder-width apart. 8. While you keep your back as relaxed as possible and keep your hips on the floor, slowly straighten your arms to raise the top half of your body and lift your shoulders. Do not use your back muscles to raise your upper torso. You may adjust the placement of  your hands to make yourself more comfortable. 9. Hold this position for 5 seconds while you keep your back relaxed. 10. Slowly return to lying flat on the floor.   Bridges Repeat these steps 10 times: 1. Lie on your back on a firm surface. 2. Bend your knees so they are pointing toward the ceiling and your feet are flat on the floor. 3. Tighten your buttocks muscles and lift your buttocks off of the floor until your waist is at almost the same height as your knees. You should feel the muscles working in your buttocks and the back of  your thighs. If you do not feel these muscles, slide your feet 1-2 inches farther away from your buttocks. 4. Hold this position for 3-5 seconds. 5. Slowly lower your hips to the starting position, and allow your buttocks muscles to relax completely. If this exercise is too easy, try doing it with your arms crossed over your chest.     In general please monitor for signs of worsening symptoms and seek immediate medical attention if any concerning.  If symptoms are not resolved in 2-3 weeks you should schedule a follow up appointment with your doctor, before if needed.  I hope you get better soon!

## 2017-12-23 DIAGNOSIS — J3089 Other allergic rhinitis: Secondary | ICD-10-CM | POA: Diagnosis not present

## 2017-12-23 DIAGNOSIS — J301 Allergic rhinitis due to pollen: Secondary | ICD-10-CM | POA: Diagnosis not present

## 2017-12-29 ENCOUNTER — Encounter: Payer: Self-pay | Admitting: Gynecology

## 2017-12-29 ENCOUNTER — Ambulatory Visit: Payer: Federal, State, Local not specified - PPO | Admitting: Gynecology

## 2017-12-29 VITALS — BP 118/74

## 2017-12-29 DIAGNOSIS — R87810 Cervical high risk human papillomavirus (HPV) DNA test positive: Secondary | ICD-10-CM

## 2017-12-29 DIAGNOSIS — R87612 Low grade squamous intraepithelial lesion on cytologic smear of cervix (LGSIL): Secondary | ICD-10-CM

## 2017-12-29 NOTE — Patient Instructions (Signed)
Office will let you know the Pap smear results.

## 2017-12-29 NOTE — Addendum Note (Signed)
Addended by: Dayna Barker on: 12/29/2017 03:18 PM   Modules accepted: Orders

## 2017-12-29 NOTE — Progress Notes (Signed)
Catherine Campbell Jun 11, 1966 [id number]        52 y.o.  G0P0 presents for Pap smear.  History of persistent LGSIL with positive high risk HPV.  Colposcopy a year ago was adequate and normal with negative ECC.  Annual exam with Pap smear 06/2017 showed LGSIL with positive high risk HPV.  Presents now for repeat Pap smear.  Past medical history,surgical history, problem list, medications, allergies, family history and social history were all reviewed and documented in the EPIC chart.  Directed ROS with pertinent positives and negatives documented in the history of present illness/assessment and plan.  Exam: Catherine Campbell assistant Vitals:   12/29/17 1402  BP: 118/74   General appearance:  Normal Abdomen soft nontender without masses guarding rebound Pelvic external BUS vagina normal.  Cervix normal.  Uterus normal size midline mobile nontender.  Adnexa without masses or tenderness.  Assessment/Plan:  52 y.o. G0P0 with history of persistent LGSIL positive high risk HPV.  Colposcopy 1 year ago was adequate normal with negative ECC.  Follow-up Pap smear 6 months after that showed LGSIL with positive high risk HPV.  Pap smear done now.  We will follow-up for results.  If low-grade then plan expectant management with follow-up Pap smear at her annual exams in 6 months.  If persistent abnormal then plan colposcopy.  If higher grade now then plan colposcopy.    [name], 2:20 PM 12/29/2017

## 2017-12-30 DIAGNOSIS — J3089 Other allergic rhinitis: Secondary | ICD-10-CM | POA: Diagnosis not present

## 2017-12-30 DIAGNOSIS — J301 Allergic rhinitis due to pollen: Secondary | ICD-10-CM | POA: Diagnosis not present

## 2018-01-05 LAB — PAP IG W/ RFLX HPV ASCU

## 2018-01-06 ENCOUNTER — Encounter: Payer: Self-pay | Admitting: Gynecology

## 2018-01-06 DIAGNOSIS — J301 Allergic rhinitis due to pollen: Secondary | ICD-10-CM | POA: Diagnosis not present

## 2018-01-06 DIAGNOSIS — J3089 Other allergic rhinitis: Secondary | ICD-10-CM | POA: Diagnosis not present

## 2018-01-12 DIAGNOSIS — J301 Allergic rhinitis due to pollen: Secondary | ICD-10-CM | POA: Diagnosis not present

## 2018-01-12 DIAGNOSIS — J3089 Other allergic rhinitis: Secondary | ICD-10-CM | POA: Diagnosis not present

## 2018-01-19 DIAGNOSIS — J301 Allergic rhinitis due to pollen: Secondary | ICD-10-CM | POA: Diagnosis not present

## 2018-01-19 DIAGNOSIS — J3089 Other allergic rhinitis: Secondary | ICD-10-CM | POA: Diagnosis not present

## 2018-01-24 DIAGNOSIS — H10413 Chronic giant papillary conjunctivitis, bilateral: Secondary | ICD-10-CM | POA: Diagnosis not present

## 2018-01-24 DIAGNOSIS — H04123 Dry eye syndrome of bilateral lacrimal glands: Secondary | ICD-10-CM | POA: Diagnosis not present

## 2018-01-25 DIAGNOSIS — J301 Allergic rhinitis due to pollen: Secondary | ICD-10-CM | POA: Diagnosis not present

## 2018-01-25 DIAGNOSIS — J3089 Other allergic rhinitis: Secondary | ICD-10-CM | POA: Diagnosis not present

## 2018-01-27 ENCOUNTER — Other Ambulatory Visit: Payer: Self-pay | Admitting: Internal Medicine

## 2018-02-02 DIAGNOSIS — J3089 Other allergic rhinitis: Secondary | ICD-10-CM | POA: Diagnosis not present

## 2018-02-02 DIAGNOSIS — J301 Allergic rhinitis due to pollen: Secondary | ICD-10-CM | POA: Diagnosis not present

## 2018-02-03 DIAGNOSIS — J301 Allergic rhinitis due to pollen: Secondary | ICD-10-CM | POA: Diagnosis not present

## 2018-02-03 DIAGNOSIS — J3089 Other allergic rhinitis: Secondary | ICD-10-CM | POA: Diagnosis not present

## 2018-02-11 DIAGNOSIS — J301 Allergic rhinitis due to pollen: Secondary | ICD-10-CM | POA: Diagnosis not present

## 2018-02-11 DIAGNOSIS — J3089 Other allergic rhinitis: Secondary | ICD-10-CM | POA: Diagnosis not present

## 2018-02-22 ENCOUNTER — Encounter: Payer: Self-pay | Admitting: Internal Medicine

## 2018-02-22 ENCOUNTER — Ambulatory Visit: Payer: Federal, State, Local not specified - PPO | Admitting: Internal Medicine

## 2018-02-22 VITALS — BP 100/60 | HR 95 | Temp 98.4°F | Wt 230.0 lb

## 2018-02-22 DIAGNOSIS — B9789 Other viral agents as the cause of diseases classified elsewhere: Secondary | ICD-10-CM

## 2018-02-22 DIAGNOSIS — J069 Acute upper respiratory infection, unspecified: Secondary | ICD-10-CM | POA: Diagnosis not present

## 2018-02-22 DIAGNOSIS — I10 Essential (primary) hypertension: Secondary | ICD-10-CM

## 2018-02-22 MED ORDER — HYDROCODONE-HOMATROPINE 5-1.5 MG/5ML PO SYRP: 5.0000 mL | ORAL_SOLUTION | Freq: Four times a day (QID) | ORAL | 0 refills | Status: AC | PRN

## 2018-02-22 NOTE — Progress Notes (Signed)
Subjective:    Patient ID: Catherine Campbell, female    DOB: Dec 28, 1965, 52 y.o.   MRN: [id number]  HPI  52 year old patient who presents with a 4-day history of sinus and chest congestion.  She describes rhinorrhea sinus pressure and cough.  She has used [employer] and Singulair without much benefit.  She has been using OTC antitussives with some benefit. Her mother died recently of pancreatic cancer and she had the onset of symptoms shortly after her mother's service 4 days ago  Past Medical History:  Diagnosis Date  . ASCUS with positive high risk human papillomavirus of vagina 06/2013, 12/2016   Colposcopic biopsy koilocytotic atypia.  . Asthma   . Hypertension   . Low grade squamous intraepithelial lesion (LGSIL) 06/2014, 06/2015, 06/2016, 06/2017, 12/2017   positive high-risk HPV. Colposcopic biopsy LGSIL negative ECC 2015,  ECC 2017 with koilocytotic atypia changes     Social History   Socioeconomic History  . Marital status: Single    Spouse name: Not on file  . Number of children: Not on file  . Years of education: Not on file  . Highest education level: Not on file  Occupational History  . Not on file  Social Needs  . Financial resource strain: Not on file  . Food insecurity:    Worry: Not on file    Inability: Not on file  . Transportation needs:    Medical: Not on file    Non-medical: Not on file  Tobacco Use  . Smoking status: Never Smoker  . Smokeless tobacco: Never Used  Substance and Sexual Activity  . Alcohol use: No    Alcohol/week: 0.0 oz  . Drug use: No  . Sexual activity: Never    Comment: 1st intercourse 52 yo-Fewer than 5 partners  Lifestyle  . Physical activity:    Days per week: Not on file    Minutes per session: Not on file  . Stress: Not on file  Relationships  . Social connections:    Talks on phone: Not on file    Gets together: Not on file    Attends religious service: Not on file    Active member of club or organization: Not on  file    Attends meetings of clubs or organizations: Not on file    Relationship status: Not on file  . Intimate partner violence:    Fear of current or ex partner: Not on file    Emotionally abused: Not on file    Physically abused: Not on file    Forced sexual activity: Not on file  Other Topics Concern  . Not on file  Social History Narrative  . Not on file    Past Surgical History:  Procedure Laterality Date  . BOIL UNDER ARM      Family History  Problem Relation Age of Onset  . Diabetes Father   . Hypertension Father   . Hypertension Mother   . Cancer Mother        Pancreatic    Allergies  Allergen Reactions  . Metaxalone Hives  . Penicillins Hives  . Tramadol-Acetaminophen Hives    Current Outpatient Medications on File Prior to Visit  Medication Sig Dispense Refill  . azelastine (ASTELIN) 0.1 % nasal spray INSTILL 1 PUFF IN EACH NOSTRIL TWICE A DAY NASALLY 30 DAYS  3  . cetirizine (ZYRTEC) 10 MG tablet Take 10 mg by mouth daily.    Marland Kitchen EPIPEN 2-PAK 0.3 MG/0.3ML SOAJ injection Reported on  09/24/2015  0  . fexofenadine (ALLEGRA) 180 MG tablet Take 180 mg by mouth daily.      . fluticasone (FLONASE) 50 MCG/ACT nasal spray INSTILL 2 SPRAYS IN EACH NOSTRIL ONCE A DAY NASALLY 30 DAYS  3  . losartan (COZAAR) 100 MG tablet TAKE 1 TABLET (100 MG TOTAL) BY MOUTH DAILY. 90 tablet 1  . montelukast (SINGULAIR) 10 MG tablet Take 10 mg by mouth at bedtime.     Marland Kitchen UNABLE TO FIND Allergy shots once a week     No current facility-administered medications on file prior to visit.     BP 100/60 (BP Location: Right Arm, Patient Position: Sitting, Cuff Size: Large)   Pulse 95   Temp 98.4 F (36.9 C) (Oral)   Wt 230 lb (104.3 kg)   SpO2 99%   BMI 37.12 kg/m     Review of Systems  Constitutional: Positive for activity change, appetite change and fatigue.  HENT: Positive for congestion, rhinorrhea and sinus pressure. Negative for dental problem, hearing loss, sore throat and  tinnitus.   Eyes: Negative for pain, discharge and visual disturbance.  Respiratory: Positive for cough. Negative for shortness of breath.   Cardiovascular: Negative for chest pain, palpitations and leg swelling.  Gastrointestinal: Negative for abdominal distention, abdominal pain, blood in stool, constipation, diarrhea, nausea and vomiting.  Genitourinary: Negative for difficulty urinating, dysuria, flank pain, frequency, hematuria, pelvic pain, urgency, vaginal bleeding, vaginal discharge and vaginal pain.  Musculoskeletal: Negative for arthralgias, gait problem and joint swelling.  Skin: Negative for rash.  Neurological: Negative for dizziness, syncope, speech difficulty, weakness, numbness and headaches.  Hematological: Negative for adenopathy.  Psychiatric/Behavioral: Positive for dysphoric mood and sleep disturbance. Negative for agitation and behavioral problems. The patient is nervous/anxious.        Objective:   Physical Exam  Constitutional: She is oriented to person, place, and time. She appears well-developed and well-nourished. No distress.  No acute distress but appears unwell.  Afebrile  HENT:  Head: Normocephalic.  Right Ear: External ear normal.  Left Ear: External ear normal.  Mouth/Throat: Oropharynx is clear and moist.  Nasal congestion  Eyes: Pupils are equal, round, and reactive to light. Conjunctivae and EOM are normal.  Neck: Normal range of motion. Neck supple. No thyromegaly present.  Cardiovascular: Normal rate, regular rhythm, normal heart sounds and intact distal pulses.  Pulmonary/Chest: Effort normal and breath sounds normal.  Abdominal: Soft. Bowel sounds are normal. She exhibits no mass. There is no tenderness.  Musculoskeletal: Normal range of motion.  Lymphadenopathy:    She has no cervical adenopathy.  Neurological: She is alert and oriented to person, place, and time.  Skin: Skin is warm and dry. No rash noted.  Psychiatric: She has a normal mood  and affect. Her behavior is normal.          Assessment & Plan:   Viral URI with cough.  Will treat symptomatically Grief reaction.  Patient was given a letter suggesting a 2-week medical leave of absence.  Grief counseling encouraged  Essential hypertension stable  Gordy Savers

## 2018-02-22 NOTE — Patient Instructions (Addendum)
Acute bronchitis symptoms for less than 10 days are generally not helped by antibiotics.  Take over-the-counter expectorants and cough medications such as  Mucinex DM.  Call if there is no improvement in 5 to 7 days or if  you develop worsening cough, fever, or new symptoms, such as shortness of breath or chest pain.  Hydrate and Humidify  Drink enough water to keep your urine clear or pale yellow. Staying hydrated will help to thin your mucus.  Use a cool mist humidifier to keep the humidity level in your home above 50%.  Inhale steam for 10-15 minutes, 3-4 times a day or as told by your health care provider. You can do this in the bathroom while a hot shower is running.  

## 2018-02-23 DIAGNOSIS — J301 Allergic rhinitis due to pollen: Secondary | ICD-10-CM | POA: Diagnosis not present

## 2018-02-23 DIAGNOSIS — J3089 Other allergic rhinitis: Secondary | ICD-10-CM | POA: Diagnosis not present

## 2018-02-25 DIAGNOSIS — H16223 Keratoconjunctivitis sicca, not specified as Sjogren's, bilateral: Secondary | ICD-10-CM | POA: Diagnosis not present

## 2018-02-25 DIAGNOSIS — H10413 Chronic giant papillary conjunctivitis, bilateral: Secondary | ICD-10-CM | POA: Diagnosis not present

## 2018-02-28 ENCOUNTER — Telehealth: Payer: Self-pay | Admitting: *Deleted

## 2018-02-28 NOTE — Telephone Encounter (Signed)
Copied from CRM [id number]. Topic: General - Other >> Feb 28, 2018 10:07 AM Luanna Coleawoud, Jessica L wrote: Reason for CRM: pt states that she has a note that takes her out of work and she states that the note states out of work until 7/15 but she need it to say 7/22. She needs to return on the 22nd instead of the 15th. Please advise [phone]#[phone]

## 2018-02-28 NOTE — Telephone Encounter (Signed)
Please advise 

## 2018-02-28 NOTE — Telephone Encounter (Signed)
Okay to extend medical leave of absence

## 2018-03-01 DIAGNOSIS — J3089 Other allergic rhinitis: Secondary | ICD-10-CM | POA: Diagnosis not present

## 2018-03-01 DIAGNOSIS — J301 Allergic rhinitis due to pollen: Secondary | ICD-10-CM | POA: Diagnosis not present

## 2018-03-01 NOTE — Telephone Encounter (Signed)
Extended days approved per Dr.Kwiatkowski. Pt notified that its ready for pickup. Letter placed in filling cabinet.

## 2018-03-09 DIAGNOSIS — H1045 Other chronic allergic conjunctivitis: Secondary | ICD-10-CM | POA: Diagnosis not present

## 2018-03-09 DIAGNOSIS — J4540 Moderate persistent asthma, uncomplicated: Secondary | ICD-10-CM | POA: Diagnosis not present

## 2018-03-09 DIAGNOSIS — J3089 Other allergic rhinitis: Secondary | ICD-10-CM | POA: Diagnosis not present

## 2018-03-09 DIAGNOSIS — J301 Allergic rhinitis due to pollen: Secondary | ICD-10-CM | POA: Diagnosis not present

## 2018-03-15 ENCOUNTER — Telehealth: Payer: Self-pay | Admitting: Internal Medicine

## 2018-03-15 DIAGNOSIS — J3089 Other allergic rhinitis: Secondary | ICD-10-CM | POA: Diagnosis not present

## 2018-03-15 DIAGNOSIS — J301 Allergic rhinitis due to pollen: Secondary | ICD-10-CM | POA: Diagnosis not present

## 2018-03-15 NOTE — Telephone Encounter (Signed)
Patient dropped off FMLA forms  Call patient to pick up at: [phone]  Disposition: Dr's Folder

## 2018-03-16 NOTE — Telephone Encounter (Signed)
Form completed, Patient has been notified and will pick up

## 2018-03-18 DIAGNOSIS — J301 Allergic rhinitis due to pollen: Secondary | ICD-10-CM | POA: Diagnosis not present

## 2018-03-18 DIAGNOSIS — J3089 Other allergic rhinitis: Secondary | ICD-10-CM | POA: Diagnosis not present

## 2018-03-18 NOTE — Telephone Encounter (Signed)
Pt picked FMLA form up and there wasn't a charge to complete it.

## 2018-03-21 DIAGNOSIS — J301 Allergic rhinitis due to pollen: Secondary | ICD-10-CM | POA: Diagnosis not present

## 2018-03-21 DIAGNOSIS — J3089 Other allergic rhinitis: Secondary | ICD-10-CM | POA: Diagnosis not present

## 2018-03-25 DIAGNOSIS — J3089 Other allergic rhinitis: Secondary | ICD-10-CM | POA: Diagnosis not present

## 2018-03-25 DIAGNOSIS — J301 Allergic rhinitis due to pollen: Secondary | ICD-10-CM | POA: Diagnosis not present

## 2018-03-28 ENCOUNTER — Telehealth: Payer: Self-pay | Admitting: Family Medicine

## 2018-03-28 NOTE — Telephone Encounter (Signed)
The patient walked in to give more information on what she needs in the work note to be faxed to her employer today.  She needs the note to have the dates out of work:  02/21/2018 through 03/25/2018  With no restrictions  She said that you can put in the note that she was out due to the death of her mother and stress.  She needs the number faxed to her employer today so she can go back to work tomorrow.   Fax: [phone] ATTN: Lady GaryAndrea Starks

## 2018-03-28 NOTE — Telephone Encounter (Signed)
Copied from CRM [id number]. Topic: General - Other >> Mar 28, 2018  1:49 PM Mcneil, Ja-Kwan wrote: Reason for CRM: Pt requesting that a letter be faxed to her employer stating that it is ok for her to return back to work with no restrictions. Fax# [phone]  Cb# [phone]

## 2018-03-29 ENCOUNTER — Telehealth: Payer: Self-pay | Admitting: Family Medicine

## 2018-03-29 DIAGNOSIS — J301 Allergic rhinitis due to pollen: Secondary | ICD-10-CM | POA: Diagnosis not present

## 2018-03-29 DIAGNOSIS — J3089 Other allergic rhinitis: Secondary | ICD-10-CM | POA: Diagnosis not present

## 2018-03-29 NOTE — Telephone Encounter (Signed)
Letter placed in filing cabinet!

## 2018-03-29 NOTE — Telephone Encounter (Signed)
Copied from CRM [phone]. Topic: General - Other >> Mar 28, 2018  1:49 PM Mcneil, Ja-Kwan wrote: Reason for CRM: Pt requesting that a letter be faxed to her employer stating that it is ok for her to return back to work with no restrictions. Fax# [phone]  Cb# [phone] >> Mar 29, 2018  8:27 AM Gloriann LoanPayne, Angela L wrote: Pt calling stating that she need her letter that she stopped by and asked for yesterday sent to her job she cant return to her job without a note please give her a call back at [phone]

## 2018-03-29 NOTE — Telephone Encounter (Signed)
Left detailed message informing pt that letter is ready for pick up.

## 2018-03-31 ENCOUNTER — Encounter: Payer: Self-pay | Admitting: Internal Medicine

## 2018-03-31 ENCOUNTER — Ambulatory Visit (INDEPENDENT_AMBULATORY_CARE_PROVIDER_SITE_OTHER): Payer: Federal, State, Local not specified - PPO | Admitting: Internal Medicine

## 2018-03-31 VITALS — BP 120/70 | HR 88 | Temp 98.3°F | Ht 65.75 in | Wt 231.8 lb

## 2018-03-31 DIAGNOSIS — Z0000 Encounter for general adult medical examination without abnormal findings: Secondary | ICD-10-CM

## 2018-03-31 DIAGNOSIS — J302 Other seasonal allergic rhinitis: Secondary | ICD-10-CM | POA: Insufficient documentation

## 2018-03-31 LAB — LIPID PANEL
CHOL/HDL RATIO: 4
Cholesterol: 213 mg/dL — ABNORMAL HIGH (ref 0–200)
HDL: 54 mg/dL (ref >39.00)
LDL CALC: 143 mg/dL — AB (ref 0–99)
NONHDL: 159.17
Triglycerides: 82 mg/dL (ref 0.0–149.0)
VLDL: 16.4 mg/dL (ref 0.0–40.0)

## 2018-03-31 LAB — TSH: TSH: 1.52 u[IU]/mL (ref 0.35–4.50)

## 2018-03-31 LAB — COMPREHENSIVE METABOLIC PANEL
ALK PHOS: 76 U/L (ref 39–117)
ALT: 9 U/L (ref 0–35)
AST: 12 U/L (ref 0–37)
Albumin: 3.9 g/dL (ref 3.5–5.2)
BUN: 11 mg/dL (ref 6–23)
CHLORIDE: 106 meq/L (ref 96–112)
CO2: 29 meq/L (ref 19–32)
Calcium: 9.3 mg/dL (ref 8.4–10.5)
Creatinine, Ser: 0.93 mg/dL (ref 0.40–1.20)
GFR: 81.73 mL/min (ref >60.00)
GLUCOSE: 90 mg/dL (ref 70–99)
POTASSIUM: 4 meq/L (ref 3.5–5.1)
Sodium: 142 mEq/L (ref 135–145)
Total Bilirubin: 0.4 mg/dL (ref 0.2–1.2)
Total Protein: 7.3 g/dL (ref 6.0–8.3)

## 2018-03-31 LAB — CBC WITH DIFFERENTIAL/PLATELET
BASOS PCT: 0.2 % (ref 0.0–3.0)
Basophils Absolute: 0 10*3/uL (ref 0.0–0.1)
EOS PCT: 2.2 % (ref 0.0–5.0)
Eosinophils Absolute: 0.1 10*3/uL (ref 0.0–0.7)
HCT: 33.3 % — ABNORMAL LOW (ref 36.0–46.0)
Hemoglobin: 11.4 g/dL — ABNORMAL LOW (ref 12.0–15.0)
LYMPHS ABS: 1.4 10*3/uL (ref 0.7–4.0)
Lymphocytes Relative: 28.6 % (ref 12.0–46.0)
MCHC: 34.3 g/dL (ref 30.0–36.0)
MCV: 86.5 fl (ref 78.0–100.0)
MONOS PCT: 8.4 % (ref 3.0–12.0)
Monocytes Absolute: 0.4 10*3/uL (ref 0.1–1.0)
NEUTROS ABS: 3 10*3/uL (ref 1.4–7.7)
NEUTROS PCT: 60.6 % (ref 43.0–77.0)
PLATELETS: 282 10*3/uL (ref 150.0–400.0)
RBC: 3.85 Mil/uL — ABNORMAL LOW (ref 3.87–5.11)
RDW: 14.4 % (ref 11.5–15.5)
WBC: 4.9 10*3/uL (ref 4.0–10.5)

## 2018-03-31 NOTE — Patient Instructions (Signed)
Limit your sodium (Salt) intake  Please check your blood pressure on a regular basis.  If it is consistently greater than 150/90, please make an office appointment.  You need to lose weight.  Consider a lower calorie diet and regular exercise.    It is important that you exercise regularly, at least 20 minutes 3 to 4 times per week.  If you develop chest pain or shortness of breath seek  medical attention.  Schedule your colonoscopy to help detect colon cancer.  .Marland Kitchen

## 2018-03-31 NOTE — Progress Notes (Signed)
Subjective:    Patient ID: Catherine Campbell, female    DOB: 1965-11-11, 52 y.o.   MRN: [id number]  HPI  52 year old patient who is seen today for a preventive health examination.  She is followed annually by gynecology and does have annual mammograms. No screening colonoscopies.  Last years was delayed due to the poor health of her mother who now is deceased from complications of pancreatic cancer She has a history of essential hypertension mild dyslipidemia and allergic rhinitis She is followed by ophthalmology for dry eyes  Past Medical History:  Diagnosis Date  . ASCUS with positive high risk human papillomavirus of vagina 06/2013, 12/2016   Colposcopic biopsy koilocytotic atypia.  . Asthma   . Hypertension   . Low grade squamous intraepithelial lesion (LGSIL) 06/2014, 06/2015, 06/2016, 06/2017, 12/2017   positive high-risk HPV. Colposcopic biopsy LGSIL negative ECC 2015,  ECC 2017 with koilocytotic atypia changes     Social History   Socioeconomic History  . Marital status: Single    Spouse name: Not on file  . Number of children: Not on file  . Years of education: Not on file  . Highest education level: Not on file  Occupational History  . Not on file  Social Needs  . Financial resource strain: Not on file  . Food insecurity:    Worry: Not on file    Inability: Not on file  . Transportation needs:    Medical: Not on file    Non-medical: Not on file  Tobacco Use  . Smoking status: Never Smoker  . Smokeless tobacco: Never Used  Substance and Sexual Activity  . Alcohol use: No    Alcohol/week: 0.0 standard drinks  . Drug use: No  . Sexual activity: Never    Comment: 1st intercourse 52 yo-Fewer than 5 partners  Lifestyle  . Physical activity:    Days per week: Not on file    Minutes per session: Not on file  . Stress: Not on file  Relationships  . Social connections:    Talks on phone: Not on file    Gets together: Not on file    Attends religious service: Not  on file    Active member of club or organization: Not on file    Attends meetings of clubs or organizations: Not on file    Relationship status: Not on file  . Intimate partner violence:    Fear of current or ex partner: Not on file    Emotionally abused: Not on file    Physically abused: Not on file    Forced sexual activity: Not on file  Other Topics Concern  . Not on file  Social History Narrative  . Not on file    Past Surgical History:  Procedure Laterality Date  . BOIL UNDER ARM      Family History  Problem Relation Age of Onset  . Diabetes Father   . Hypertension Father   . Hypertension Mother   . Cancer Mother        Pancreatic    Allergies  Allergen Reactions  . Metaxalone Hives  . Penicillins Hives  . Tramadol-Acetaminophen Hives    Current Outpatient Medications on File Prior to Visit  Medication Sig Dispense Refill  . albuterol (PROAIR HFA) 108 (90 Base) MCG/ACT inhaler     . azelastine (ASTELIN) 0.1 % nasal spray INSTILL 1 PUFF IN EACH NOSTRIL TWICE A DAY NASALLY 30 DAYS  3  . cetirizine (ZYRTEC) 10 MG tablet  Take 10 mg by mouth daily.    Marland Kitchen EPIPEN 2-PAK 0.3 MG/0.3ML SOAJ injection Reported on 09/24/2015  0  . fexofenadine (ALLEGRA) 180 MG tablet Take 180 mg by mouth daily.      . fluticasone (FLONASE) 50 MCG/ACT nasal spray INSTILL 2 SPRAYS IN EACH NOSTRIL ONCE A DAY NASALLY 30 DAYS  3  . Fluticasone-Salmeterol (ADVAIR DISKUS) 250-50 MCG/DOSE AEPB INHALE 1 PUFF TWICE A DAY    . Influenza vac split quadrivalent PF (AFLURIA QUADRIVALENT) 0.5 ML injection ADM 0.5ML IM UTD    . losartan (COZAAR) 100 MG tablet TAKE 1 TABLET (100 MG TOTAL) BY MOUTH DAILY. 90 tablet 1  . montelukast (SINGULAIR) 10 MG tablet Take 10 mg by mouth at bedtime.     Marland Kitchen UNABLE TO FIND Allergy shots once a week    . fluorometholone (FML) 0.1 % ophthalmic suspension SHAKE LQ AND INT 1 GTT IN OU BID  1  . fluticasone (FLOVENT HFA) 110 MCG/ACT inhaler Inhale into the lungs.    Marland Kitchen ibuprofen  (ADVIL,MOTRIN) 200 MG tablet Take by mouth.    . RESTASIS 0.05 % ophthalmic emulsion INSTILL 1 DROP INTO BOTH EYES TWICE A DAY  3  . SYMBICORT 80-4.5 MCG/ACT inhaler      No current facility-administered medications on file prior to visit.     BP 120/70 (BP Location: Right Arm, Patient Position: Sitting, Cuff Size: Large)   Pulse 88   Temp 98.3 F (36.8 C) (Oral)   Ht 5' 5.75" (1.67 m)   Wt 231 lb 12.8 oz (105.1 kg)   LMP 03/28/2018 (Exact Date)   SpO2 98%   BMI 37.70 kg/m     Review of Systems  Constitutional: Negative.   HENT: Negative for congestion, dental problem, hearing loss, rhinorrhea, sinus pressure, sore throat and tinnitus.   Eyes: Negative for pain, discharge and visual disturbance.       Dry eyes  Respiratory: Negative for cough and shortness of breath.   Cardiovascular: Negative for chest pain, palpitations and leg swelling.  Gastrointestinal: Negative for abdominal distention, abdominal pain, blood in stool, constipation, diarrhea, nausea and vomiting.  Genitourinary: Negative for difficulty urinating, dysuria, flank pain, frequency, hematuria, pelvic pain, urgency, vaginal bleeding, vaginal discharge and vaginal pain.  Musculoskeletal: Negative for arthralgias, gait problem and joint swelling.  Skin: Negative for rash.  Neurological: Negative for dizziness, syncope, speech difficulty, weakness, numbness and headaches.  Hematological: Negative for adenopathy.  Psychiatric/Behavioral: Negative for agitation, behavioral problems and dysphoric mood. The patient is not nervous/anxious.        Objective:   Physical Exam  Constitutional: She is oriented to person, place, and time. She appears well-developed and well-nourished.  Weight 232 Blood pressure well controlled  HENT:  Head: Normocephalic and atraumatic.  Right Ear: External ear normal.  Left Ear: External ear normal.  Mouth/Throat: Oropharynx is clear and moist.  Eyes: Conjunctivae and EOM are normal.   Neck: Normal range of motion. Neck supple. No JVD present. No thyromegaly present.  Cardiovascular: Normal rate, regular rhythm, normal heart sounds and intact distal pulses.  No murmur heard. Pulmonary/Chest: Effort normal and breath sounds normal. She has no wheezes. She has no rales.  Abdominal: Soft. Bowel sounds are normal. She exhibits no distension and no mass. There is no tenderness. There is no rebound and no guarding.  Genitourinary: Vagina normal.  Musculoskeletal: Normal range of motion. She exhibits no edema or tenderness.  Neurological: She is alert and oriented to person, place, and  time. She has normal reflexes. She displays normal reflexes. No cranial nerve deficit. She exhibits normal muscle tone. Coordination normal.  Skin: Skin is warm and dry. No rash noted.  Psychiatric: She has a normal mood and affect. Her behavior is normal.          Assessment & Plan:   Preventive health examination Essential hypertension well-controlled Allergic rhinitis  Medications updated We will review updated lab Reschedule colonoscopy  Follow-up with new provider in 6 months  Gordy Savers

## 2018-04-07 DIAGNOSIS — J301 Allergic rhinitis due to pollen: Secondary | ICD-10-CM | POA: Diagnosis not present

## 2018-04-07 DIAGNOSIS — J3089 Other allergic rhinitis: Secondary | ICD-10-CM | POA: Diagnosis not present

## 2018-04-11 DIAGNOSIS — H10413 Chronic giant papillary conjunctivitis, bilateral: Secondary | ICD-10-CM | POA: Diagnosis not present

## 2018-04-11 DIAGNOSIS — H16223 Keratoconjunctivitis sicca, not specified as Sjogren's, bilateral: Secondary | ICD-10-CM | POA: Diagnosis not present

## 2018-04-12 DIAGNOSIS — J3089 Other allergic rhinitis: Secondary | ICD-10-CM | POA: Diagnosis not present

## 2018-04-12 DIAGNOSIS — J301 Allergic rhinitis due to pollen: Secondary | ICD-10-CM | POA: Diagnosis not present

## 2018-04-19 DIAGNOSIS — J301 Allergic rhinitis due to pollen: Secondary | ICD-10-CM | POA: Diagnosis not present

## 2018-04-19 DIAGNOSIS — J3089 Other allergic rhinitis: Secondary | ICD-10-CM | POA: Diagnosis not present

## 2018-04-28 DIAGNOSIS — J3081 Allergic rhinitis due to animal (cat) (dog) hair and dander: Secondary | ICD-10-CM | POA: Diagnosis not present

## 2018-04-28 DIAGNOSIS — J3089 Other allergic rhinitis: Secondary | ICD-10-CM | POA: Diagnosis not present

## 2018-04-28 DIAGNOSIS — J301 Allergic rhinitis due to pollen: Secondary | ICD-10-CM | POA: Diagnosis not present

## 2018-05-02 ENCOUNTER — Encounter: Payer: Self-pay | Admitting: Gastroenterology

## 2018-05-05 DIAGNOSIS — J301 Allergic rhinitis due to pollen: Secondary | ICD-10-CM | POA: Diagnosis not present

## 2018-05-05 DIAGNOSIS — J3089 Other allergic rhinitis: Secondary | ICD-10-CM | POA: Diagnosis not present

## 2018-05-09 ENCOUNTER — Ambulatory Visit (AMBULATORY_SURGERY_CENTER): Payer: Self-pay

## 2018-05-09 ENCOUNTER — Other Ambulatory Visit: Payer: Self-pay | Admitting: Gastroenterology

## 2018-05-09 ENCOUNTER — Telehealth: Payer: Self-pay | Admitting: Gastroenterology

## 2018-05-09 VITALS — Ht 65.0 in | Wt 236.0 lb

## 2018-05-09 DIAGNOSIS — Z8371 Family history of colonic polyps: Secondary | ICD-10-CM

## 2018-05-09 DIAGNOSIS — Z1211 Encounter for screening for malignant neoplasm of colon: Secondary | ICD-10-CM

## 2018-05-09 DIAGNOSIS — Z83719 Family history of colon polyps, unspecified: Secondary | ICD-10-CM

## 2018-05-09 MED ORDER — PLENVU 140 G PO SOLR: 1.0000 | ORAL | 0 refills | Status: DC

## 2018-05-09 NOTE — Progress Notes (Signed)
No egg or soy allergy known to patient  No issues with past sedation with any surgeries  or procedures, no intubation problems  No diet pills per patient No home 02 use per patient  No blood thinners per patient  Pt denies issues with constipation  No A fib or A flutter  EMMI video sent to pt's e mail  Pt. declined 

## 2018-05-09 NOTE — Telephone Encounter (Signed)
[facility] on [address] was called and a pay no more than 50.00 coupon was given to the pharmacy. Patient was called and informed that her prep would be 50.00. Pharmacy states that they don't have Plenvu in stock but they could get it by tomorrow. Patient was states she will pick it up tomorrow or when the pharmacy calls her.   Catherine DaneNancy Yvon Mccord, LPN ( [facility] )

## 2018-05-11 DIAGNOSIS — J301 Allergic rhinitis due to pollen: Secondary | ICD-10-CM | POA: Diagnosis not present

## 2018-05-12 ENCOUNTER — Encounter: Payer: Self-pay | Admitting: Gastroenterology

## 2018-05-12 DIAGNOSIS — J3089 Other allergic rhinitis: Secondary | ICD-10-CM | POA: Diagnosis not present

## 2018-05-12 DIAGNOSIS — J301 Allergic rhinitis due to pollen: Secondary | ICD-10-CM | POA: Diagnosis not present

## 2018-05-17 DIAGNOSIS — J3089 Other allergic rhinitis: Secondary | ICD-10-CM | POA: Diagnosis not present

## 2018-05-17 DIAGNOSIS — J301 Allergic rhinitis due to pollen: Secondary | ICD-10-CM | POA: Diagnosis not present

## 2018-05-25 DIAGNOSIS — J301 Allergic rhinitis due to pollen: Secondary | ICD-10-CM | POA: Diagnosis not present

## 2018-05-25 DIAGNOSIS — H16223 Keratoconjunctivitis sicca, not specified as Sjogren's, bilateral: Secondary | ICD-10-CM | POA: Diagnosis not present

## 2018-05-25 DIAGNOSIS — J3089 Other allergic rhinitis: Secondary | ICD-10-CM | POA: Diagnosis not present

## 2018-05-25 DIAGNOSIS — H10413 Chronic giant papillary conjunctivitis, bilateral: Secondary | ICD-10-CM | POA: Diagnosis not present

## 2018-05-26 ENCOUNTER — Encounter: Payer: Self-pay | Admitting: Gastroenterology

## 2018-05-26 ENCOUNTER — Ambulatory Visit (AMBULATORY_SURGERY_CENTER): Payer: Federal, State, Local not specified - PPO | Admitting: Gastroenterology

## 2018-05-26 VITALS — BP 170/89 | HR 90 | Temp 99.6°F | Resp 15 | Ht 65.0 in | Wt 236.0 lb

## 2018-05-26 DIAGNOSIS — Z1211 Encounter for screening for malignant neoplasm of colon: Secondary | ICD-10-CM

## 2018-05-26 MED ORDER — SODIUM CHLORIDE 0.9 % IV SOLN: 500.0000 mL | Freq: Once | INTRAVENOUS | Status: DC

## 2018-05-26 NOTE — Progress Notes (Signed)
A/ox3 pleased with MAC, report to RN 

## 2018-05-26 NOTE — Op Note (Signed)
[facility] Patient Name: Chynia Brazelton Procedure Date: 05/26/2018 2:25 PM MRN: [id number] Endoscopist: Sherilyn Cooter L. Myrtie Neither , MD Age: 52 Referring MD:  Date of Birth: 1966/07/25 Gender: Female Account #: [id number] Procedure:                Colonoscopy Indications:              Screening for colorectal malignant neoplasm, This                            is the patient's first colonoscopy Medicines:                Monitored Anesthesia Care Procedure:                Pre-Anesthesia Assessment:                           - Prior to the procedure, a History and Physical                            was performed, and patient medications and                            allergies were reviewed. The patient's tolerance of                            previous anesthesia was also reviewed. The risks                            and benefits of the procedure and the sedation                            options and risks were discussed with the patient.                            All questions were answered, and informed consent                            was obtained. Prior Anticoagulants: The patient has                            taken no previous anticoagulant or antiplatelet                            agents. ASA Grade Assessment: III - A patient with                            severe systemic disease. After reviewing the risks                            and benefits, the patient was deemed in                            satisfactory condition to undergo the procedure.  After obtaining informed consent, the colonoscope                            was passed under direct vision. Throughout the                            procedure, the patient's blood pressure, pulse, and                            oxygen saturations were monitored continuously. The                            Colonoscope was introduced through the anus and                            advanced to the the  terminal ileum, with                            identification of the appendiceal orifice and IC                            valve. The colonoscopy was performed without                            difficulty. The patient tolerated the procedure                            well. The quality of the bowel preparation was                            excellent. The ileocecal valve, appendiceal                            orifice, and rectum were photographed. The quality                            of the bowel preparation was evaluated using the                            BBPS [facility] Bowel Preparation Scale) with scores                            of: Right Colon = 3, Transverse Colon = 3 and Left                            Colon = 3 (entire mucosa seen well with no residual                            staining, small fragments of stool or opaque                            liquid). The total BBPS score equals 9. Scope In: 2:42:56 PM Scope Out: 2:54:49 PM Scope Withdrawal Time:  0 hours 8 minutes 43 seconds  Total Procedure Duration: 0 hours 11 minutes 53 seconds  Findings:                 The perianal and digital rectal examinations were                            normal.                           The terminal ileum appeared normal.                           Multiple small-mouthed diverticula were found in                            the left colon and right colon.                           The exam was otherwise without abnormality on                            direct and retroflexion views. Complications:            No immediate complications. Estimated Blood Loss:     Estimated blood loss: none. Impression:               - The examined portion of the ileum was normal.                           - Diverticulosis in the left colon and in the right                            colon.                           - The examination was otherwise normal on direct                            and retroflexion  views.                           - No specimens collected. Recommendation:           - Patient has a contact number available for                            emergencies. The signs and symptoms of potential                            delayed complications were discussed with the                            patient. Return to normal activities tomorrow.                            Written discharge instructions were provided to the  patient.                           - Resume previous diet.                           - Continue present medications.                           - Repeat colonoscopy in 10 years for screening                            purposes. Makani Seckman L. Myrtie Neither, MD 05/26/2018 3:02:35 PM This report has been signed electronically.

## 2018-05-26 NOTE — Patient Instructions (Signed)
YOU HAD AN ENDOSCOPIC PROCEDURE TODAY AT THE [facility]:   Refer to the procedure report that was given to you for any specific questions about what was found during the examination.  If the procedure report does not answer your questions, please call your gastroenterologist to clarify.  If you requested that your care partner not be given the details of your procedure findings, then the procedure report has been included in a sealed envelope for you to review at your convenience later.  YOU SHOULD EXPECT: Some feelings of bloating in the abdomen. Passage of more gas than usual.  Walking can help get rid of the air that was put into your GI tract during the procedure and reduce the bloating. If you had a lower endoscopy (such as a colonoscopy or flexible sigmoidoscopy) you may notice spotting of blood in your stool or on the toilet paper. If you underwent a bowel prep for your procedure, you may not have a normal bowel movement for a few days.  Please Note:  You might notice some irritation and congestion in your nose or some drainage.  This is from the oxygen used during your procedure.  There is no need for concern and it should clear up in a day or so.  SYMPTOMS TO REPORT IMMEDIATELY:   Following lower endoscopy (colonoscopy or flexible sigmoidoscopy):  Excessive amounts of blood in the stool  Significant tenderness or worsening of abdominal pains  Swelling of the abdomen that is new, acute  Fever of 100F or higher  For urgent or emergent issues, a gastroenterologist can be reached at any hour by calling [phone].   DIET:  We do recommend a small meal at first, but then you may proceed to your regular diet.  Drink plenty of fluids but you should avoid alcoholic beverages for 24 hours.  ACTIVITY:  You should plan to take it easy for the rest of today and you should NOT DRIVE or use heavy machinery until tomorrow (because of the sedation medicines used during the test).     FOLLOW UP: Our staff will call the number listed on your records the next business day following your procedure to check on you and address any questions or concerns that you may have regarding the information given to you following your procedure. If we do not reach you, we will leave a message.  However, if you are feeling well and you are not experiencing any problems, there is no need to return our call.  We will assume that you have returned to your regular daily activities without incident.  If any biopsies were taken you will be contacted by phone or by letter within the next 1-3 weeks.  Please call us at [phone] if you have not heard about the biopsies in 3 weeks.   Repeat next screening Colonoscopy in 10 years Diverticulosis (handout given)  SIGNATURES/CONFIDENTIALITY: You and/or your care partner have signed paperwork which will be entered into your electronic medical record.  These signatures attest to the fact that that the information above on your After Visit Summary has been reviewed and is understood.  Full responsibility of the confidentiality of this discharge information lies with you and/or your care-partner. 

## 2018-05-27 ENCOUNTER — Telehealth: Payer: Self-pay

## 2018-05-27 DIAGNOSIS — J301 Allergic rhinitis due to pollen: Secondary | ICD-10-CM | POA: Diagnosis not present

## 2018-05-27 DIAGNOSIS — J3089 Other allergic rhinitis: Secondary | ICD-10-CM | POA: Diagnosis not present

## 2018-05-27 NOTE — Telephone Encounter (Signed)
Left message

## 2018-05-31 DIAGNOSIS — J3089 Other allergic rhinitis: Secondary | ICD-10-CM | POA: Diagnosis not present

## 2018-05-31 DIAGNOSIS — J301 Allergic rhinitis due to pollen: Secondary | ICD-10-CM | POA: Diagnosis not present

## 2018-06-03 DIAGNOSIS — J301 Allergic rhinitis due to pollen: Secondary | ICD-10-CM | POA: Diagnosis not present

## 2018-06-03 DIAGNOSIS — J3089 Other allergic rhinitis: Secondary | ICD-10-CM | POA: Diagnosis not present

## 2018-06-07 DIAGNOSIS — J3089 Other allergic rhinitis: Secondary | ICD-10-CM | POA: Diagnosis not present

## 2018-06-07 DIAGNOSIS — J301 Allergic rhinitis due to pollen: Secondary | ICD-10-CM | POA: Diagnosis not present

## 2018-06-15 DIAGNOSIS — J019 Acute sinusitis, unspecified: Secondary | ICD-10-CM | POA: Diagnosis not present

## 2018-06-15 DIAGNOSIS — E782 Mixed hyperlipidemia: Secondary | ICD-10-CM | POA: Diagnosis not present

## 2018-06-15 DIAGNOSIS — H66003 Acute suppurative otitis media without spontaneous rupture of ear drum, bilateral: Secondary | ICD-10-CM | POA: Diagnosis not present

## 2018-06-15 DIAGNOSIS — Z0130 Encounter for examination of blood pressure without abnormal findings: Secondary | ICD-10-CM | POA: Diagnosis not present

## 2018-06-23 DIAGNOSIS — J3089 Other allergic rhinitis: Secondary | ICD-10-CM | POA: Diagnosis not present

## 2018-06-23 DIAGNOSIS — J301 Allergic rhinitis due to pollen: Secondary | ICD-10-CM | POA: Diagnosis not present

## 2018-06-27 ENCOUNTER — Ambulatory Visit: Payer: Federal, State, Local not specified - PPO | Admitting: Family Medicine

## 2018-06-27 ENCOUNTER — Other Ambulatory Visit: Payer: Self-pay

## 2018-06-27 ENCOUNTER — Encounter: Payer: Self-pay | Admitting: Family Medicine

## 2018-06-27 VITALS — BP 118/78 | HR 98 | Temp 98.1°F | Ht 65.75 in | Wt 238.7 lb

## 2018-06-27 DIAGNOSIS — S39012A Strain of muscle, fascia and tendon of lower back, initial encounter: Secondary | ICD-10-CM

## 2018-06-27 DIAGNOSIS — J3089 Other allergic rhinitis: Secondary | ICD-10-CM | POA: Diagnosis not present

## 2018-06-27 DIAGNOSIS — J301 Allergic rhinitis due to pollen: Secondary | ICD-10-CM | POA: Diagnosis not present

## 2018-06-27 MED ORDER — CYCLOBENZAPRINE HCL 10 MG PO TABS: 10.0000 mg | ORAL_TABLET | Freq: Three times a day (TID) | ORAL | 0 refills | Status: DC | PRN

## 2018-06-27 NOTE — Progress Notes (Signed)
Subjective:     Patient ID: Catherine Campbell, female   DOB: August 05, 1966, 52 y.o.   MRN: [id number]  HPI Patient seen with muscle strain in her back region.  She states about 2 weeks ago she fell going up some stairs but had no pain after that.  She woke up this morning and after getting ready for work noticed some pain in her right side from thoracic area down toward lumbar.  No difficulties with sleeping last night.  No radiculitis symptoms.  She took some Advil without much relief.  She feels her muscles are tightening up somewhat.  She has responded well to muscle relaxers in the past.  No skin rash.  No dysuria.  No fevers or chills.  No abdominal pain.  No radiation of pain.  Pain is moderate severity.  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 Allergy   . ASCUS with positive high risk human papillomavirus of vagina 06/2013, 12/2016   Colposcopic biopsy koilocytotic atypia.  . Asthma   . Hypertension   . Low grade squamous intraepithelial lesion (LGSIL) 06/2014, 06/2015, 06/2016, 06/2017, 12/2017   positive high-risk HPV. Colposcopic biopsy LGSIL negative ECC 2015,  ECC 2017 with koilocytotic atypia changes   Past Surgical History:  Procedure Laterality Date  . BOIL UNDER ARM      reports that she has never smoked. She has never used smokeless tobacco. She reports that she does not drink alcohol or use drugs. family history includes Cancer in her mother; Colon polyps in her father and mother; Diabetes in her father; Hypertension in her father and mother. Allergies  Allergen Reactions  . Metaxalone Hives  . Penicillins Hives  . Tramadol-Acetaminophen Hives     Review of Systems  Constitutional: Negative for appetite change, chills, fever and unexpected weight change.  Respiratory: Negative for shortness of breath.   Cardiovascular: Negative for chest pain.  Gastrointestinal:  Negative for abdominal pain.  Genitourinary: Negative for dysuria.  Musculoskeletal: Positive for back pain.  Skin: Negative for rash.       Objective:   Physical Exam  Constitutional: She appears well-developed and well-nourished.  Cardiovascular: Normal rate and regular rhythm.  Pulmonary/Chest: Effort normal and breath sounds normal. No respiratory distress.  Musculoskeletal: She exhibits no edema.  Neurological:  Full strength throughout  Skin: No rash noted.       Assessment:     Patient presents with acute back pain right thoracic and lumbar region.  Suspect muscular    Plan:     -Recommend moist heat -Flexeril 10 mg nightly as needed for muscle spasm -She will try over-the-counter Aleve or Advil -Touch base if not improving over the next couple weeks  Kristian Covey MD [facility]

## 2018-06-27 NOTE — Patient Instructions (Signed)
Muscle Strain A muscle strain is an injury that occurs when a muscle is stretched beyond its normal length. Usually a small number of muscle fibers are torn when this happens. Muscle strain is rated in degrees. First-degree strains have the least amount of muscle fiber tearing and pain. Second-degree and third-degree strains have increasingly more tearing and pain. Usually, recovery from muscle strain takes 1-2 weeks. Complete healing takes 5-6 weeks. What are the causes? Muscle strain happens when a sudden, violent force placed on a muscle stretches it too far. This may occur with lifting, sports, or a fall. What increases the risk? Muscle strain is especially common in athletes. What are the signs or symptoms? At the site of the muscle strain, there may be:  Pain.  Bruising.  Swelling.  Difficulty using the muscle due to pain or lack of normal function.  How is this diagnosed? Your health care provider will perform a physical exam and ask about your medical history. How is this treated? Often, the best treatment for a muscle strain is resting, icing, and applying cold compresses to the injured area. Follow these instructions at home:  Use the PRICE method of treatment to promote muscle healing during the first 2-3 days after your injury. The PRICE method involves: ? Protecting the muscle from being injured again. ? Restricting your activity and resting the injured body part. ? Icing your injury. To do this, put ice in a plastic bag. Place a towel between your skin and the bag. Then, apply the ice and leave it on from 15-20 minutes each hour. After the third day, switch to moist heat packs. ? Apply compression to the injured area with a splint or elastic bandage. Be careful not to wrap it too tightly. This may interfere with blood circulation or increase swelling. ? Elevate the injured body part above the level of your heart as often as you can.  Only take over-the-counter or  prescription medicines for pain, discomfort, or fever as directed by your health care provider.  Warming up prior to exercise helps to prevent future muscle strains. Contact a health care provider if:  You have increasing pain or swelling in the injured area.  You have numbness, tingling, or a significant loss of strength in the injured area. This information is not intended to replace advice given to you by your health care provider. Make sure you discuss any questions you have with your health care provider. Document Released: 08/10/2005 Document Revised: 01/16/2016 Document Reviewed: 03/09/2013 Elsevier Interactive Patient Education  2017 [employer]  

## 2018-07-05 DIAGNOSIS — J3089 Other allergic rhinitis: Secondary | ICD-10-CM | POA: Diagnosis not present

## 2018-07-05 DIAGNOSIS — J301 Allergic rhinitis due to pollen: Secondary | ICD-10-CM | POA: Diagnosis not present

## 2018-07-14 DIAGNOSIS — J3089 Other allergic rhinitis: Secondary | ICD-10-CM | POA: Diagnosis not present

## 2018-07-14 DIAGNOSIS — J301 Allergic rhinitis due to pollen: Secondary | ICD-10-CM | POA: Diagnosis not present

## 2018-07-19 DIAGNOSIS — J301 Allergic rhinitis due to pollen: Secondary | ICD-10-CM | POA: Diagnosis not present

## 2018-07-19 DIAGNOSIS — J3089 Other allergic rhinitis: Secondary | ICD-10-CM | POA: Diagnosis not present

## 2018-07-25 ENCOUNTER — Other Ambulatory Visit: Payer: Self-pay | Admitting: Internal Medicine

## 2018-07-28 DIAGNOSIS — J301 Allergic rhinitis due to pollen: Secondary | ICD-10-CM | POA: Diagnosis not present

## 2018-07-28 DIAGNOSIS — J3089 Other allergic rhinitis: Secondary | ICD-10-CM | POA: Diagnosis not present

## 2018-07-29 ENCOUNTER — Encounter: Payer: Self-pay | Admitting: Gynecology

## 2018-07-29 ENCOUNTER — Ambulatory Visit (INDEPENDENT_AMBULATORY_CARE_PROVIDER_SITE_OTHER): Payer: Federal, State, Local not specified - PPO | Admitting: Gynecology

## 2018-07-29 VITALS — BP 126/82 | Ht 65.0 in | Wt 242.0 lb

## 2018-07-29 DIAGNOSIS — R87810 Cervical high risk human papillomavirus (HPV) DNA test positive: Secondary | ICD-10-CM | POA: Diagnosis not present

## 2018-07-29 DIAGNOSIS — R87612 Low grade squamous intraepithelial lesion on cytologic smear of cervix (LGSIL): Secondary | ICD-10-CM

## 2018-07-29 DIAGNOSIS — Z01419 Encounter for gynecological examination (general) (routine) without abnormal findings: Secondary | ICD-10-CM

## 2018-07-29 NOTE — Patient Instructions (Signed)
Follow-up in 1 year for annual exam 

## 2018-07-29 NOTE — Progress Notes (Signed)
Catherine Campbell 1966-05-10 [id number]        52 y.o.  G0P0000 for annual gynecologic exam.  Doing well without gynecologic complaints.  Past medical history,surgical history, problem list, medications, allergies, family history and social history were all reviewed and documented as reviewed in the EPIC chart.  ROS:  Performed with pertinent positives and negatives included in the history, assessment and plan.   Additional significant findings : None   Exam: Bari Mantis assistant Vitals:   07/29/18 1554  BP: 126/82  Weight: 242 lb (109.8 kg)  Height: 5\' 5"  (1.651 m)   Body mass index is 40.27 kg/m.  General appearance:  Normal affect, orientation and appearance. Skin: Grossly normal HEENT: Without gross lesions.  No cervical or supraclavicular adenopathy. Thyroid normal.  Lungs:  Clear without wheezing, rales or rhonchi Cardiac: RR, without RMG Abdominal:  Soft, nontender, without masses, guarding, rebound, organomegaly or hernia Breasts:  Examined lying and sitting without masses, retractions, discharge or axillary adenopathy. Pelvic:  Ext, BUS, Vagina: Normal  Cervix: Normal.  Pap smear/HPV  Uterus: Anteverted, normal size, shape and contour, midline and mobile nontender   Adnexa: Without masses or tenderness    Anus and perineum: Normal   Rectovaginal: Normal sphincter tone without palpated masses or tenderness.    Assessment/Plan:  52 y.o. G0P0000 female for annual gynecologic exam with regular menses, abstinent contraception.   1. LGSIL.  History of persistent LGSIL with positive high risk HPV.  Colposcopy 2018 was normal with negative ECC.  Follow-up Pap smear 06/2017 showed LGSIL with positive high risk HPV.  Follow-up Pap smear 12/2017 showed LGSIL.  Pap smear/HPV done today.  Will triage based upon results. 2. Continues with regular menses.  No menopausal symptoms.  We discussed what to expect in the perimenopause.  She will call if she develops significant  menopausal symptoms or significant menstrual irregularity. 3. Mammography due now and patient is going to schedule.  Breast exam normal today. 4. Colonoscopy 2019.  Repeat at their recommended interval. 5. Health maintenance.  No routine lab work done as patient does this elsewhere.  Follow-up for Pap smear results.  Follow-up in 1 year for annual exam.   [name], 4:22 PM 07/29/2018

## 2018-08-02 DIAGNOSIS — J3089 Other allergic rhinitis: Secondary | ICD-10-CM | POA: Diagnosis not present

## 2018-08-02 DIAGNOSIS — J301 Allergic rhinitis due to pollen: Secondary | ICD-10-CM | POA: Diagnosis not present

## 2018-08-05 LAB — PAP, TP IMAGING W/ HPV RNA, RFLX HPV TYPE 16,18/45: HPV DNA High Risk: DETECTED — AB

## 2018-08-08 ENCOUNTER — Encounter: Payer: Self-pay | Admitting: Gynecology

## 2018-08-11 ENCOUNTER — Ambulatory Visit: Payer: Federal, State, Local not specified - PPO | Admitting: Internal Medicine

## 2018-08-11 VITALS — BP 122/72 | HR 100 | Temp 98.2°F | Ht 65.0 in | Wt 238.1 lb

## 2018-08-11 DIAGNOSIS — E785 Hyperlipidemia, unspecified: Secondary | ICD-10-CM | POA: Diagnosis not present

## 2018-08-11 DIAGNOSIS — J301 Allergic rhinitis due to pollen: Secondary | ICD-10-CM

## 2018-08-11 DIAGNOSIS — J4520 Mild intermittent asthma, uncomplicated: Secondary | ICD-10-CM

## 2018-08-11 DIAGNOSIS — J302 Other seasonal allergic rhinitis: Secondary | ICD-10-CM

## 2018-08-11 DIAGNOSIS — J069 Acute upper respiratory infection, unspecified: Secondary | ICD-10-CM | POA: Diagnosis not present

## 2018-08-11 DIAGNOSIS — I10 Essential (primary) hypertension: Secondary | ICD-10-CM | POA: Diagnosis not present

## 2018-08-11 DIAGNOSIS — J3089 Other allergic rhinitis: Secondary | ICD-10-CM | POA: Diagnosis not present

## 2018-08-11 MED ORDER — LOSARTAN POTASSIUM 100 MG PO TABS: ORAL_TABLET | ORAL | 1 refills | Status: DC

## 2018-08-11 NOTE — Patient Instructions (Signed)
-  It was nice seeing you today!  -Refills of losartan have been sent.  -Schedule follow up in August 2020 for your annual physical. Please come in fasting at that time as we will do labwork.

## 2018-08-11 NOTE — Progress Notes (Signed)
Established Patient Office Visit     CC/Reason for Visit: Establish care, medication refills, follow-up on chronic medical conditions  HPI: Catherine Campbell is a 52 y.o. female who is coming in today for the above mentioned reasons.  Due for annual physical exam in August 2020.  Past Medical History is significant for: Hypertension that has been well controlled on losartan, she needs refills today, she also has seasonal allergies and allergic rhinitis and is followed by the allergy center for this.  History is also significant for hyperlipidemia, however she does not take statins.  She also has a history of chronic asthma that has been well controlled on Symbicort and albuterol but does not follow with this office for this.  She also complains of upper respiratory infection type symptoms today.  This started about a week ago.  There have been multiple sick contacts at her office, she mentions that they work in a closed, close environment.  She has been having nasal congestion, runny nose, sore throat.  No fevers or chills.   Past Medical/Surgical History: Past Medical History:  Diagnosis Date  . Allergy   . ASCUS with positive high risk human papillomavirus of vagina 06/2013, 12/2016   Colposcopic biopsy koilocytotic atypia.  . Asthma   . Hypertension   . Low grade squamous intraepithelial lesion (LGSIL) 06/2014, 06/2015, 06/2016, 06/2017, 12/2017, 07/2018   positive high-risk HPV. Colposcopic biopsy LGSIL negative ECC 2015,  ECC 2017 with koilocytotic atypia changes    Past Surgical History:  Procedure Laterality Date  . BOIL UNDER ARM      Social History:  reports that she has never smoked. She has never used smokeless tobacco. She reports that she does not drink alcohol or use drugs.  Allergies: Allergies  Allergen Reactions  . Metaxalone Hives  . Penicillins Hives  . Tramadol-Acetaminophen Hives    Family History:  Family History  Problem Relation Age of Onset  .  Diabetes Father   . Hypertension Father   . Colon polyps Father   . Hypertension Mother   . Cancer Mother        Pancreatic  . Colon polyps Mother   . Rectal cancer Neg Hx   . Stomach cancer Neg Hx   . Colon cancer Neg Hx      Current Outpatient Medications:  .  albuterol (PROAIR HFA) 108 (90 Base) MCG/ACT inhaler, , Disp: , Rfl:  .  azelastine (ASTELIN) 0.1 % nasal spray, INSTILL 1 PUFF IN EACH NOSTRIL TWICE A DAY NASALLY 30 DAYS, Disp: , Rfl: 3 .  cetirizine (ZYRTEC) 10 MG tablet, Take 10 mg by mouth daily., Disp: , Rfl:  .  cyclobenzaprine (FLEXERIL) 10 MG tablet, Take 1 tablet (10 mg total) by mouth 3 (three) times daily as needed for muscle spasms., Disp: 30 tablet, Rfl: 0 .  EPIPEN 2-PAK 0.3 MG/0.3ML SOAJ injection, Reported on 09/24/2015, Disp: , Rfl: 0 .  fexofenadine (ALLEGRA) 180 MG tablet, Take 180 mg by mouth daily.  , Disp: , Rfl:  .  fluorometholone (FML) 0.1 % ophthalmic suspension, SHAKE LQ AND INT 1 GTT IN OU BID, Disp: , Rfl: 1 .  fluticasone (FLONASE) 50 MCG/ACT nasal spray, INSTILL 2 SPRAYS IN EACH NOSTRIL ONCE A DAY NASALLY 30 DAYS, Disp: , Rfl: 3 .  ibuprofen (ADVIL,MOTRIN) 200 MG tablet, Take by mouth., Disp: , Rfl:  .  Influenza vac split quadrivalent PF (AFLURIA QUADRIVALENT) 0.5 ML injection, ADM 0.5ML IM UTD, Disp: , Rfl:  .  losartan (COZAAR) 100 MG tablet, TAKE 1 TABLET (100 MG TOTAL) BY MOUTH DAILY., Disp: 90 tablet, Rfl: 1 .  montelukast (SINGULAIR) 10 MG tablet, Take 10 mg by mouth at bedtime. , Disp: , Rfl:  .  RESTASIS 0.05 % ophthalmic emulsion, INSTILL 1 DROP INTO BOTH EYES TWICE A DAY, Disp: , Rfl: 3 .  SYMBICORT 80-4.5 MCG/ACT inhaler, , Disp: , Rfl:  .  UNABLE TO FIND, Allergy shots once a week, Disp: , Rfl:   Current Facility-Administered Medications:  .  0.9 %  sodium chloride infusion, 500 mL, Intravenous, Once, Danis, Starr LakeHenry L III, MD  Review of Systems:  Constitutional: Denies fever, chills, diaphoresis, appetite change and fatigue.  HEENT:  Denies photophobia, eye pain, redness, hearing loss, ear pain,  mouth sores, trouble swallowing, neck pain, neck stiffness and tinnitus.   Respiratory: Denies SOB, DOE, cough, chest tightness,  and wheezing.   Cardiovascular: Denies chest pain, palpitations and leg swelling.  Gastrointestinal: Denies nausea, vomiting, abdominal pain, diarrhea, constipation, blood in stool and abdominal distention.  Genitourinary: Denies dysuria, urgency, frequency, hematuria, flank pain and difficulty urinating.  Endocrine: Denies: hot or cold intolerance, sweats, changes in hair or nails, polyuria, polydipsia. Musculoskeletal: Denies myalgias, back pain, joint swelling, arthralgias and gait problem.  Skin: Denies pallor, rash and wound.  Neurological: Denies dizziness, seizures, syncope, weakness, light-headedness, numbness and headaches.  Hematological: Denies adenopathy. Easy bruising, personal or family bleeding history  Psychiatric/Behavioral: Denies suicidal ideation, mood changes, confusion, nervousness, sleep disturbance and agitation    Physical Exam: Vitals:   08/11/18 0811  BP: 122/72  Pulse: 100  Temp: 98.2 F (36.8 C)  TempSrc: Oral  SpO2: 96%  Weight: 238 lb 1.6 oz (108 kg)  Height: 5\' 5"  (1.651 m)    Body mass index is 39.62 kg/m.   Constitutional: NAD, calm, comfortable Eyes: PERRL, lids and conjunctivae normal ENMT: Mucous membranes are moist. Posterior pharynx clear of any exudate or lesions, mild pharyngeal erythema. Normal dentition. Tympanic membrane is pearly white, no erythema or bulging. Respiratory: clear to auscultation bilaterally, no wheezing, no crackles. Normal respiratory effort. No accessory muscle use.  Cardiovascular: Regular rate and rhythm, no murmurs / rubs / gallops. No extremity edema. 2+ pedal pulses. No carotid bruits. Musculoskeletal: no clubbing / cyanosis. No joint deformity upper and lower extremities. Good ROM, no contractures. Normal muscle tone.    Neurologic: Grossly intact and nonfocal Psychiatric: Normal judgment and insight. Alert and oriented x 3. Normal mood.    Impression and Plan:  Essential hypertension -Well-controlled on losartan, will refill today.  Hyperlipidemia, unspecified hyperlipidemia type -Last LDL was 143 in August 2019. -ASCVD ten-year risk is 3.3%. low. -Have discussed lifestyle modifications, will hold off on statin for now.  Acute upper respiratory infection -Have advised over-the-counter remedies.  Symptomatic management. -Should improve over the course of the next 5 to 7 days.  No need for antibiotic therapy.   Mild intermittent chronic asthma without complication -Stable, no issues, follows with allergy center  Seasonal allergies -On both Zyrtec and Allegra.    Patient Instructions  -It was nice seeing you today!  -Refills of losartan have been sent.  -Schedule follow up in August 2020 for your annual physical. Please come in fasting at that time as we will do labwork.     Chaya JanEstela Hernandez Acosta, MD [facility]

## 2018-08-19 DIAGNOSIS — J3089 Other allergic rhinitis: Secondary | ICD-10-CM | POA: Diagnosis not present

## 2018-08-19 DIAGNOSIS — J301 Allergic rhinitis due to pollen: Secondary | ICD-10-CM | POA: Diagnosis not present

## 2018-08-25 DIAGNOSIS — J301 Allergic rhinitis due to pollen: Secondary | ICD-10-CM | POA: Diagnosis not present

## 2018-08-25 DIAGNOSIS — J3089 Other allergic rhinitis: Secondary | ICD-10-CM | POA: Diagnosis not present

## 2018-08-30 ENCOUNTER — Encounter: Payer: Self-pay | Admitting: Family Medicine

## 2018-08-30 ENCOUNTER — Ambulatory Visit: Payer: Federal, State, Local not specified - PPO | Admitting: Family Medicine

## 2018-08-30 ENCOUNTER — Encounter: Payer: Self-pay | Admitting: *Deleted

## 2018-08-30 VITALS — BP 124/82 | HR 102 | Temp 98.1°F | Resp 12 | Ht 65.0 in | Wt 240.4 lb

## 2018-08-30 DIAGNOSIS — J4520 Mild intermittent asthma, uncomplicated: Secondary | ICD-10-CM | POA: Diagnosis not present

## 2018-08-30 DIAGNOSIS — J988 Other specified respiratory disorders: Secondary | ICD-10-CM

## 2018-08-30 DIAGNOSIS — J301 Allergic rhinitis due to pollen: Secondary | ICD-10-CM | POA: Diagnosis not present

## 2018-08-30 MED ORDER — DOXYCYCLINE HYCLATE 100 MG PO TABS: 100.0000 mg | ORAL_TABLET | Freq: Two times a day (BID) | ORAL | 0 refills | Status: AC

## 2018-08-30 MED ORDER — PREDNISONE 20 MG PO TABS: 40.0000 mg | ORAL_TABLET | Freq: Every day | ORAL | 0 refills | Status: AC

## 2018-08-30 NOTE — Patient Instructions (Addendum)
  Catherine Campbell I have seen you today for an acute visit.  A few things to remember from today's visit:   Non-seasonal allergic rhinitis due to pollen  Mild intermittent chronic asthma without complication - Plan: predniSONE (DELTASONE) 20 MG tablet  Respiratory tract infection - Plan: doxycycline (VIBRA-TABS) 100 MG tablet   If medications prescribed today, they will not be refill upon request, a follow up appointment with PCP will be necessary to discuss continuation of of treatment if appropriate.  Sinus pain could be related to allergy rhinitis. Nasal saline irrigations are outlined per day. Continue allergy medications.  Prednisone with breakfast for 3 days. Albuterol inh 2 puff every 6 hours for a week then as needed for wheezing or shortness of breath.  No changes in Symbicort.   In general please monitor for signs of worsening symptoms and seek immediate medical attention if any concerning.  If symptoms are not resolved in 1-2 weeks you should schedule a follow up appointment with your doctor, before if needed.  I hope you get better soon!

## 2018-08-30 NOTE — Progress Notes (Signed)
ACUTE VISIT  HPI:  Chief Complaint  Patient presents with  . Head congestion    sx started 2 weeks ago  . Nasal Congestion    Catherine Campbell is a 53 y.o.female here today complaining of 2 weeks of respiratory symptoms. Non productive cough.  Low grade fever, 98-99 F max. + Chills. Denies body aches,sore throat, or rhinorrhea.  Intermittent wheezing, denies dyspnea or chest pain.  Cough  This is a new problem. The current episode started 1 to 4 weeks ago. The problem has been unchanged. The cough is non-productive. Associated symptoms include nasal congestion and postnasal drip. Pertinent negatives include no chest pain, ear pain, eye redness, fever, headaches, heartburn, hemoptysis, myalgias, rash, rhinorrhea, sore throat, shortness of breath or wheezing. The symptoms are aggravated by exercise. She has tried a beta-agonist inhaler for the symptoms. The treatment provided mild relief. Her past medical history is significant for asthma and environmental allergies.     No Hx of recent travel. 3 coworkers have been sick in the past few days. No known insect bite.  Hx of allergies: History of asthma and seasonal allergies.  Currently she is on Allegra, Flonase, Singulair, Symbicort, and albuterol. Last time she used albuterol inhaler was last night.  OTC medications for this problem: Tylenol Cold and sinus. Helps with symptoms temporarily.    Review of Systems  Constitutional: Negative for activity change, appetite change, fatigue and fever.  HENT: Positive for congestion and postnasal drip. Negative for ear pain, mouth sores, rhinorrhea, sinus pressure, sneezing, sore throat, trouble swallowing and voice change.   Eyes: Negative for discharge, redness and itching.  Respiratory: Positive for cough. Negative for hemoptysis, shortness of breath and wheezing.   Cardiovascular: Negative.  Negative for chest pain.  Gastrointestinal: Negative for abdominal pain,  diarrhea, heartburn, nausea and vomiting.  Musculoskeletal: Negative for back pain, joint swelling, myalgias and neck pain.  Skin: Negative for rash.  Allergic/Immunologic: Positive for environmental allergies.  Neurological: Negative for weakness, numbness and headaches.  Hematological: Negative for adenopathy. Does not bruise/bleed easily.      Current Outpatient Medications on File Prior to Visit  Medication Sig Dispense Refill  . albuterol (PROAIR HFA) 108 (90 Base) MCG/ACT inhaler     . azelastine (ASTELIN) 0.1 % nasal spray INSTILL 1 PUFF IN EACH NOSTRIL TWICE A DAY NASALLY 30 DAYS  3  . cetirizine (ZYRTEC) 10 MG tablet Take 10 mg by mouth daily.    Marland Kitchen. EPIPEN 2-PAK 0.3 MG/0.3ML SOAJ injection Reported on 09/24/2015  0  . fexofenadine (ALLEGRA) 180 MG tablet Take 180 mg by mouth daily.      . fluticasone (FLONASE) 50 MCG/ACT nasal spray INSTILL 2 SPRAYS IN EACH NOSTRIL ONCE A DAY NASALLY 30 DAYS  3  . ibuprofen (ADVIL,MOTRIN) 200 MG tablet Take by mouth.    . Influenza vac split quadrivalent PF (AFLURIA QUADRIVALENT) 0.5 ML injection ADM 0.5ML IM UTD    . losartan (COZAAR) 100 MG tablet TAKE 1 TABLET (100 MG TOTAL) BY MOUTH DAILY. 90 tablet 1  . montelukast (SINGULAIR) 10 MG tablet Take 10 mg by mouth at bedtime.     . RESTASIS 0.05 % ophthalmic emulsion INSTILL 1 DROP INTO BOTH EYES TWICE A DAY  3  . SYMBICORT 80-4.5 MCG/ACT inhaler     . UNABLE TO FIND Allergy shots once a week     Current Facility-Administered Medications on File Prior to Visit  Medication Dose Route Frequency Provider Last  Rate Last Dose  . 0.9 %  sodium chloride infusion  500 mL Intravenous Once Sherrilyn Rist, MD         Past Medical History:  Diagnosis Date  . Allergy   . ASCUS with positive high risk human papillomavirus of vagina 06/2013, 12/2016   Colposcopic biopsy koilocytotic atypia.  . Asthma   . Hypertension   . Low grade squamous intraepithelial lesion (LGSIL) 06/2014, 06/2015, 06/2016,  06/2017, 12/2017, 07/2018   positive high-risk HPV. Colposcopic biopsy LGSIL negative ECC 2015,  ECC 2017 with koilocytotic atypia changes   Allergies  Allergen Reactions  . Metaxalone Hives  . Penicillins Hives  . Tramadol-Acetaminophen Hives    Social History   Socioeconomic History  . Marital status: Single    Spouse name: Not on file  . Number of children: Not on file  . Years of education: Not on file  . Highest education level: Not on file  Occupational History  . Not on file  Social Needs  . Financial resource strain: Not on file  . Food insecurity:    Worry: Not on file    Inability: Not on file  . Transportation needs:    Medical: Not on file    Non-medical: Not on file  Tobacco Use  . Smoking status: Never Smoker  . Smokeless tobacco: Never Used  Substance and Sexual Activity  . Alcohol use: No    Alcohol/week: 0.0 standard drinks  . Drug use: No  . Sexual activity: Not Currently    Comment: 1st intercourse 53 yo-Fewer than 5 partners,des neg  Lifestyle  . Physical activity:    Days per week: Not on file    Minutes per session: Not on file  . Stress: Not on file  Relationships  . Social connections:    Talks on phone: Not on file    Gets together: Not on file    Attends religious service: Not on file    Active member of club or organization: Not on file    Attends meetings of clubs or organizations: Not on file    Relationship status: Not on file  Other Topics Concern  . Not on file  Social History Narrative  . Not on file    Vitals:   08/30/18 0832  BP: 124/82  Pulse: (!) 102  Resp: 12  Temp: 98.1 F (36.7 C)  SpO2: 99%   Body mass index is 40 kg/m.   Physical Exam  Nursing note and vitals reviewed. Constitutional: She is oriented to person, place, and time. She appears well-developed. She does not appear ill. No distress.  HENT:  Head: Normocephalic and atraumatic.  Right Ear: Tympanic membrane, external ear and ear canal normal.    Left Ear: Tympanic membrane, external ear and ear canal normal.  Nose: Rhinorrhea present. Right sinus exhibits maxillary sinus tenderness and frontal sinus tenderness. Left sinus exhibits maxillary sinus tenderness and frontal sinus tenderness.  Mouth/Throat: Oropharynx is clear and moist and mucous membranes are normal.  Mild sinus tenderness with palpation. Hypertrophic turbinates. Nasal voice. Postnasal drainage  Eyes: Conjunctivae are normal.  Neck: No muscular tenderness present. No edema and no erythema present.  Cardiovascular: Normal rate and regular rhythm.  No murmur heard. Respiratory: Effort normal. No stridor. No respiratory distress. She has no decreased breath sounds. She has no wheezes.  Mild coarse respiratory sounds left base, it does not clear with cough.  Lymphadenopathy:       Head (right side): No submandibular  adenopathy present.       Head (left side): No submandibular adenopathy present.    She has no cervical adenopathy.  Neurological: She is alert and oriented to person, place, and time. She has normal strength.  Skin: Skin is warm. No rash noted. No erythema.  Psychiatric: Her mood appears anxious.  Well groomed, good eye contact.      ASSESSMENT AND PLAN:  Catherine Campbell was seen today for head congestion and nasal congestion.  Diagnoses and all orders for this visit:  Respiratory tract infection Auscultation negative for rales. She is requesting abx for sinus infection, sinus pressure can be related to congestion. Side effects of abx discussed. F/U with PCP as needed.  Instructed about warning signs.  -     doxycycline (VIBRA-TABS) 100 MG tablet; Take 1 tablet (100 mg total) by mouth 2 (two) times daily for 7 days.  Non-seasonal allergic rhinitis due to pollen Could be contributing to above symptoms. No changes in current management. Nasal saline irrigations a few times during the day recommended.   Mild intermittent chronic asthma without  complication Today no wheezing on auscultation. Albuterol inh 2 puff every 6 hours for a week then as needed for wheezing or shortness of breath.  Short course of prednisone recommended,side effects discussed.  -     predniSONE (DELTASONE) 20 MG tablet; Take 2 tablets (40 mg total) by mouth daily with breakfast for 3 days.    Return if symptoms worsen or fail to improve.     Kandise Riehle G. [place]azil[place]Jordan, MD  [facility]. [place] office.

## 2018-09-04 ENCOUNTER — Encounter: Payer: Self-pay | Admitting: Family Medicine

## 2018-09-09 DIAGNOSIS — J3089 Other allergic rhinitis: Secondary | ICD-10-CM | POA: Diagnosis not present

## 2018-09-09 DIAGNOSIS — J301 Allergic rhinitis due to pollen: Secondary | ICD-10-CM | POA: Diagnosis not present

## 2018-09-12 DIAGNOSIS — J301 Allergic rhinitis due to pollen: Secondary | ICD-10-CM | POA: Diagnosis not present

## 2018-09-12 DIAGNOSIS — J3089 Other allergic rhinitis: Secondary | ICD-10-CM | POA: Diagnosis not present

## 2018-09-22 ENCOUNTER — Encounter: Payer: Self-pay | Admitting: Internal Medicine

## 2018-09-22 ENCOUNTER — Ambulatory Visit: Payer: Federal, State, Local not specified - PPO | Admitting: Internal Medicine

## 2018-09-22 ENCOUNTER — Ambulatory Visit (INDEPENDENT_AMBULATORY_CARE_PROVIDER_SITE_OTHER): Payer: Federal, State, Local not specified - PPO

## 2018-09-22 VITALS — BP 120/78 | HR 101 | Temp 98.3°F | Wt 245.0 lb

## 2018-09-22 DIAGNOSIS — R05 Cough: Secondary | ICD-10-CM | POA: Diagnosis not present

## 2018-09-22 DIAGNOSIS — H66002 Acute suppurative otitis media without spontaneous rupture of ear drum, left ear: Secondary | ICD-10-CM | POA: Diagnosis not present

## 2018-09-22 DIAGNOSIS — R0989 Other specified symptoms and signs involving the circulatory and respiratory systems: Secondary | ICD-10-CM | POA: Diagnosis not present

## 2018-09-22 MED ORDER — LEVOFLOXACIN 500 MG PO TABS: 500.0000 mg | ORAL_TABLET | Freq: Every day | ORAL | 0 refills | Status: DC

## 2018-09-22 NOTE — Progress Notes (Signed)
Acute Office Visit     CC/Reason for Visit: Cough, nasal congestion, left ear pain  HPI: Catherine Campbell is a 53 y.o. female who is coming in today for the above mentioned reasons. She was seen on 1/7 by Dr. [place]Jordan for the same symptoms and was given a prednisone course, doxycycline and was told to take mucinex D. She noticed some improvement but symptoms have returned. Predominantly a non-productive cough that has been unrelenting. Over the past 3 days has also noticed left ear pain which is new. Nasal congestion remains unchanged. Denies fevers, SOB.   Past Medical/Surgical History: Past Medical History:  Diagnosis Date  . Allergy   . ASCUS with positive high risk human papillomavirus of vagina 06/2013, 12/2016   Colposcopic biopsy koilocytotic atypia.  . Asthma   . Hypertension   . Low grade squamous intraepithelial lesion (LGSIL) 06/2014, 06/2015, 06/2016, 06/2017, 12/2017, 07/2018   positive high-risk HPV. Colposcopic biopsy LGSIL negative ECC 2015,  ECC 2017 with koilocytotic atypia changes    Past Surgical History:  Procedure Laterality Date  . BOIL UNDER ARM      Social History:  reports that she has never smoked. She has never used smokeless tobacco. She reports that she does not drink alcohol or use drugs.  Allergies: Allergies  Allergen Reactions  . Metaxalone Hives  . Penicillins Hives  . Tramadol-Acetaminophen Hives    Family History:  Family History  Problem Relation Age of Onset  . Diabetes Father   . Hypertension Father   . Colon polyps Father   . Hypertension Mother   . Cancer Mother        Pancreatic  . Colon polyps Mother   . Rectal cancer Neg Hx   . Stomach cancer Neg Hx   . Colon cancer Neg Hx      Current Outpatient Medications:  .  albuterol (PROAIR HFA) 108 (90 Base) MCG/ACT inhaler, , Disp: , Rfl:  .  azelastine (ASTELIN) 0.1 % nasal spray, INSTILL 1 PUFF IN EACH NOSTRIL TWICE A DAY NASALLY 30 DAYS, Disp: , Rfl: 3 .  cetirizine  (ZYRTEC) 10 MG tablet, Take 10 mg by mouth daily., Disp: , Rfl:  .  EPIPEN 2-PAK 0.3 MG/0.3ML SOAJ injection, Reported on 09/24/2015, Disp: , Rfl: 0 .  fexofenadine (ALLEGRA) 180 MG tablet, Take 180 mg by mouth daily.  , Disp: , Rfl:  .  fluticasone (FLONASE) 50 MCG/ACT nasal spray, INSTILL 2 SPRAYS IN EACH NOSTRIL ONCE A DAY NASALLY 30 DAYS, Disp: , Rfl: 3 .  ibuprofen (ADVIL,MOTRIN) 200 MG tablet, Take by mouth., Disp: , Rfl:  .  Influenza vac split quadrivalent PF (AFLURIA QUADRIVALENT) 0.5 ML injection, ADM 0.5ML IM UTD, Disp: , Rfl:  .  losartan (COZAAR) 100 MG tablet, TAKE 1 TABLET (100 MG TOTAL) BY MOUTH DAILY., Disp: 90 tablet, Rfl: 1 .  montelukast (SINGULAIR) 10 MG tablet, Take 10 mg by mouth at bedtime. , Disp: , Rfl:  .  RESTASIS 0.05 % ophthalmic emulsion, INSTILL 1 DROP INTO BOTH EYES TWICE A DAY, Disp: , Rfl: 3 .  SYMBICORT 80-4.5 MCG/ACT inhaler, , Disp: , Rfl:  .  UNABLE TO FIND, Allergy shots once a week, Disp: , Rfl:  .  levofloxacin (LEVAQUIN) 500 MG tablet, Take 1 tablet (500 mg total) by mouth daily., Disp: 7 tablet, Rfl: 0  Current Facility-Administered Medications:  .  0.9 %  sodium chloride infusion, 500 mL, Intravenous, Once, Danis, Andreas BlowerHenry L III, MD  Review of Systems:  Constitutional: Denies fever, chills, diaphoresis, appetite change and fatigue.  HEENT: Denies photophobia, eye pain, redness, hearing loss,  sneezing, mouth sores, trouble swallowing, neck pain, neck stiffness and tinnitus.   Respiratory: Denies SOB, DOE, cough, chest tightness,  and wheezing.   Cardiovascular: Denies chest pain, palpitations and leg swelling.  Gastrointestinal: Denies nausea, vomiting, abdominal pain, diarrhea, constipation, blood in stool and abdominal distention.  Genitourinary: Denies dysuria, urgency, frequency, hematuria, flank pain and difficulty urinating.  Endocrine: Denies: hot or cold intolerance, sweats, changes in hair or nails, polyuria, polydipsia. Musculoskeletal:  Denies myalgias, back pain, joint swelling, arthralgias and gait problem.  Skin: Denies pallor, rash and wound.  Neurological: Denies dizziness, seizures, syncope, weakness, light-headedness, numbness and headaches.  Hematological: Denies adenopathy. Easy bruising, personal or family bleeding history  Psychiatric/Behavioral: Denies suicidal ideation, mood changes, confusion, nervousness, sleep disturbance and agitation    Physical Exam: Vitals:   09/22/18 1630  BP: 120/78  Pulse: (!) 101  Temp: 98.3 F (36.8 C)  TempSrc: Oral  SpO2: 97%  Weight: 245 lb (111.1 kg)    Body mass index is 40.77 kg/m.   Constitutional: NAD, calm, comfortable Eyes: PERRL, lids and conjunctivae normal ENMT: Mucous membranes are moist. Posterior pharynx is erythematous clear of any exudate or lesions. Normal dentition. Tympanic membrane is pearly white, no erythema or bulging on the right. Left TM is erythematous and bulging. Respiratory: crackles to right base, no wheezing,  Normal respiratory effort. No accessory muscle use.  Cardiovascular: Regular rate and rhythm, no murmurs / rubs / gallops. No extremity edema. 2+ pedal pulses. No carotid bruits.  Ext: no C/C/E, pos pedal pulses Neuro: grossly intact and non-focal   Impression and Plan:  Non-recurrent acute suppurative otitis media of left ear without spontaneous rupture of tympanic membrane  -Will give a course of Levaquin for 7 days (PCN allergy). -Can continue use of mucinex DM.  Abnormal lung sounds  -Right base crackles. -Check CXR to rule put PNA. -If positive, levaquin should cover as well.    Patient Instructions  -I hope you feel better soon!  -Levaquin 500 mg daily for 7 days.  -Chest xray today. Will notify you when results are available.     Chaya JanEstela Hernandez Acosta, MD [facility]

## 2018-09-22 NOTE — Patient Instructions (Signed)
-  I hope you feel better soon!  -Levaquin 500 mg daily for 7 days.  -Chest xray today. Will notify you when results are available.

## 2018-09-28 DIAGNOSIS — J301 Allergic rhinitis due to pollen: Secondary | ICD-10-CM | POA: Diagnosis not present

## 2018-09-28 DIAGNOSIS — J3089 Other allergic rhinitis: Secondary | ICD-10-CM | POA: Diagnosis not present

## 2018-10-04 DIAGNOSIS — J301 Allergic rhinitis due to pollen: Secondary | ICD-10-CM | POA: Diagnosis not present

## 2018-10-04 DIAGNOSIS — J3089 Other allergic rhinitis: Secondary | ICD-10-CM | POA: Diagnosis not present

## 2018-10-10 DIAGNOSIS — J3089 Other allergic rhinitis: Secondary | ICD-10-CM | POA: Diagnosis not present

## 2018-10-10 DIAGNOSIS — J301 Allergic rhinitis due to pollen: Secondary | ICD-10-CM | POA: Diagnosis not present

## 2018-10-11 DIAGNOSIS — J301 Allergic rhinitis due to pollen: Secondary | ICD-10-CM | POA: Diagnosis not present

## 2018-10-12 DIAGNOSIS — J3089 Other allergic rhinitis: Secondary | ICD-10-CM | POA: Diagnosis not present

## 2018-10-16 DIAGNOSIS — H10023 Other mucopurulent conjunctivitis, bilateral: Secondary | ICD-10-CM | POA: Diagnosis not present

## 2018-10-16 DIAGNOSIS — I10 Essential (primary) hypertension: Secondary | ICD-10-CM | POA: Diagnosis not present

## 2018-10-20 DIAGNOSIS — J301 Allergic rhinitis due to pollen: Secondary | ICD-10-CM | POA: Diagnosis not present

## 2018-10-20 DIAGNOSIS — J3089 Other allergic rhinitis: Secondary | ICD-10-CM | POA: Diagnosis not present

## 2018-10-25 ENCOUNTER — Encounter: Payer: Self-pay | Admitting: Gynecology

## 2018-10-25 DIAGNOSIS — Z1231 Encounter for screening mammogram for malignant neoplasm of breast: Secondary | ICD-10-CM | POA: Diagnosis not present

## 2018-10-28 DIAGNOSIS — J3089 Other allergic rhinitis: Secondary | ICD-10-CM | POA: Diagnosis not present

## 2018-10-28 DIAGNOSIS — J4540 Moderate persistent asthma, uncomplicated: Secondary | ICD-10-CM | POA: Diagnosis not present

## 2018-10-28 DIAGNOSIS — J301 Allergic rhinitis due to pollen: Secondary | ICD-10-CM | POA: Diagnosis not present

## 2018-10-28 DIAGNOSIS — H1045 Other chronic allergic conjunctivitis: Secondary | ICD-10-CM | POA: Diagnosis not present

## 2018-11-01 DIAGNOSIS — J3089 Other allergic rhinitis: Secondary | ICD-10-CM | POA: Diagnosis not present

## 2018-11-01 DIAGNOSIS — J301 Allergic rhinitis due to pollen: Secondary | ICD-10-CM | POA: Diagnosis not present

## 2018-11-04 DIAGNOSIS — J3089 Other allergic rhinitis: Secondary | ICD-10-CM | POA: Diagnosis not present

## 2018-11-04 DIAGNOSIS — J301 Allergic rhinitis due to pollen: Secondary | ICD-10-CM | POA: Diagnosis not present

## 2018-11-08 DIAGNOSIS — J301 Allergic rhinitis due to pollen: Secondary | ICD-10-CM | POA: Diagnosis not present

## 2018-11-08 DIAGNOSIS — J3089 Other allergic rhinitis: Secondary | ICD-10-CM | POA: Diagnosis not present

## 2018-11-10 ENCOUNTER — Other Ambulatory Visit: Payer: Self-pay

## 2018-11-10 DIAGNOSIS — J301 Allergic rhinitis due to pollen: Secondary | ICD-10-CM | POA: Diagnosis not present

## 2018-11-10 DIAGNOSIS — J3089 Other allergic rhinitis: Secondary | ICD-10-CM | POA: Diagnosis not present

## 2018-11-11 ENCOUNTER — Ambulatory Visit: Payer: Federal, State, Local not specified - PPO | Admitting: Gynecology

## 2018-11-11 ENCOUNTER — Encounter: Payer: Self-pay | Admitting: Gynecology

## 2018-11-11 VITALS — BP 124/78

## 2018-11-11 DIAGNOSIS — R87612 Low grade squamous intraepithelial lesion on cytologic smear of cervix (LGSIL): Secondary | ICD-10-CM | POA: Diagnosis not present

## 2018-11-11 DIAGNOSIS — R21 Rash and other nonspecific skin eruption: Secondary | ICD-10-CM | POA: Diagnosis not present

## 2018-11-11 DIAGNOSIS — R87810 Cervical high risk human papillomavirus (HPV) DNA test positive: Secondary | ICD-10-CM

## 2018-11-11 MED ORDER — NYSTATIN-TRIAMCINOLONE 100000-0.1 UNIT/GM-% EX CREA: 1.0000 "application " | TOPICAL_CREAM | Freq: Two times a day (BID) | CUTANEOUS | 0 refills | Status: DC

## 2018-11-11 NOTE — Progress Notes (Signed)
Catherine Campbell Jan 26, 1966 [id number]        53 y.o.  G0P0000 with long history of LGSIL and positive high risk HPV.  Most recent Pap smear 07/2018 showed LGSIL with positive high risk HPV.  Last colposcopy 2018 was normal with negative ECC.  Presents now for colposcopy.  Also notes rash in her right groin following a course of antibiotics for bronchitis.  Past medical history,surgical history, problem list, medications, allergies, family history and social history were all reviewed and documented in the EPIC chart.  Directed ROS with pertinent positives and negatives documented in the history of present illness/assessment and plan.  Exam: Catherine Campbell assistant Vitals:   11/11/18 1436  BP: 124/78   General appearance:  Normal Abdomen soft nontender without masses guarding rebound Pelvic external BUS vagina normal.  Classic appearing yeast skin rash right groin folds.  Cervix normal.  Uterus normal size midline mobile nontender.  Adnexa without masses or tenderness.  Colposcopy performed after acetic acid cleanse was adequate noting transformation zone visualized 360 degrees.  No abnormalities seen.  ECC performed.  Patient tolerated well.  Physical Exam  Genitourinary:        Assessment/Plan:  53 y.o. G0P0000 with persistent LGSIL and positive high risk HPV.  Colposcopy today is adequate normal.  ECC performed.  We again discussed dysplasia, high-grade/low-grade, progression/regression and the positive HPV.  Will follow-up for ECC results.  If negative then plan follow-up Pap smear/HPV in 1 year.  If otherwise then will triage based upon results.  Classic yeast skin infection right groin.  Mytrex cream 30 g tube applied twice daily.  Follow-up rash persists, worsens or recurs.    [name], 3:00 PM 11/11/2018

## 2018-11-11 NOTE — Patient Instructions (Signed)
Apply the prescribed cream to the skin rash twice daily.  Follow-up if rash continues.  Office will call you with the biopsy results from the colposcopy.

## 2018-11-15 DIAGNOSIS — J3089 Other allergic rhinitis: Secondary | ICD-10-CM | POA: Diagnosis not present

## 2018-11-15 DIAGNOSIS — J301 Allergic rhinitis due to pollen: Secondary | ICD-10-CM | POA: Diagnosis not present

## 2018-11-15 LAB — TISSUE SPECIMEN

## 2018-11-15 LAB — PATHOLOGY

## 2018-11-18 DIAGNOSIS — J3089 Other allergic rhinitis: Secondary | ICD-10-CM | POA: Diagnosis not present

## 2018-11-18 DIAGNOSIS — J301 Allergic rhinitis due to pollen: Secondary | ICD-10-CM | POA: Diagnosis not present

## 2018-11-22 DIAGNOSIS — J301 Allergic rhinitis due to pollen: Secondary | ICD-10-CM | POA: Diagnosis not present

## 2018-11-22 DIAGNOSIS — J3089 Other allergic rhinitis: Secondary | ICD-10-CM | POA: Diagnosis not present

## 2018-11-30 DIAGNOSIS — J301 Allergic rhinitis due to pollen: Secondary | ICD-10-CM | POA: Diagnosis not present

## 2018-11-30 DIAGNOSIS — J3089 Other allergic rhinitis: Secondary | ICD-10-CM | POA: Diagnosis not present

## 2018-12-09 DIAGNOSIS — J3089 Other allergic rhinitis: Secondary | ICD-10-CM | POA: Diagnosis not present

## 2018-12-09 DIAGNOSIS — J301 Allergic rhinitis due to pollen: Secondary | ICD-10-CM | POA: Diagnosis not present

## 2018-12-14 DIAGNOSIS — J3089 Other allergic rhinitis: Secondary | ICD-10-CM | POA: Diagnosis not present

## 2018-12-14 DIAGNOSIS — J301 Allergic rhinitis due to pollen: Secondary | ICD-10-CM | POA: Diagnosis not present

## 2018-12-23 DIAGNOSIS — J301 Allergic rhinitis due to pollen: Secondary | ICD-10-CM | POA: Diagnosis not present

## 2018-12-23 DIAGNOSIS — J3089 Other allergic rhinitis: Secondary | ICD-10-CM | POA: Diagnosis not present

## 2018-12-29 DIAGNOSIS — J3089 Other allergic rhinitis: Secondary | ICD-10-CM | POA: Diagnosis not present

## 2018-12-29 DIAGNOSIS — J301 Allergic rhinitis due to pollen: Secondary | ICD-10-CM | POA: Diagnosis not present

## 2019-01-05 DIAGNOSIS — J3089 Other allergic rhinitis: Secondary | ICD-10-CM | POA: Diagnosis not present

## 2019-01-05 DIAGNOSIS — J301 Allergic rhinitis due to pollen: Secondary | ICD-10-CM | POA: Diagnosis not present

## 2019-01-12 DIAGNOSIS — J3089 Other allergic rhinitis: Secondary | ICD-10-CM | POA: Diagnosis not present

## 2019-01-12 DIAGNOSIS — J301 Allergic rhinitis due to pollen: Secondary | ICD-10-CM | POA: Diagnosis not present

## 2019-01-17 DIAGNOSIS — J3089 Other allergic rhinitis: Secondary | ICD-10-CM | POA: Diagnosis not present

## 2019-01-17 DIAGNOSIS — J3081 Allergic rhinitis due to animal (cat) (dog) hair and dander: Secondary | ICD-10-CM | POA: Diagnosis not present

## 2019-01-17 DIAGNOSIS — J301 Allergic rhinitis due to pollen: Secondary | ICD-10-CM | POA: Diagnosis not present

## 2019-01-25 DIAGNOSIS — J301 Allergic rhinitis due to pollen: Secondary | ICD-10-CM | POA: Diagnosis not present

## 2019-01-25 DIAGNOSIS — J3089 Other allergic rhinitis: Secondary | ICD-10-CM | POA: Diagnosis not present

## 2019-01-31 DIAGNOSIS — H10413 Chronic giant papillary conjunctivitis, bilateral: Secondary | ICD-10-CM | POA: Diagnosis not present

## 2019-01-31 DIAGNOSIS — H524 Presbyopia: Secondary | ICD-10-CM | POA: Diagnosis not present

## 2019-01-31 DIAGNOSIS — H52203 Unspecified astigmatism, bilateral: Secondary | ICD-10-CM | POA: Diagnosis not present

## 2019-01-31 DIAGNOSIS — H5213 Myopia, bilateral: Secondary | ICD-10-CM | POA: Diagnosis not present

## 2019-01-31 DIAGNOSIS — H16223 Keratoconjunctivitis sicca, not specified as Sjogren's, bilateral: Secondary | ICD-10-CM | POA: Diagnosis not present

## 2019-02-02 ENCOUNTER — Other Ambulatory Visit: Payer: Self-pay | Admitting: Internal Medicine

## 2019-02-02 DIAGNOSIS — J301 Allergic rhinitis due to pollen: Secondary | ICD-10-CM | POA: Diagnosis not present

## 2019-02-02 DIAGNOSIS — I10 Essential (primary) hypertension: Secondary | ICD-10-CM

## 2019-02-02 DIAGNOSIS — J3089 Other allergic rhinitis: Secondary | ICD-10-CM | POA: Diagnosis not present

## 2019-02-09 DIAGNOSIS — J3089 Other allergic rhinitis: Secondary | ICD-10-CM | POA: Diagnosis not present

## 2019-02-09 DIAGNOSIS — J301 Allergic rhinitis due to pollen: Secondary | ICD-10-CM | POA: Diagnosis not present

## 2019-02-14 DIAGNOSIS — J3089 Other allergic rhinitis: Secondary | ICD-10-CM | POA: Diagnosis not present

## 2019-02-14 DIAGNOSIS — J301 Allergic rhinitis due to pollen: Secondary | ICD-10-CM | POA: Diagnosis not present

## 2019-02-14 DIAGNOSIS — J3081 Allergic rhinitis due to animal (cat) (dog) hair and dander: Secondary | ICD-10-CM | POA: Diagnosis not present

## 2019-02-15 DIAGNOSIS — J3089 Other allergic rhinitis: Secondary | ICD-10-CM | POA: Diagnosis not present

## 2019-02-15 DIAGNOSIS — J301 Allergic rhinitis due to pollen: Secondary | ICD-10-CM | POA: Diagnosis not present

## 2019-02-15 DIAGNOSIS — J3081 Allergic rhinitis due to animal (cat) (dog) hair and dander: Secondary | ICD-10-CM | POA: Diagnosis not present

## 2019-02-20 DIAGNOSIS — J301 Allergic rhinitis due to pollen: Secondary | ICD-10-CM | POA: Diagnosis not present

## 2019-02-20 DIAGNOSIS — J3089 Other allergic rhinitis: Secondary | ICD-10-CM | POA: Diagnosis not present

## 2019-03-02 DIAGNOSIS — J301 Allergic rhinitis due to pollen: Secondary | ICD-10-CM | POA: Diagnosis not present

## 2019-03-02 DIAGNOSIS — J3089 Other allergic rhinitis: Secondary | ICD-10-CM | POA: Diagnosis not present

## 2019-03-10 DIAGNOSIS — J3089 Other allergic rhinitis: Secondary | ICD-10-CM | POA: Diagnosis not present

## 2019-03-10 DIAGNOSIS — J301 Allergic rhinitis due to pollen: Secondary | ICD-10-CM | POA: Diagnosis not present

## 2019-03-15 DIAGNOSIS — J3089 Other allergic rhinitis: Secondary | ICD-10-CM | POA: Diagnosis not present

## 2019-03-15 DIAGNOSIS — J301 Allergic rhinitis due to pollen: Secondary | ICD-10-CM | POA: Diagnosis not present

## 2019-03-21 DIAGNOSIS — J3089 Other allergic rhinitis: Secondary | ICD-10-CM | POA: Diagnosis not present

## 2019-03-21 DIAGNOSIS — J301 Allergic rhinitis due to pollen: Secondary | ICD-10-CM | POA: Diagnosis not present

## 2019-03-24 DIAGNOSIS — J3089 Other allergic rhinitis: Secondary | ICD-10-CM | POA: Diagnosis not present

## 2019-03-24 DIAGNOSIS — J301 Allergic rhinitis due to pollen: Secondary | ICD-10-CM | POA: Diagnosis not present

## 2019-03-28 DIAGNOSIS — J3089 Other allergic rhinitis: Secondary | ICD-10-CM | POA: Diagnosis not present

## 2019-03-28 DIAGNOSIS — J301 Allergic rhinitis due to pollen: Secondary | ICD-10-CM | POA: Diagnosis not present

## 2019-03-30 DIAGNOSIS — J3089 Other allergic rhinitis: Secondary | ICD-10-CM | POA: Diagnosis not present

## 2019-03-30 DIAGNOSIS — J301 Allergic rhinitis due to pollen: Secondary | ICD-10-CM | POA: Diagnosis not present

## 2019-04-04 ENCOUNTER — Ambulatory Visit (INDEPENDENT_AMBULATORY_CARE_PROVIDER_SITE_OTHER): Payer: Federal, State, Local not specified - PPO | Admitting: Internal Medicine

## 2019-04-04 ENCOUNTER — Other Ambulatory Visit: Payer: Self-pay

## 2019-04-04 ENCOUNTER — Other Ambulatory Visit: Payer: Self-pay | Admitting: Internal Medicine

## 2019-04-04 ENCOUNTER — Encounter: Payer: Self-pay | Admitting: Internal Medicine

## 2019-04-04 VITALS — BP 110/80 | HR 98 | Temp 97.6°F | Ht 65.0 in | Wt 242.6 lb

## 2019-04-04 DIAGNOSIS — E538 Deficiency of other specified B group vitamins: Secondary | ICD-10-CM | POA: Insufficient documentation

## 2019-04-04 DIAGNOSIS — E785 Hyperlipidemia, unspecified: Secondary | ICD-10-CM | POA: Diagnosis not present

## 2019-04-04 DIAGNOSIS — Z23 Encounter for immunization: Secondary | ICD-10-CM

## 2019-04-04 DIAGNOSIS — J4520 Mild intermittent asthma, uncomplicated: Secondary | ICD-10-CM

## 2019-04-04 DIAGNOSIS — J3089 Other allergic rhinitis: Secondary | ICD-10-CM | POA: Diagnosis not present

## 2019-04-04 DIAGNOSIS — J302 Other seasonal allergic rhinitis: Secondary | ICD-10-CM | POA: Diagnosis not present

## 2019-04-04 DIAGNOSIS — I10 Essential (primary) hypertension: Secondary | ICD-10-CM

## 2019-04-04 DIAGNOSIS — E559 Vitamin D deficiency, unspecified: Secondary | ICD-10-CM | POA: Insufficient documentation

## 2019-04-04 DIAGNOSIS — Z0000 Encounter for general adult medical examination without abnormal findings: Secondary | ICD-10-CM

## 2019-04-04 DIAGNOSIS — J301 Allergic rhinitis due to pollen: Secondary | ICD-10-CM | POA: Diagnosis not present

## 2019-04-04 LAB — CBC WITH DIFFERENTIAL/PLATELET
Basophils Absolute: 0 10*3/uL (ref 0.0–0.1)
Basophils Relative: 0.4 % (ref 0.0–3.0)
Eosinophils Absolute: 0.2 10*3/uL (ref 0.0–0.7)
Eosinophils Relative: 3.9 % (ref 0.0–5.0)
HCT: 33.4 % — ABNORMAL LOW (ref 36.0–46.0)
Hemoglobin: 11.3 g/dL — ABNORMAL LOW (ref 12.0–15.0)
Lymphocytes Relative: 28.8 % (ref 12.0–46.0)
Lymphs Abs: 1.4 10*3/uL (ref 0.7–4.0)
MCHC: 33.8 g/dL (ref 30.0–36.0)
MCV: 87.2 fl (ref 78.0–100.0)
Monocytes Absolute: 0.4 10*3/uL (ref 0.1–1.0)
Monocytes Relative: 7.9 % (ref 3.0–12.0)
Neutro Abs: 2.9 10*3/uL (ref 1.4–7.7)
Neutrophils Relative %: 59 % (ref 43.0–77.0)
Platelets: 296 10*3/uL (ref 150.0–400.0)
RBC: 3.83 Mil/uL — ABNORMAL LOW (ref 3.87–5.11)
RDW: 14.8 % (ref 11.5–15.5)
WBC: 4.9 10*3/uL (ref 4.0–10.5)

## 2019-04-04 LAB — LIPID PANEL
Cholesterol: 214 mg/dL — ABNORMAL HIGH (ref 0–200)
HDL: 47.9 mg/dL (ref >39.00)
LDL Cholesterol: 140 mg/dL — ABNORMAL HIGH (ref 0–99)
NonHDL: 166.22
Total CHOL/HDL Ratio: 4
Triglycerides: 130 mg/dL (ref 0.0–149.0)
VLDL: 26 mg/dL (ref 0.0–40.0)

## 2019-04-04 LAB — TSH: TSH: 1.88 u[IU]/mL (ref 0.35–4.50)

## 2019-04-04 LAB — VITAMIN D 25 HYDROXY (VIT D DEFICIENCY, FRACTURES): VITD: 20.32 ng/mL — ABNORMAL LOW (ref 30.00–100.00)

## 2019-04-04 LAB — COMPREHENSIVE METABOLIC PANEL
ALT: 12 U/L (ref 0–35)
AST: 13 U/L (ref 0–37)
Albumin: 3.9 g/dL (ref 3.5–5.2)
Alkaline Phosphatase: 77 U/L (ref 39–117)
BUN: 11 mg/dL (ref 6–23)
CO2: 28 mEq/L (ref 19–32)
Calcium: 9.3 mg/dL (ref 8.4–10.5)
Chloride: 104 mEq/L (ref 96–112)
Creatinine, Ser: 0.88 mg/dL (ref 0.40–1.20)
GFR: 81.63 mL/min (ref >60.00)
Glucose, Bld: 86 mg/dL (ref 70–99)
Potassium: 4.2 mEq/L (ref 3.5–5.1)
Sodium: 138 mEq/L (ref 135–145)
Total Bilirubin: 0.3 mg/dL (ref 0.2–1.2)
Total Protein: 7.3 g/dL (ref 6.0–8.3)

## 2019-04-04 LAB — VITAMIN B12: Vitamin B-12: 195 pg/mL — ABNORMAL LOW (ref 211–911)

## 2019-04-04 LAB — HEMOGLOBIN A1C: Hgb A1c MFr Bld: 5.4 % (ref 4.6–6.5)

## 2019-04-04 MED ORDER — VITAMIN D (ERGOCALCIFEROL) 1.25 MG (50000 UNIT) PO CAPS: 50000.0000 [IU] | ORAL_CAPSULE | ORAL | 0 refills | Status: AC

## 2019-04-04 NOTE — Progress Notes (Signed)
Established Patient Office Visit     CC/Reason for Visit: Annual preventive exam  HPI: Catherine Campbell is a 53 y.o. female who is coming in today for the above mentioned reasons. Past Medical History is significant for: Past Medical History is significant for: Hypertension that has been well controlled on losartan, she needs refills today, she also has seasonal allergies and allergic rhinitis and is followed by the allergy center for this.  History is also significant for hyperlipidemia, however she does not take statins.  She also has a history of chronic asthma that has been well controlled on Symbicort and albuterol but does not follow with this office for this.  She has no acute complaints today.  She has routine eye care but needs a dentist.  She has GYN care through Dr. Phineas Real who is following her for some abnormal Pap smears.  She had a colonoscopy in 2019.  She will receive tetanus and her first shingles vaccination today.   Past Medical/Surgical History: Past Medical History:  Diagnosis Date   Allergy    ASCUS with positive high risk human papillomavirus of vagina 06/2013, 12/2016   Colposcopic biopsy koilocytotic atypia.   Asthma    Hypertension    Low grade squamous intraepithelial lesion (LGSIL) 06/2014, 06/2015, 06/2016, 06/2017, 12/2017, 07/2018   positive high-risk HPV. Colposcopic biopsy LGSIL negative [facility] 2015,  [facility] 2017 with koilocytotic atypia changes    Past Surgical History:  Procedure Laterality Date   BOIL UNDER ARM      Social History:  reports that she has never smoked. She has never used smokeless tobacco. She reports that she does not drink alcohol or use drugs.  Allergies: Allergies  Allergen Reactions   Metaxalone Hives   Penicillins Hives   Tramadol-Acetaminophen Hives    Family History:  Family History  Problem Relation Age of Onset   Diabetes Father    Hypertension Father    Colon polyps Father    Hypertension Mother      Cancer Mother        Pancreatic   Colon polyps Mother    Rectal cancer Neg Hx    Stomach cancer Neg Hx    Colon cancer Neg Hx      Current Outpatient Medications:    albuterol (PROAIR HFA) 108 (90 Base) MCG/ACT inhaler, , Disp: , Rfl:    azelastine (ASTELIN) 0.1 % nasal spray, INSTILL 1 PUFF IN EACH NOSTRIL TWICE A DAY NASALLY 30 DAYS, Disp: , Rfl: 3   cetirizine (ZYRTEC) 10 MG tablet, Take 10 mg by mouth daily., Disp: , Rfl:    EPIPEN 2-PAK 0.3 MG/0.3ML SOAJ injection, Reported on 09/24/2015, Disp: , Rfl: 0   fexofenadine (ALLEGRA) 180 MG tablet, Take 180 mg by mouth daily.  , Disp: , Rfl:    fluticasone (FLONASE) 50 MCG/ACT nasal spray, INSTILL 2 SPRAYS IN EACH NOSTRIL ONCE A DAY NASALLY 30 DAYS, Disp: , Rfl: 3   ibuprofen (ADVIL,MOTRIN) 200 MG tablet, Take by mouth., Disp: , Rfl:    Influenza vac split quadrivalent PF (AFLURIA QUADRIVALENT) 0.5 ML injection, ADM 0.5ML IM UTD, Disp: , Rfl:    losartan (COZAAR) 100 MG tablet, TAKE 1 TABLET BY MOUTH EVERY DAY, Disp: 90 tablet, Rfl: 1   montelukast (SINGULAIR) 10 MG tablet, Take 10 mg by mouth at bedtime. , Disp: , Rfl:    nystatin-triamcinolone (MYCOLOG II) cream, Apply 1 application topically 2 (two) times daily., Disp: 30 g, Rfl: 0   RESTASIS  0.05 % ophthalmic emulsion, INSTILL 1 DROP INTO BOTH EYES TWICE A DAY, Disp: , Rfl: 3   SYMBICORT 80-4.5 MCG/ACT inhaler, , Disp: , Rfl:    UNABLE TO FIND, Allergy shots once a week, Disp: , Rfl:   Current Facility-Administered Medications:    0.9 %  sodium chloride infusion, 500 mL, Intravenous, Once, Danis, Estill Cotta III, MD  Review of Systems:  Constitutional: Denies fever, chills, diaphoresis, appetite change and fatigue.  HEENT: Denies photophobia, eye pain, redness, hearing loss, ear pain, congestion, sore throat, rhinorrhea, sneezing, mouth sores, trouble swallowing, neck pain, neck stiffness and tinnitus.   Respiratory: Denies SOB, DOE, cough, chest tightness,  and  wheezing.   Cardiovascular: Denies chest pain, palpitations and leg swelling.  Gastrointestinal: Denies nausea, vomiting, abdominal pain, diarrhea, constipation, blood in stool and abdominal distention.  Genitourinary: Denies dysuria, urgency, frequency, hematuria, flank pain and difficulty urinating.  Endocrine: Denies: hot or cold intolerance, sweats, changes in hair or nails, polyuria, polydipsia. Musculoskeletal: Denies myalgias, back pain, joint swelling, arthralgias and gait problem.  Skin: Denies pallor, rash and wound.  Neurological: Denies dizziness, seizures, syncope, weakness, light-headedness, numbness and headaches.  Hematological: Denies adenopathy. Easy bruising, personal or family bleeding history  Psychiatric/Behavioral: Denies suicidal ideation, mood changes, confusion, nervousness, sleep disturbance and agitation    Physical Exam: Vitals:   04/04/19 0802  BP: 110/80  Pulse: 98  Temp: 97.6 F (36.4 C)  TempSrc: Temporal  SpO2: 97%  Weight: 242 lb 9.6 oz (110 kg)  Height: 5' 5"  (1.651 m)    Body mass index is 40.37 kg/m.   Constitutional: NAD, calm, comfortable, obese Eyes: PERRL, lids and conjunctivae normal, wears corrective lenses ENMT: Mucous membranes are moist. Tympanic membrane is pearly white, no erythema or bulging. Neck: normal, supple, no masses, no thyromegaly Respiratory: clear to auscultation bilaterally, no wheezing, no crackles. Normal respiratory effort. No accessory muscle use.  Cardiovascular: Regular rate and rhythm, no murmurs / rubs / gallops. No extremity edema. 2+ pedal pulses. No carotid bruits.  Abdomen: no tenderness, no masses palpated. No hepatosplenomegaly. Bowel sounds positive.  Musculoskeletal: no clubbing / cyanosis. No joint deformity upper and lower extremities. Good ROM, no contractures. Normal muscle tone.  Skin: no rashes, lesions, ulcers. No induration Neurologic: CN 2-12 grossly intact. Sensation intact, DTR normal.  Strength 5/5 in all 4.  Psychiatric: Normal judgment and insight. Alert and oriented x 3. Normal mood.    Impression and Plan:  Encounter for preventive health examination -Has routine eye care. -Have recommended routine dental care. -Due for tetanus and first shingles which she will receive today, otherwise immunizations are up-to-date. -Healthy lifestyle has been discussed in detail. -Screening labs to be performed today. -Had a colonoscopy in 2019 and she is a 10-year recall. -Had a negative mammogram in March. -Follows with GYN for history of abnormal Pap smears, Dr. Phineas Real, last in December 2019.  Hyperlipidemia, unspecified hyperlipidemia type  -Last LDL was 143 in August 2019, not on statins  Essential hypertension -Well-controlled stop  Seasonal allergies Mild intermittent chronic asthma without complication -Followed by allergist, is on Symbicort, Singulair, Astelin nasal spray, also gets asthma shots.  Takes an antihistamine daily.  Morbid obesity ([employer]) -Discussed healthy lifestyle, including increased physical activity and better food choices to promote weight loss.    Patient Instructions  -Nice seeing you today!!  -Lab work today; will notify you once results are available.  -Tetanus and 1 of 2 shingles vaccines today.  -Make sure you obtain routine  dental care.  -Schedule follow up in 4 months.   Preventive Care 48-64 Years Old, Female Preventive care refers to visits with your health care provider and lifestyle choices that can promote health and wellness. This includes:  A yearly physical exam. This may also be called an annual well check.  Regular dental visits and eye exams.  Immunizations.  Screening for certain conditions.  Healthy lifestyle choices, such as eating a healthy diet, getting regular exercise, not using drugs or products that contain nicotine and tobacco, and limiting alcohol use. What can I expect for my preventive care  visit? Physical exam Your health care provider will check your:  Height and weight. This may be used to calculate body mass index (BMI), which tells if you are at a healthy weight.  Heart rate and blood pressure.  Skin for abnormal spots. Counseling Your health care provider may ask you questions about your:  Alcohol, tobacco, and drug use.  Emotional well-being.  Home and relationship well-being.  Sexual activity.  Eating habits.  Work and work Statistician.  Method of birth control.  Menstrual cycle.  Pregnancy history. What immunizations do I need?  Influenza (flu) vaccine  This is recommended every year. Tetanus, diphtheria, and pertussis (Tdap) vaccine  You may need a Td booster every 10 years. Varicella (chickenpox) vaccine  You may need this if you have not been vaccinated. Zoster (shingles) vaccine  You may need this after age 25. Measles, mumps, and rubella (MMR) vaccine  You may need at least one dose of MMR if you were born in 1957 or later. You may also need a second dose. Pneumococcal conjugate (PCV13) vaccine  You may need this if you have certain conditions and were not previously vaccinated. Pneumococcal polysaccharide (PPSV23) vaccine  You may need one or two doses if you smoke cigarettes or if you have certain conditions. Meningococcal conjugate (MenACWY) vaccine  You may need this if you have certain conditions. Hepatitis A vaccine  You may need this if you have certain conditions or if you travel or work in places where you may be exposed to hepatitis A. Hepatitis B vaccine  You may need this if you have certain conditions or if you travel or work in places where you may be exposed to hepatitis B. Haemophilus influenzae type b (Hib) vaccine  You may need this if you have certain conditions. Human papillomavirus (HPV) vaccine  If recommended by your health care provider, you may need three doses over 6 months. You may receive  vaccines as individual doses or as more than one vaccine together in one shot (combination vaccines). Talk with your health care provider about the risks and benefits of combination vaccines. What tests do I need? Blood tests  Lipid and cholesterol levels. These may be checked every 5 years, or more frequently if you are over 14 years old.  Hepatitis C test.  Hepatitis B test. Screening  Lung cancer screening. You may have this screening every year starting at age 68 if you have a 30-pack-year history of smoking and currently smoke or have quit within the past 15 years.  Colorectal cancer screening. All adults should have this screening starting at age 42 and continuing until age 79. Your health care provider may recommend screening at age 39 if you are at increased risk. You will have tests every 1-10 years, depending on your results and the type of screening test.  Diabetes screening. This is done by checking your blood sugar (glucose) after  you have not eaten for a while (fasting). You may have this done every 1-3 years.  Mammogram. This may be done every 1-2 years. Talk with your health care provider about when you should start having regular mammograms. This may depend on whether you have a family history of breast cancer.  BRCA-related cancer screening. This may be done if you have a family history of breast, ovarian, tubal, or peritoneal cancers.  Pelvic exam and Pap test. This may be done every 3 years starting at age 37. Starting at age 42, this may be done every 5 years if you have a Pap test in combination with an HPV test. Other tests  Sexually transmitted disease (STD) testing.  Bone density scan. This is done to screen for osteoporosis. You may have this scan if you are at high risk for osteoporosis. Follow these instructions at home: Eating and drinking  Eat a diet that includes fresh fruits and vegetables, whole grains, lean protein, and low-fat dairy.  Take vitamin  and mineral supplements as recommended by your health care provider.  Do not drink alcohol if: ? Your health care provider tells you not to drink. ? You are pregnant, may be pregnant, or are planning to become pregnant.  If you drink alcohol: ? Limit how much you have to 0-1 drink a day. ? Be aware of how much alcohol is in your drink. In the [place] one drink equals one 12 oz bottle of beer (355 mL), one 5 oz glass of wine (148 mL), or one 1 oz glass of hard liquor (44 mL). Lifestyle  Take daily care of your teeth and gums.  Stay active. Exercise for at least 30 minutes on 5 or more days each week.  Do not use any products that contain nicotine or tobacco, such as cigarettes, e-cigarettes, and chewing tobacco. If you need help quitting, ask your health care provider.  If you are sexually active, practice safe sex. Use a condom or other form of birth control (contraception) in order to prevent pregnancy and STIs (sexually transmitted infections).  If told by your health care provider, take low-dose aspirin daily starting at age 54. What's next?  Visit your health care provider once a year for a well check visit.  Ask your health care provider how often you should have your eyes and teeth checked.  Stay up to date on all vaccines. This information is not intended to replace advice given to you by your health care provider. Make sure you discuss any questions you have with your health care provider. Document Released: 09/06/2015 Document Revised: 04/21/2018 Document Reviewed: 04/21/2018 Elsevier Patient Education  2020 [place], MD [facility]

## 2019-04-04 NOTE — Addendum Note (Signed)
Addended by: Westley Hummer B on: 04/04/2019 09:36 AM   Modules accepted: Orders

## 2019-04-04 NOTE — Addendum Note (Signed)
Addended by: Elmer Picker on: 04/04/2019 08:50 AM   Modules accepted: Orders

## 2019-04-04 NOTE — Patient Instructions (Signed)
-Nice seeing you today!!  -Lab work today; will notify you once results are available.  -Tetanus and 1 of 2 shingles vaccines today.  -Make sure you obtain routine dental care.  -Schedule follow up in 4 months.   Preventive Care 65-52 Years Old, Female Preventive care refers to visits with your health care provider and lifestyle choices that can promote health and wellness. This includes:  A yearly physical exam. This may also be called an annual well check.  Regular dental visits and eye exams.  Immunizations.  Screening for certain conditions.  Healthy lifestyle choices, such as eating a healthy diet, getting regular exercise, not using drugs or products that contain nicotine and tobacco, and limiting alcohol use. What can I expect for my preventive care visit? Physical exam Your health care provider will check your:  Height and weight. This may be used to calculate body mass index (BMI), which tells if you are at a healthy weight.  Heart rate and blood pressure.  Skin for abnormal spots. Counseling Your health care provider may ask you questions about your:  Alcohol, tobacco, and drug use.  Emotional well-being.  Home and relationship well-being.  Sexual activity.  Eating habits.  Work and work Statistician.  Method of birth control.  Menstrual cycle.  Pregnancy history. What immunizations do I need?  Influenza (flu) vaccine  This is recommended every year. Tetanus, diphtheria, and pertussis (Tdap) vaccine  You may need a Td booster every 10 years. Varicella (chickenpox) vaccine  You may need this if you have not been vaccinated. Zoster (shingles) vaccine  You may need this after age 8. Measles, mumps, and rubella (MMR) vaccine  You may need at least one dose of MMR if you were born in 1957 or later. You may also need a second dose. Pneumococcal conjugate (PCV13) vaccine  You may need this if you have certain conditions and were not previously  vaccinated. Pneumococcal polysaccharide (PPSV23) vaccine  You may need one or two doses if you smoke cigarettes or if you have certain conditions. Meningococcal conjugate (MenACWY) vaccine  You may need this if you have certain conditions. Hepatitis A vaccine  You may need this if you have certain conditions or if you travel or work in places where you may be exposed to hepatitis A. Hepatitis B vaccine  You may need this if you have certain conditions or if you travel or work in places where you may be exposed to hepatitis B. Haemophilus influenzae type b (Hib) vaccine  You may need this if you have certain conditions. Human papillomavirus (HPV) vaccine  If recommended by your health care provider, you may need three doses over 6 months. You may receive vaccines as individual doses or as more than one vaccine together in one shot (combination vaccines). Talk with your health care provider about the risks and benefits of combination vaccines. What tests do I need? Blood tests  Lipid and cholesterol levels. These may be checked every 5 years, or more frequently if you are over 24 years old.  Hepatitis C test.  Hepatitis B test. Screening  Lung cancer screening. You may have this screening every year starting at age 64 if you have a 30-pack-year history of smoking and currently smoke or have quit within the past 15 years.  Colorectal cancer screening. All adults should have this screening starting at age 75 and continuing until age 4. Your health care provider may recommend screening at age 90 if you are at increased risk. You  will have tests every 1-10 years, depending on your results and the type of screening test.  Diabetes screening. This is done by checking your blood sugar (glucose) after you have not eaten for a while (fasting). You may have this done every 1-3 years.  Mammogram. This may be done every 1-2 years. Talk with your health care provider about when you should start  having regular mammograms. This may depend on whether you have a family history of breast cancer.  BRCA-related cancer screening. This may be done if you have a family history of breast, ovarian, tubal, or peritoneal cancers.  Pelvic exam and Pap test. This may be done every 3 years starting at age 70. Starting at age 110, this may be done every 5 years if you have a Pap test in combination with an HPV test. Other tests  Sexually transmitted disease (STD) testing.  Bone density scan. This is done to screen for osteoporosis. You may have this scan if you are at high risk for osteoporosis. Follow these instructions at home: Eating and drinking  Eat a diet that includes fresh fruits and vegetables, whole grains, lean protein, and low-fat dairy.  Take vitamin and mineral supplements as recommended by your health care provider.  Do not drink alcohol if: ? Your health care provider tells you not to drink. ? You are pregnant, may be pregnant, or are planning to become pregnant.  If you drink alcohol: ? Limit how much you have to 0-1 drink a day. ? Be aware of how much alcohol is in your drink. In the [place] one drink equals one 12 oz bottle of beer (355 mL), one 5 oz glass of wine (148 mL), or one 1 oz glass of hard liquor (44 mL). Lifestyle  Take daily care of your teeth and gums.  Stay active. Exercise for at least 30 minutes on 5 or more days each week.  Do not use any products that contain nicotine or tobacco, such as cigarettes, e-cigarettes, and chewing tobacco. If you need help quitting, ask your health care provider.  If you are sexually active, practice safe sex. Use a condom or other form of birth control (contraception) in order to prevent pregnancy and STIs (sexually transmitted infections).  If told by your health care provider, take low-dose aspirin daily starting at age 7. What's next?  Visit your health care provider once a year for a well check visit.  Ask your health  care provider how often you should have your eyes and teeth checked.  Stay up to date on all vaccines. This information is not intended to replace advice given to you by your health care provider. Make sure you discuss any questions you have with your health care provider. Document Released: 09/06/2015 Document Revised: 04/21/2018 Document Reviewed: 04/21/2018 Elsevier Patient Education  2020 [employer].

## 2019-04-10 ENCOUNTER — Other Ambulatory Visit: Payer: Self-pay

## 2019-04-10 ENCOUNTER — Ambulatory Visit (INDEPENDENT_AMBULATORY_CARE_PROVIDER_SITE_OTHER): Payer: Federal, State, Local not specified - PPO | Admitting: *Deleted

## 2019-04-10 DIAGNOSIS — J301 Allergic rhinitis due to pollen: Secondary | ICD-10-CM | POA: Diagnosis not present

## 2019-04-10 DIAGNOSIS — E538 Deficiency of other specified B group vitamins: Secondary | ICD-10-CM

## 2019-04-10 DIAGNOSIS — J3089 Other allergic rhinitis: Secondary | ICD-10-CM | POA: Diagnosis not present

## 2019-04-10 MED ORDER — CYANOCOBALAMIN 1000 MCG/ML IJ SOLN
1000.0000 ug | Freq: Once | INTRAMUSCULAR | Status: AC
  Administered 2019-04-10: 12:00:00 1000 ug via INTRAMUSCULAR

## 2019-04-10 NOTE — Progress Notes (Signed)
Patient in office for B-12 injection. B-12 injection administered with no adverse reaction. Patient needs to come on Tuesday or Thursday afternoon about 4pm. Ok'd by Clinic nurse supervisor.

## 2019-04-18 ENCOUNTER — Other Ambulatory Visit: Payer: Self-pay

## 2019-04-18 ENCOUNTER — Telehealth: Payer: Self-pay | Admitting: Internal Medicine

## 2019-04-18 ENCOUNTER — Ambulatory Visit (INDEPENDENT_AMBULATORY_CARE_PROVIDER_SITE_OTHER): Payer: Federal, State, Local not specified - PPO | Admitting: *Deleted

## 2019-04-18 DIAGNOSIS — J301 Allergic rhinitis due to pollen: Secondary | ICD-10-CM | POA: Diagnosis not present

## 2019-04-18 DIAGNOSIS — E538 Deficiency of other specified B group vitamins: Secondary | ICD-10-CM | POA: Diagnosis not present

## 2019-04-18 DIAGNOSIS — J3089 Other allergic rhinitis: Secondary | ICD-10-CM | POA: Diagnosis not present

## 2019-04-18 MED ORDER — CYANOCOBALAMIN 1000 MCG/ML IJ SOLN
1000.0000 ug | Freq: Once | INTRAMUSCULAR | Status: AC
  Administered 2019-04-18: 16:00:00 1000 ug via INTRAMUSCULAR

## 2019-04-18 NOTE — Telephone Encounter (Signed)
Patient needs a B12 scheduled.  She states she spoke to [facility] and it was ok'd to have her B12 injections done on Tuesday afternoons at 4:15.

## 2019-04-18 NOTE — Progress Notes (Signed)
Per orders of Dr. Hernandez, injection of B12 given by Chyanne Kohut. Patient tolerated injection well.  

## 2019-04-19 NOTE — Telephone Encounter (Signed)
Called patient to schedule next NV for injection. No answer. LVM for patient to return call.

## 2019-04-20 NOTE — Telephone Encounter (Signed)
Patient returning call from [place]. Please advise.

## 2019-04-20 NOTE — Telephone Encounter (Signed)
Next 3 B-12 injections scheduled for patient

## 2019-04-23 ENCOUNTER — Other Ambulatory Visit: Payer: Self-pay | Admitting: Gynecology

## 2019-04-25 ENCOUNTER — Other Ambulatory Visit: Payer: Self-pay

## 2019-04-25 ENCOUNTER — Ambulatory Visit (INDEPENDENT_AMBULATORY_CARE_PROVIDER_SITE_OTHER): Payer: Federal, State, Local not specified - PPO | Admitting: *Deleted

## 2019-04-25 DIAGNOSIS — E538 Deficiency of other specified B group vitamins: Secondary | ICD-10-CM

## 2019-04-25 DIAGNOSIS — J3089 Other allergic rhinitis: Secondary | ICD-10-CM | POA: Diagnosis not present

## 2019-04-25 DIAGNOSIS — J301 Allergic rhinitis due to pollen: Secondary | ICD-10-CM | POA: Diagnosis not present

## 2019-04-25 MED ORDER — CYANOCOBALAMIN 1000 MCG/ML IJ SOLN
1000.0000 ug | Freq: Once | INTRAMUSCULAR | Status: AC
  Administered 2019-04-25: 1000 ug via INTRAMUSCULAR

## 2019-04-25 NOTE — Progress Notes (Signed)
Per orders of Dr. Hernandez, injection of B12 given by Desarae Placide. Patient tolerated injection well.  

## 2019-05-02 ENCOUNTER — Ambulatory Visit (INDEPENDENT_AMBULATORY_CARE_PROVIDER_SITE_OTHER): Payer: Federal, State, Local not specified - PPO | Admitting: *Deleted

## 2019-05-02 ENCOUNTER — Other Ambulatory Visit: Payer: Self-pay

## 2019-05-02 DIAGNOSIS — J301 Allergic rhinitis due to pollen: Secondary | ICD-10-CM | POA: Diagnosis not present

## 2019-05-02 DIAGNOSIS — Z23 Encounter for immunization: Secondary | ICD-10-CM | POA: Diagnosis not present

## 2019-05-02 DIAGNOSIS — E538 Deficiency of other specified B group vitamins: Secondary | ICD-10-CM | POA: Diagnosis not present

## 2019-05-02 DIAGNOSIS — J3089 Other allergic rhinitis: Secondary | ICD-10-CM | POA: Diagnosis not present

## 2019-05-02 MED ORDER — CYANOCOBALAMIN 1000 MCG/ML IJ SOLN
1000.0000 ug | Freq: Once | INTRAMUSCULAR | Status: AC
  Administered 2019-05-02: 1000 ug via INTRAMUSCULAR

## 2019-05-02 NOTE — Progress Notes (Signed)
Per orders of Dr. Hernandez, injection of B12 given by Dailyn Reith. Patient tolerated injection well.  

## 2019-05-09 ENCOUNTER — Ambulatory Visit (INDEPENDENT_AMBULATORY_CARE_PROVIDER_SITE_OTHER): Payer: Federal, State, Local not specified - PPO | Admitting: *Deleted

## 2019-05-09 ENCOUNTER — Other Ambulatory Visit: Payer: Self-pay

## 2019-05-09 DIAGNOSIS — J3089 Other allergic rhinitis: Secondary | ICD-10-CM | POA: Diagnosis not present

## 2019-05-09 DIAGNOSIS — E538 Deficiency of other specified B group vitamins: Secondary | ICD-10-CM | POA: Diagnosis not present

## 2019-05-09 DIAGNOSIS — J301 Allergic rhinitis due to pollen: Secondary | ICD-10-CM | POA: Diagnosis not present

## 2019-05-09 MED ORDER — CYANOCOBALAMIN 1000 MCG/ML IJ SOLN
1000.0000 ug | Freq: Once | INTRAMUSCULAR | Status: AC
  Administered 2019-05-09: 1000 ug via INTRAMUSCULAR

## 2019-05-09 NOTE — Progress Notes (Signed)
Per orders of Cory Nafziger NP, injection of B12 given by Shley Dolby. Patient tolerated injection well. 

## 2019-05-10 NOTE — Progress Notes (Signed)
Please sign

## 2019-05-17 ENCOUNTER — Encounter: Payer: Self-pay | Admitting: Gynecology

## 2019-05-17 DIAGNOSIS — J301 Allergic rhinitis due to pollen: Secondary | ICD-10-CM | POA: Diagnosis not present

## 2019-05-17 DIAGNOSIS — J3089 Other allergic rhinitis: Secondary | ICD-10-CM | POA: Diagnosis not present

## 2019-05-18 DIAGNOSIS — J301 Allergic rhinitis due to pollen: Secondary | ICD-10-CM | POA: Diagnosis not present

## 2019-05-19 DIAGNOSIS — J3089 Other allergic rhinitis: Secondary | ICD-10-CM | POA: Diagnosis not present

## 2019-05-26 DIAGNOSIS — J301 Allergic rhinitis due to pollen: Secondary | ICD-10-CM | POA: Diagnosis not present

## 2019-05-26 DIAGNOSIS — J3089 Other allergic rhinitis: Secondary | ICD-10-CM | POA: Diagnosis not present

## 2019-05-31 DIAGNOSIS — J3089 Other allergic rhinitis: Secondary | ICD-10-CM | POA: Diagnosis not present

## 2019-05-31 DIAGNOSIS — J301 Allergic rhinitis due to pollen: Secondary | ICD-10-CM | POA: Diagnosis not present

## 2019-06-06 ENCOUNTER — Ambulatory Visit (INDEPENDENT_AMBULATORY_CARE_PROVIDER_SITE_OTHER): Payer: Federal, State, Local not specified - PPO | Admitting: *Deleted

## 2019-06-06 ENCOUNTER — Other Ambulatory Visit: Payer: Self-pay

## 2019-06-06 DIAGNOSIS — E538 Deficiency of other specified B group vitamins: Secondary | ICD-10-CM

## 2019-06-06 DIAGNOSIS — J3089 Other allergic rhinitis: Secondary | ICD-10-CM | POA: Diagnosis not present

## 2019-06-06 DIAGNOSIS — J301 Allergic rhinitis due to pollen: Secondary | ICD-10-CM | POA: Diagnosis not present

## 2019-06-06 MED ORDER — CYANOCOBALAMIN 1000 MCG/ML IJ SOLN
1000.0000 ug | Freq: Once | INTRAMUSCULAR | Status: AC
  Administered 2019-06-06: 1000 ug via INTRAMUSCULAR

## 2019-06-06 NOTE — Progress Notes (Signed)
Per orders of Dr. Koberlein, injection of Cyanocobalamin 1000mcg given by Joie Reamer A. Patient tolerated injection well. 

## 2019-06-09 DIAGNOSIS — J3089 Other allergic rhinitis: Secondary | ICD-10-CM | POA: Diagnosis not present

## 2019-06-09 DIAGNOSIS — J301 Allergic rhinitis due to pollen: Secondary | ICD-10-CM | POA: Diagnosis not present

## 2019-06-12 DIAGNOSIS — J3089 Other allergic rhinitis: Secondary | ICD-10-CM | POA: Diagnosis not present

## 2019-06-12 DIAGNOSIS — J301 Allergic rhinitis due to pollen: Secondary | ICD-10-CM | POA: Diagnosis not present

## 2019-06-16 DIAGNOSIS — J3089 Other allergic rhinitis: Secondary | ICD-10-CM | POA: Diagnosis not present

## 2019-06-16 DIAGNOSIS — J301 Allergic rhinitis due to pollen: Secondary | ICD-10-CM | POA: Diagnosis not present

## 2019-06-21 ENCOUNTER — Other Ambulatory Visit: Payer: Self-pay | Admitting: Internal Medicine

## 2019-06-21 DIAGNOSIS — E559 Vitamin D deficiency, unspecified: Secondary | ICD-10-CM

## 2019-06-21 DIAGNOSIS — J3089 Other allergic rhinitis: Secondary | ICD-10-CM | POA: Diagnosis not present

## 2019-06-21 DIAGNOSIS — J301 Allergic rhinitis due to pollen: Secondary | ICD-10-CM | POA: Diagnosis not present

## 2019-06-23 DIAGNOSIS — J4540 Moderate persistent asthma, uncomplicated: Secondary | ICD-10-CM | POA: Diagnosis not present

## 2019-06-23 DIAGNOSIS — H1045 Other chronic allergic conjunctivitis: Secondary | ICD-10-CM | POA: Diagnosis not present

## 2019-06-23 DIAGNOSIS — J3089 Other allergic rhinitis: Secondary | ICD-10-CM | POA: Diagnosis not present

## 2019-06-23 DIAGNOSIS — J301 Allergic rhinitis due to pollen: Secondary | ICD-10-CM | POA: Diagnosis not present

## 2019-06-28 DIAGNOSIS — J301 Allergic rhinitis due to pollen: Secondary | ICD-10-CM | POA: Diagnosis not present

## 2019-06-28 DIAGNOSIS — J3089 Other allergic rhinitis: Secondary | ICD-10-CM | POA: Diagnosis not present

## 2019-07-05 DIAGNOSIS — J3089 Other allergic rhinitis: Secondary | ICD-10-CM | POA: Diagnosis not present

## 2019-07-05 DIAGNOSIS — J301 Allergic rhinitis due to pollen: Secondary | ICD-10-CM | POA: Diagnosis not present

## 2019-07-11 ENCOUNTER — Ambulatory Visit (INDEPENDENT_AMBULATORY_CARE_PROVIDER_SITE_OTHER): Payer: Federal, State, Local not specified - PPO | Admitting: *Deleted

## 2019-07-11 ENCOUNTER — Other Ambulatory Visit: Payer: Self-pay

## 2019-07-11 DIAGNOSIS — J3089 Other allergic rhinitis: Secondary | ICD-10-CM | POA: Diagnosis not present

## 2019-07-11 DIAGNOSIS — J301 Allergic rhinitis due to pollen: Secondary | ICD-10-CM | POA: Diagnosis not present

## 2019-07-11 DIAGNOSIS — E538 Deficiency of other specified B group vitamins: Secondary | ICD-10-CM

## 2019-07-11 MED ORDER — CYANOCOBALAMIN 1000 MCG/ML IJ SOLN
1000.0000 ug | Freq: Once | INTRAMUSCULAR | Status: AC
  Administered 2019-07-11: 1000 ug via INTRAMUSCULAR

## 2019-07-11 NOTE — Progress Notes (Signed)
Per orders of Dr. Hernandez, injection of B12 given by Mykayla Brinton. Patient tolerated injection well.  

## 2019-07-19 DIAGNOSIS — J301 Allergic rhinitis due to pollen: Secondary | ICD-10-CM | POA: Diagnosis not present

## 2019-07-19 DIAGNOSIS — J3089 Other allergic rhinitis: Secondary | ICD-10-CM | POA: Diagnosis not present

## 2019-07-20 IMAGING — DX DG CHEST 2V
2 series · 2 of 2 positions shown · non-contrast
Comparison: 03/14/2014

CLINICAL DATA: RIGHT basilar crackles, URI symptoms, cough, non
recurrent acute suppurative otitis media LEFT ear without
spontaneous rupture of tympanic membrane

EXAM:
CHEST - 2 VIEW

[chest pa]
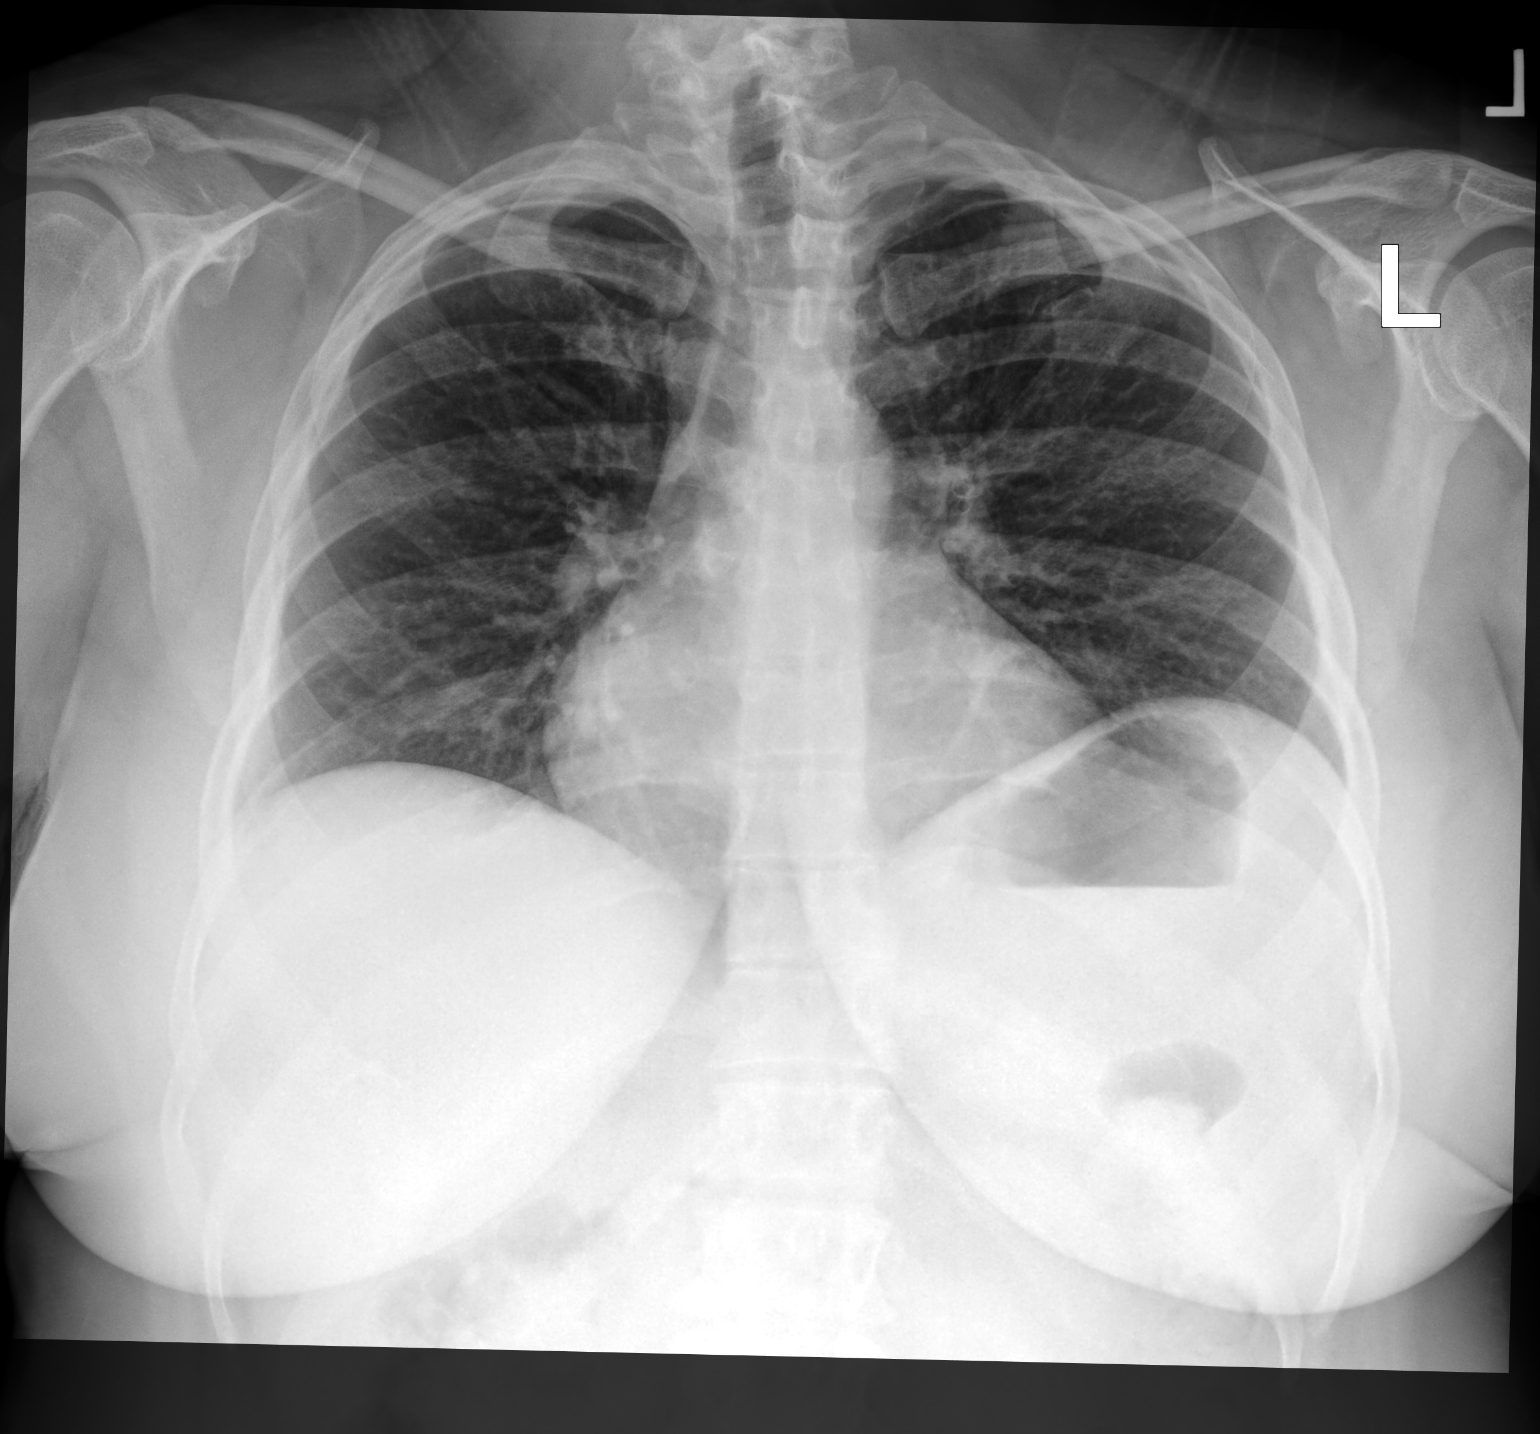

[chest lat]
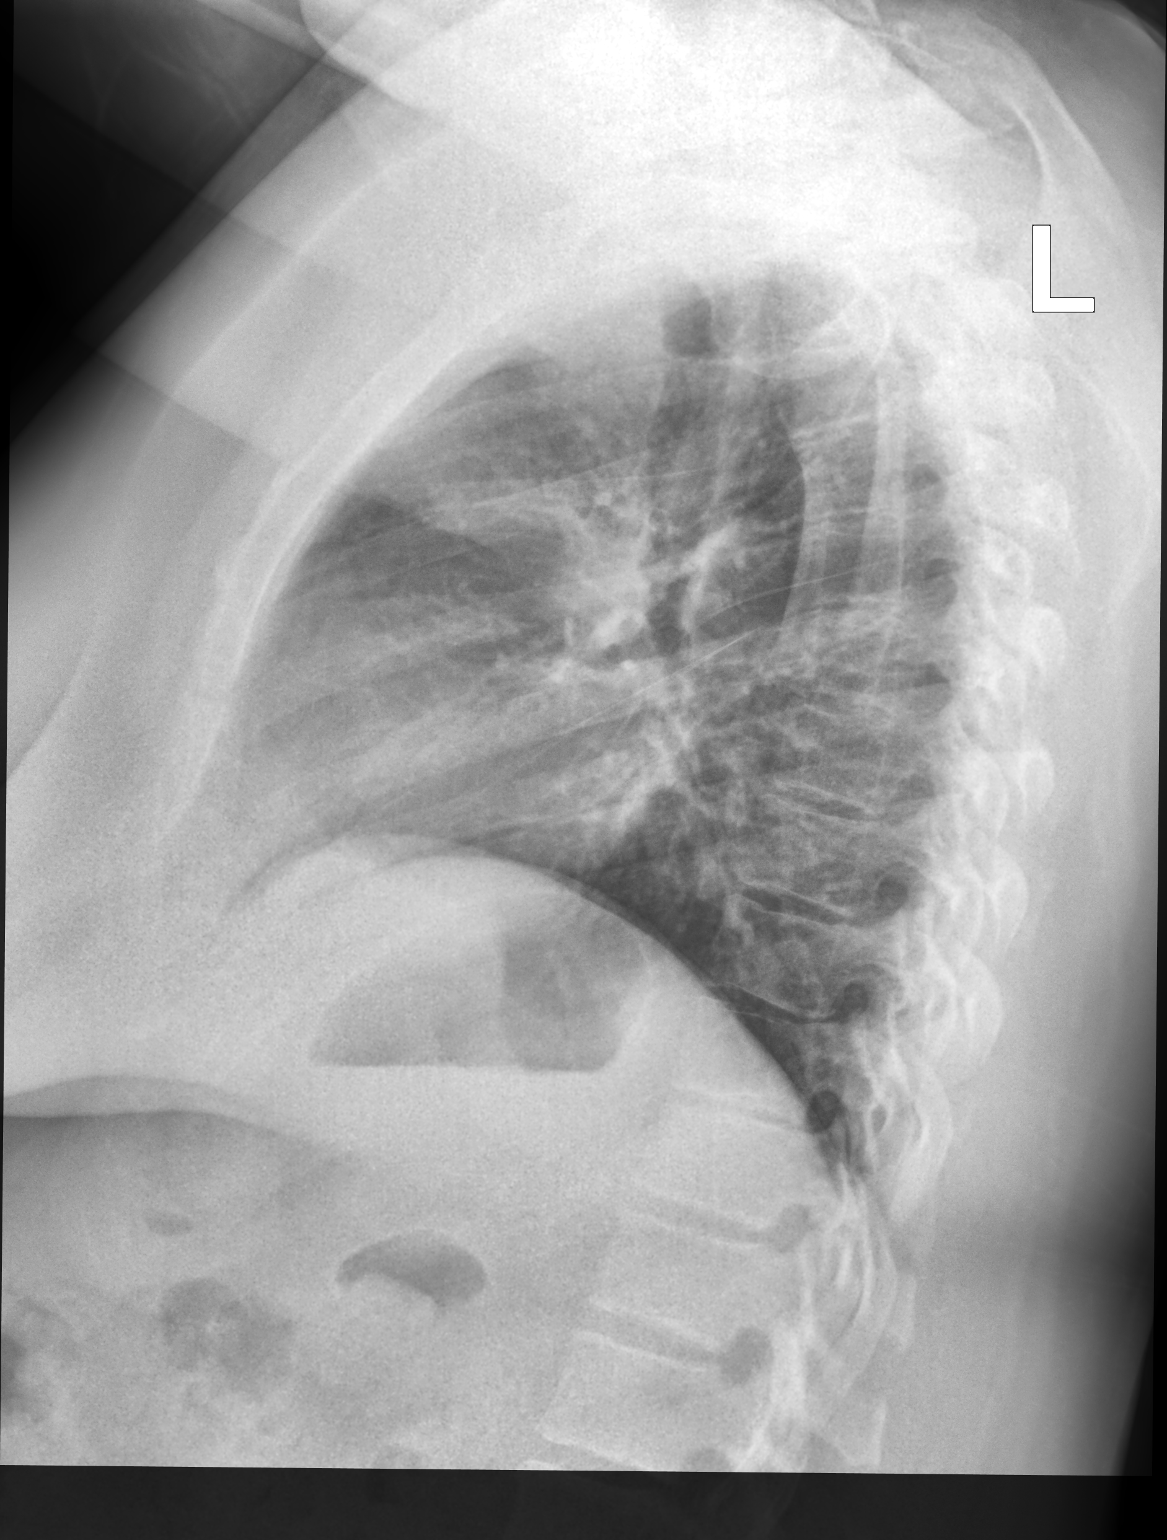

[2 of 2 positions shown; findings below may reference images not displayed]

FINDINGS: Upper normal heart size.

Mediastinal contours and pulmonary vascularity normal.

Slightly increased bronchitic changes versus prior study.

No infiltrate, pleural effusion or pneumothorax.

No acute osseous findings.
IMPRESSION: Bronchitic changes slightly increased from previous exam.

No acute abnormalities.

## 2019-07-27 DIAGNOSIS — J3089 Other allergic rhinitis: Secondary | ICD-10-CM | POA: Diagnosis not present

## 2019-07-27 DIAGNOSIS — J3081 Allergic rhinitis due to animal (cat) (dog) hair and dander: Secondary | ICD-10-CM | POA: Diagnosis not present

## 2019-07-27 DIAGNOSIS — J301 Allergic rhinitis due to pollen: Secondary | ICD-10-CM | POA: Diagnosis not present

## 2019-08-03 ENCOUNTER — Other Ambulatory Visit: Payer: Self-pay

## 2019-08-04 ENCOUNTER — Encounter: Payer: Self-pay | Admitting: Internal Medicine

## 2019-08-04 ENCOUNTER — Ambulatory Visit (INDEPENDENT_AMBULATORY_CARE_PROVIDER_SITE_OTHER): Payer: Federal, State, Local not specified - PPO | Admitting: Gynecology

## 2019-08-04 ENCOUNTER — Other Ambulatory Visit: Payer: Self-pay | Admitting: Internal Medicine

## 2019-08-04 ENCOUNTER — Ambulatory Visit: Payer: Federal, State, Local not specified - PPO | Admitting: Internal Medicine

## 2019-08-04 ENCOUNTER — Encounter: Payer: Self-pay | Admitting: Gynecology

## 2019-08-04 VITALS — BP 124/82 | Ht 65.0 in | Wt 241.0 lb

## 2019-08-04 VITALS — BP 126/74 | HR 97 | Temp 98.1°F | Wt 243.6 lb

## 2019-08-04 DIAGNOSIS — J302 Other seasonal allergic rhinitis: Secondary | ICD-10-CM

## 2019-08-04 DIAGNOSIS — J4520 Mild intermittent asthma, uncomplicated: Secondary | ICD-10-CM

## 2019-08-04 DIAGNOSIS — R21 Rash and other nonspecific skin eruption: Secondary | ICD-10-CM | POA: Diagnosis not present

## 2019-08-04 DIAGNOSIS — J301 Allergic rhinitis due to pollen: Secondary | ICD-10-CM | POA: Diagnosis not present

## 2019-08-04 DIAGNOSIS — E538 Deficiency of other specified B group vitamins: Secondary | ICD-10-CM | POA: Diagnosis not present

## 2019-08-04 DIAGNOSIS — J3089 Other allergic rhinitis: Secondary | ICD-10-CM | POA: Diagnosis not present

## 2019-08-04 DIAGNOSIS — E559 Vitamin D deficiency, unspecified: Secondary | ICD-10-CM | POA: Diagnosis not present

## 2019-08-04 DIAGNOSIS — I10 Essential (primary) hypertension: Secondary | ICD-10-CM

## 2019-08-04 DIAGNOSIS — Z01419 Encounter for gynecological examination (general) (routine) without abnormal findings: Secondary | ICD-10-CM

## 2019-08-04 DIAGNOSIS — E785 Hyperlipidemia, unspecified: Secondary | ICD-10-CM

## 2019-08-04 LAB — VITAMIN D 25 HYDROXY (VIT D DEFICIENCY, FRACTURES): VITD: 24.4 ng/mL — ABNORMAL LOW (ref 30.00–100.00)

## 2019-08-04 MED ORDER — CLOBETASOL PROPIONATE 0.05 % EX CREA: TOPICAL_CREAM | CUTANEOUS | 1 refills | Status: DC

## 2019-08-04 MED ORDER — VITAMIN D (ERGOCALCIFEROL) 1.25 MG (50000 UNIT) PO CAPS: 50000.0000 [IU] | ORAL_CAPSULE | ORAL | 0 refills | Status: AC

## 2019-08-04 MED ORDER — LOSARTAN POTASSIUM 100 MG PO TABS: ORAL_TABLET | ORAL | 1 refills | Status: DC

## 2019-08-04 MED ORDER — CYANOCOBALAMIN 1000 MCG/ML IJ SOLN
1000.0000 ug | Freq: Once | INTRAMUSCULAR | Status: AC
  Administered 2019-08-04: 1000 ug via INTRAMUSCULAR

## 2019-08-04 MED ORDER — FLUCONAZOLE 150 MG PO TABS: 150.0000 mg | ORAL_TABLET | Freq: Once | ORAL | 0 refills | Status: AC

## 2019-08-04 NOTE — Patient Instructions (Signed)
Take the Diflucan pills daily for 7 days.  If you are on cholesterol medication then stop this during that time.  Apply the prescribed cream once to twice daily as needed.  Follow-up if your symptoms persist.

## 2019-08-04 NOTE — Progress Notes (Signed)
Established Patient Office Visit     This visit occurred during the SARS-CoV-2 public health emergency.  Safety protocols were in place, including screening questions prior to the visit, additional usage of staff PPE, and extensive cleaning of exam room while observing appropriate contact time as indicated for disinfecting solutions.    CC/Reason for Visit: 22-month follow-up  HPI: Catherine Campbell is a 53 y.o. female who is coming in today for the above mentioned reasons. Past Medical History is significant for: History of hypertension that has been well controlled, hyperlipidemia not on medications, vitamin B12 by monthly IM supplementation due for another injection today, also history of vitamin D deficiency.  She has now completed her 12 weeks of high-dose vitamin D supplementation and is due to have her levels rechecked today.  She also has a history of seasonal allergies and asthma and gets allergy shots.  She has no acute complaints today.  She is requesting a refill of her losartan.   Past Medical/Surgical History: Past Medical History:  Diagnosis Date  . Allergy   . ASCUS with positive high risk human papillomavirus of vagina 06/2013, 12/2016   Colposcopic biopsy koilocytotic atypia.  . Asthma   . Hypertension   . Low grade squamous intraepithelial lesion (LGSIL) 06/2014, 06/2015, 06/2016, 06/2017, 12/2017, 07/2018   positive high-risk HPV. Colposcopic biopsy LGSIL negative ECC 2015,  ECC 2017 with koilocytotic atypia changes    Past Surgical History:  Procedure Laterality Date  . BOIL UNDER ARM      Social History:  reports that she has never smoked. She has never used smokeless tobacco. She reports that she does not drink alcohol or use drugs.  Allergies: Allergies  Allergen Reactions  . Metaxalone Hives  . Penicillins Hives  . Tramadol-Acetaminophen Hives    Family History:  Family History  Problem Relation Age of Onset  . Diabetes Father   . Hypertension  Father   . Colon polyps Father   . Hypertension Mother   . Cancer Mother        Pancreatic  . Colon polyps Mother   . Rectal cancer Neg Hx   . Stomach cancer Neg Hx   . Colon cancer Neg Hx      Current Outpatient Medications:  .  albuterol (PROAIR HFA) 108 (90 Base) MCG/ACT inhaler, , Disp: , Rfl:  .  azelastine (ASTELIN) 0.1 % nasal spray, INSTILL 1 PUFF IN EACH NOSTRIL TWICE A DAY NASALLY 30 DAYS, Disp: , Rfl: 3 .  cetirizine (ZYRTEC) 10 MG tablet, Take 10 mg by mouth daily., Disp: , Rfl:  .  clobetasol cream (TEMOVATE) 0.05 %, Apply at bedtime as needed, Disp: 30 g, Rfl: 1 .  EPIPEN 2-PAK 0.3 MG/0.3ML SOAJ injection, Reported on 09/24/2015, Disp: , Rfl: 0 .  fexofenadine (ALLEGRA) 180 MG tablet, Take 180 mg by mouth daily.  , Disp: , Rfl:  .  fluconazole (DIFLUCAN) 150 MG tablet, Take 1 tablet (150 mg total) by mouth once for 1 dose., Disp: 7 tablet, Rfl: 0 .  fluticasone (FLONASE) 50 MCG/ACT nasal spray, INSTILL 2 SPRAYS IN EACH NOSTRIL ONCE A DAY NASALLY 30 DAYS, Disp: , Rfl: 3 .  ibuprofen (ADVIL,MOTRIN) 200 MG tablet, Take by mouth., Disp: , Rfl:  .  Influenza vac split quadrivalent PF (AFLURIA QUADRIVALENT) 0.5 ML injection, ADM 0.5ML IM UTD, Disp: , Rfl:  .  losartan (COZAAR) 100 MG tablet, TAKE 1 TABLET BY MOUTH EVERY DAY, Disp: 90 tablet, Rfl: 1 .  montelukast (SINGULAIR) 10 MG tablet, Take 10 mg by mouth at bedtime. , Disp: , Rfl:  .  nystatin-triamcinolone (MYCOLOG II) cream, APPLY TO AFFECTED AREA TWICE A DAY, Disp: 30 g, Rfl: 3 .  RESTASIS 0.05 % ophthalmic emulsion, INSTILL 1 DROP INTO BOTH EYES TWICE A DAY, Disp: , Rfl: 3 .  SYMBICORT 80-4.5 MCG/ACT inhaler, , Disp: , Rfl:  .  UNABLE TO FIND, Allergy shots once a week, Disp: , Rfl:   Current Facility-Administered Medications:  .  0.9 %  sodium chloride infusion, 500 mL, Intravenous, Once, Danis, Starr Lake III, MD  Review of Systems:  Constitutional: Denies fever, chills, diaphoresis, appetite change and fatigue.    HEENT: Denies photophobia, eye pain, redness, hearing loss, ear pain, congestion, sore throat, rhinorrhea, sneezing, mouth sores, trouble swallowing, neck pain, neck stiffness and tinnitus.   Respiratory: Denies SOB, DOE, cough, chest tightness,  and wheezing.   Cardiovascular: Denies chest pain, palpitations and leg swelling.  Gastrointestinal: Denies nausea, vomiting, abdominal pain, diarrhea, constipation, blood in stool and abdominal distention.  Genitourinary: Denies dysuria, urgency, frequency, hematuria, flank pain and difficulty urinating.  Endocrine: Denies: hot or cold intolerance, sweats, changes in hair or nails, polyuria, polydipsia. Musculoskeletal: Denies myalgias, back pain, joint swelling, arthralgias and gait problem.  Skin: Denies pallor, rash and wound.  Neurological: Denies dizziness, seizures, syncope, weakness, light-headedness, numbness and headaches.  Hematological: Denies adenopathy. Easy bruising, personal or family bleeding history  Psychiatric/Behavioral: Denies suicidal ideation, mood changes, confusion, nervousness, sleep disturbance and agitation    Physical Exam: Vitals:   08/04/19 1301  BP: 126/74  Pulse: 97  Temp: 98.1 F (36.7 C)  TempSrc: Temporal  SpO2: 98%  Weight: 243 lb 9.6 oz (110.5 kg)    Body mass index is 40.54 kg/m.   Constitutional: NAD, calm, comfortable Eyes: PERRL, lids and conjunctivae normal, wears corrective lenses ENMT: Mucous membranes are moist.  Respiratory: clear to auscultation bilaterally, no wheezing, no crackles. Normal respiratory effort. No accessory muscle use.  Cardiovascular: Regular rate and rhythm, no murmurs / rubs / gallops. No extremity edema. 2+ pedal pulses.  Abdomen: no tenderness, no masses palpated. No hepatosplenomegaly. Bowel sounds positive.  Musculoskeletal: no clubbing / cyanosis. No joint deformity upper and lower extremities. Good ROM, no contractures. Normal muscle tone.  Skin: no rashes,  lesions, ulcers. No induration Neurologic: Grossly intact and nonfocal  Psychiatric: Normal judgment and insight. Alert and oriented x 3. Normal mood.    Impression and Plan:  Vitamin D deficiency -Recheck levels today.  Vitamin B12 deficiency -To receive monthly IM injection today.  Seasonal allergies Mild intermittent chronic asthma without complication -Stable, managed by allergist on allergy shots.  Hyperlipidemia, unspecified hyperlipidemia type -LDL was 140 in August 2020, she is not yet on medications. -Recheck lipids next visit, may need statin medication if it has not improved with lifestyle modifications.  Essential hypertension -Well-controlled, refill Cozaar.    Patient Instructions  -Nice seeing you today!!  -Lab work today; will notify you once results are available.  -See you back in 6 months or sooner if needed.     Chaya Jan, MD [place] [facility]

## 2019-08-04 NOTE — Addendum Note (Signed)
Addended by: Suzette Battiest on: 08/04/2019 01:41 PM   Modules accepted: Orders

## 2019-08-04 NOTE — Progress Notes (Signed)
Catherine Campbell Jul 18, 1966 [id number]        53 y.o.  G0P0000 for annual gynecologic exam.  Was treated for fungal groin rash in March with Mytrex cream.  Symptoms are little better but still persist.  Past medical history,surgical history, problem list, medications, allergies, family history and social history were all reviewed and documented as reviewed in the EPIC chart.  ROS:  Performed with pertinent positives and negatives included in the history, assessment and plan.   Additional significant findings : None   Exam: [name] assistant Vitals:   08/04/19 1104  BP: 124/82  Weight: 241 lb (109.3 kg)  Height: 5\' 5"  (1.651 m)   Body mass index is 40.1 kg/m.  General appearance:  Normal affect, orientation and appearance. Skin: Grossly normal HEENT: Without gross lesions.  No cervical or supraclavicular adenopathy. Thyroid normal.  Lungs:  Clear without wheezing, rales or rhonchi Cardiac: RR, without RMG Abdominal:  Soft, nontender, without masses, guarding, rebound, organomegaly or hernia Breasts:  Examined lying and sitting without masses, retractions, discharge or axillary adenopathy. Pelvic:  Ext, BUS, Vagina: Normal.  Faint rash bilateral groin consistent with low level yeast.  Cervix: Normal.  Pap/HPV smear done  Uterus: Grossly normal size midline mobile nontender.  Adnexa: Without masses or tenderness    Anus and perineum: Normal   Rectovaginal: Normal sphincter tone without palpated masses or tenderness.    Assessment/Plan:  53 y.o. G0P0000 female for annual gynecologic exam.  With regular menses, abstinent contraception  1. Persistent LGSIL with positive high risk HPV.  Colposcopy 10/2018 was normal with negative ECC.  Pap smear/HPV done today.  Triage based upon results.  If continued low-grade then plan follow-up Pap smear in 1 year. 2. Skin rash.  Persist with groin itching.  Consistent with low level yeast.  Had been using Mytrex equivalent.  Will treat  with Diflucan 150 mg daily x7 days and clobetasol 0.05% cream once to twice daily as needed.  Follow-up if symptoms persist. 3. Mammography 10/2018.  Continue with annual mammography when due.  Breast exam normal today. 4. Colonoscopy 2019.  Repeat at their recommended interval. 5. Health maintenance.  No routine lab work done as patient does this elsewhere.  Follow-up 1 year, sooner as needed.   [name], 11:34 AM 08/04/2019

## 2019-08-04 NOTE — Patient Instructions (Signed)
-  Nice seeing you today!!  -Lab work today; will notify you once results are available.  -See you back in 6 months or sooner if needed.

## 2019-08-04 NOTE — Addendum Note (Signed)
Addended by: Rebecca Eaton on: 08/04/2019 01:35 PM   Modules accepted: Orders

## 2019-08-10 ENCOUNTER — Telehealth: Payer: Self-pay | Admitting: Internal Medicine

## 2019-08-10 NOTE — Telephone Encounter (Signed)
Please advise 

## 2019-08-10 NOTE — Telephone Encounter (Signed)
Do you mind  doing her shingirix?

## 2019-08-10 NOTE — Telephone Encounter (Signed)
Pt. Called to discuss recent lab results.  While on phone, inquired about getting 2nd Shingrix shot.  Reported her 1st dose was given in August 2020.  Informed that the 1st and 2nd dose are usually separated 2-6 mos.  The pt asked if she could get her 2nd dose of Shingrix at the same appt. that is Scheduled 09/05/19, for her next B12 injection?  Advised will send a note to Dr. Jerilee Hoh re: this question.  Pt. Agreed with plan.

## 2019-08-14 DIAGNOSIS — J301 Allergic rhinitis due to pollen: Secondary | ICD-10-CM | POA: Diagnosis not present

## 2019-08-14 DIAGNOSIS — J3089 Other allergic rhinitis: Secondary | ICD-10-CM | POA: Diagnosis not present

## 2019-08-15 NOTE — Telephone Encounter (Signed)
No problem.  Noted on appointment.

## 2019-08-22 DIAGNOSIS — J3089 Other allergic rhinitis: Secondary | ICD-10-CM | POA: Diagnosis not present

## 2019-08-22 DIAGNOSIS — J301 Allergic rhinitis due to pollen: Secondary | ICD-10-CM | POA: Diagnosis not present

## 2019-08-30 DIAGNOSIS — J3089 Other allergic rhinitis: Secondary | ICD-10-CM | POA: Diagnosis not present

## 2019-08-30 DIAGNOSIS — J301 Allergic rhinitis due to pollen: Secondary | ICD-10-CM | POA: Diagnosis not present

## 2019-09-05 ENCOUNTER — Ambulatory Visit: Payer: Federal, State, Local not specified - PPO

## 2019-09-05 ENCOUNTER — Other Ambulatory Visit: Payer: Self-pay

## 2019-09-06 ENCOUNTER — Ambulatory Visit: Payer: Federal, State, Local not specified - PPO

## 2019-09-06 ENCOUNTER — Ambulatory Visit (INDEPENDENT_AMBULATORY_CARE_PROVIDER_SITE_OTHER): Payer: Federal, State, Local not specified - PPO | Admitting: *Deleted

## 2019-09-06 DIAGNOSIS — Z23 Encounter for immunization: Secondary | ICD-10-CM | POA: Diagnosis not present

## 2019-09-06 DIAGNOSIS — J301 Allergic rhinitis due to pollen: Secondary | ICD-10-CM | POA: Diagnosis not present

## 2019-09-06 DIAGNOSIS — J3089 Other allergic rhinitis: Secondary | ICD-10-CM | POA: Diagnosis not present

## 2019-09-06 DIAGNOSIS — E538 Deficiency of other specified B group vitamins: Secondary | ICD-10-CM

## 2019-09-06 MED ORDER — CYANOCOBALAMIN 1000 MCG/ML IJ SOLN
1000.0000 ug | Freq: Once | INTRAMUSCULAR | Status: AC
  Administered 2019-09-06: 1000 ug via INTRAMUSCULAR

## 2019-09-06 NOTE — Progress Notes (Signed)
Per orders of Dr. Ardyth Harps, injection of Shingrix and B12 given by Kern Reap. Patient tolerated injection well.

## 2019-09-13 DIAGNOSIS — J301 Allergic rhinitis due to pollen: Secondary | ICD-10-CM | POA: Diagnosis not present

## 2019-09-13 DIAGNOSIS — J3089 Other allergic rhinitis: Secondary | ICD-10-CM | POA: Diagnosis not present

## 2019-09-20 DIAGNOSIS — J301 Allergic rhinitis due to pollen: Secondary | ICD-10-CM | POA: Diagnosis not present

## 2019-09-20 DIAGNOSIS — J3089 Other allergic rhinitis: Secondary | ICD-10-CM | POA: Diagnosis not present

## 2019-10-04 DIAGNOSIS — J301 Allergic rhinitis due to pollen: Secondary | ICD-10-CM | POA: Diagnosis not present

## 2019-10-04 DIAGNOSIS — J3089 Other allergic rhinitis: Secondary | ICD-10-CM | POA: Diagnosis not present

## 2019-10-06 ENCOUNTER — Other Ambulatory Visit: Payer: Self-pay

## 2019-10-09 ENCOUNTER — Other Ambulatory Visit: Payer: Self-pay

## 2019-10-09 ENCOUNTER — Ambulatory Visit (INDEPENDENT_AMBULATORY_CARE_PROVIDER_SITE_OTHER): Payer: Federal, State, Local not specified - PPO | Admitting: *Deleted

## 2019-10-09 DIAGNOSIS — J301 Allergic rhinitis due to pollen: Secondary | ICD-10-CM | POA: Diagnosis not present

## 2019-10-09 DIAGNOSIS — E538 Deficiency of other specified B group vitamins: Secondary | ICD-10-CM

## 2019-10-09 DIAGNOSIS — J3089 Other allergic rhinitis: Secondary | ICD-10-CM | POA: Diagnosis not present

## 2019-10-09 MED ORDER — CYANOCOBALAMIN 1000 MCG/ML IJ SOLN
1000.0000 ug | Freq: Once | INTRAMUSCULAR | Status: AC
  Administered 2019-10-09: 10:00:00 1000 ug via INTRAMUSCULAR

## 2019-10-09 NOTE — Progress Notes (Signed)
Per orders of Dr. Hernandez, injection of B12 given by Trai Ells S Lakeem Rozo. Patient tolerated injection well. 

## 2019-10-19 DIAGNOSIS — J301 Allergic rhinitis due to pollen: Secondary | ICD-10-CM | POA: Diagnosis not present

## 2019-10-19 DIAGNOSIS — J3089 Other allergic rhinitis: Secondary | ICD-10-CM | POA: Diagnosis not present

## 2019-10-25 DIAGNOSIS — J301 Allergic rhinitis due to pollen: Secondary | ICD-10-CM | POA: Diagnosis not present

## 2019-10-25 DIAGNOSIS — J3089 Other allergic rhinitis: Secondary | ICD-10-CM | POA: Diagnosis not present

## 2019-10-28 ENCOUNTER — Other Ambulatory Visit: Payer: Self-pay | Admitting: Internal Medicine

## 2019-10-28 DIAGNOSIS — E559 Vitamin D deficiency, unspecified: Secondary | ICD-10-CM

## 2019-10-31 ENCOUNTER — Other Ambulatory Visit: Payer: Self-pay | Admitting: Internal Medicine

## 2019-10-31 ENCOUNTER — Encounter: Payer: Self-pay | Admitting: Obstetrics and Gynecology

## 2019-10-31 DIAGNOSIS — E559 Vitamin D deficiency, unspecified: Secondary | ICD-10-CM

## 2019-10-31 DIAGNOSIS — Z1231 Encounter for screening mammogram for malignant neoplasm of breast: Secondary | ICD-10-CM | POA: Diagnosis not present

## 2019-11-02 ENCOUNTER — Ambulatory Visit: Payer: Federal, State, Local not specified - PPO | Attending: Internal Medicine

## 2019-11-02 DIAGNOSIS — Z23 Encounter for immunization: Secondary | ICD-10-CM

## 2019-11-02 NOTE — Progress Notes (Signed)
Covid-19 Vaccination Clinic  Name:  Catherine Campbell    MRN: [id number] DOB: 11/01/1965  11/02/2019  Catherine Campbell was observed post Covid-19 immunization for 15 minutes without incident. She was provided with Vaccine Information Sheet and instruction to access the V-Safe system.   Catherine Campbell was instructed to call 911 with any severe reactions post vaccine: Marland Kitchen Difficulty breathing  . Swelling of face and throat  . A fast heartbeat  . A bad rash all over body  . Dizziness and weakness   Immunizations Administered    Name Date Dose VIS Date Route   Pfizer COVID-19 Vaccine 11/02/2019  4:31 PM 0.3 mL 08/04/2019 Intramuscular   Manufacturer: [employer], [employer]   Lot: [id number]   NDC: [address]-2

## 2019-11-07 ENCOUNTER — Other Ambulatory Visit: Payer: Self-pay

## 2019-11-08 ENCOUNTER — Other Ambulatory Visit: Payer: Federal, State, Local not specified - PPO

## 2019-11-08 ENCOUNTER — Ambulatory Visit (INDEPENDENT_AMBULATORY_CARE_PROVIDER_SITE_OTHER): Payer: Federal, State, Local not specified - PPO | Admitting: *Deleted

## 2019-11-08 ENCOUNTER — Other Ambulatory Visit (INDEPENDENT_AMBULATORY_CARE_PROVIDER_SITE_OTHER): Payer: Federal, State, Local not specified - PPO

## 2019-11-08 DIAGNOSIS — E538 Deficiency of other specified B group vitamins: Secondary | ICD-10-CM | POA: Diagnosis not present

## 2019-11-08 DIAGNOSIS — E559 Vitamin D deficiency, unspecified: Secondary | ICD-10-CM | POA: Diagnosis not present

## 2019-11-08 DIAGNOSIS — J301 Allergic rhinitis due to pollen: Secondary | ICD-10-CM | POA: Diagnosis not present

## 2019-11-08 DIAGNOSIS — J3089 Other allergic rhinitis: Secondary | ICD-10-CM | POA: Diagnosis not present

## 2019-11-08 LAB — VITAMIN D 25 HYDROXY (VIT D DEFICIENCY, FRACTURES): VITD: 32.92 ng/mL (ref 30.00–100.00)

## 2019-11-08 MED ORDER — CYANOCOBALAMIN 1000 MCG/ML IJ SOLN
1000.0000 ug | Freq: Once | INTRAMUSCULAR | Status: AC
  Administered 2019-11-08: 1000 ug via INTRAMUSCULAR

## 2019-11-08 NOTE — Progress Notes (Signed)
Per orders of Dr. Hernandez, injection of B12 given by Savhanna Sliva. Patient tolerated injection well.  

## 2019-11-10 ENCOUNTER — Other Ambulatory Visit: Payer: Self-pay | Admitting: Internal Medicine

## 2019-11-10 DIAGNOSIS — E559 Vitamin D deficiency, unspecified: Secondary | ICD-10-CM

## 2019-11-10 MED ORDER — VITAMIN D (ERGOCALCIFEROL) 1.25 MG (50000 UNIT) PO CAPS: 50000.0000 [IU] | ORAL_CAPSULE | ORAL | 0 refills | Status: AC

## 2019-11-15 DIAGNOSIS — J301 Allergic rhinitis due to pollen: Secondary | ICD-10-CM | POA: Diagnosis not present

## 2019-11-15 DIAGNOSIS — J3089 Other allergic rhinitis: Secondary | ICD-10-CM | POA: Diagnosis not present

## 2019-11-22 DIAGNOSIS — J3089 Other allergic rhinitis: Secondary | ICD-10-CM | POA: Diagnosis not present

## 2019-11-22 DIAGNOSIS — J301 Allergic rhinitis due to pollen: Secondary | ICD-10-CM | POA: Diagnosis not present

## 2019-11-27 ENCOUNTER — Ambulatory Visit: Payer: Federal, State, Local not specified - PPO | Attending: Internal Medicine

## 2019-11-27 DIAGNOSIS — Z23 Encounter for immunization: Secondary | ICD-10-CM

## 2019-11-27 NOTE — Progress Notes (Signed)
Covid-19 Vaccination Clinic  Name:  ADDELYNNE MENG    MRN: [id number] DOB: 1966/08/17  11/27/2019  Ms. Hilder was observed post Covid-19 immunization for 15 minutes without incident. She was provided with Vaccine Information Sheet and instruction to access the V-Safe system.   Ms. Conneely was instructed to call 911 with any severe reactions post vaccine: Marland Kitchen Difficulty breathing  . Swelling of face and throat  . A fast heartbeat  . A bad rash all over body  . Dizziness and weakness   Immunizations Administered    Name Date Dose VIS Date Route   Pfizer COVID-19 Vaccine 11/27/2019  4:48 PM 0.3 mL 08/04/2019 Intramuscular   Manufacturer: [employer], [employer]   Lot: [id number]   NDC: [address]-6

## 2019-12-05 DIAGNOSIS — J301 Allergic rhinitis due to pollen: Secondary | ICD-10-CM | POA: Diagnosis not present

## 2019-12-05 DIAGNOSIS — J3089 Other allergic rhinitis: Secondary | ICD-10-CM | POA: Diagnosis not present

## 2019-12-06 ENCOUNTER — Other Ambulatory Visit: Payer: Self-pay

## 2019-12-07 ENCOUNTER — Encounter: Payer: Self-pay | Admitting: Internal Medicine

## 2019-12-07 ENCOUNTER — Ambulatory Visit (INDEPENDENT_AMBULATORY_CARE_PROVIDER_SITE_OTHER): Payer: Federal, State, Local not specified - PPO | Admitting: Internal Medicine

## 2019-12-07 VITALS — BP 140/90 | HR 93 | Temp 97.7°F | Wt 240.2 lb

## 2019-12-07 DIAGNOSIS — M542 Cervicalgia: Secondary | ICD-10-CM

## 2019-12-07 DIAGNOSIS — E538 Deficiency of other specified B group vitamins: Secondary | ICD-10-CM

## 2019-12-07 DIAGNOSIS — J3089 Other allergic rhinitis: Secondary | ICD-10-CM | POA: Diagnosis not present

## 2019-12-07 DIAGNOSIS — M25512 Pain in left shoulder: Secondary | ICD-10-CM | POA: Diagnosis not present

## 2019-12-07 DIAGNOSIS — J301 Allergic rhinitis due to pollen: Secondary | ICD-10-CM | POA: Diagnosis not present

## 2019-12-07 MED ORDER — CYANOCOBALAMIN 1000 MCG/ML IJ SOLN
1000.0000 ug | Freq: Once | INTRAMUSCULAR | Status: AC
  Administered 2019-12-07: 1000 ug via INTRAMUSCULAR

## 2019-12-07 NOTE — Patient Instructions (Signed)
-  Nice seeing you today!!  -For your left neck and shoulder: icing for 15 min at least twice a day, local massage therapy, ibuprofen as needed, weighted shoulder pad, focus on posture while at the computer.  -Let me know if not better in about 2 weeks.

## 2019-12-07 NOTE — Progress Notes (Signed)
Acute Office Visit     This visit occurred during the SARS-CoV-2 public health emergency.  Safety protocols were in place, including screening questions prior to the visit, additional usage of staff PPE, and extensive cleaning of exam room while observing appropriate contact time as indicated for disinfecting solutions.    CC/Reason for Visit: Left neck and shoulder pain  HPI: Catherine Campbell is a 54 y.o. female who is coming in today for the above mentioned reasons. Left neck and shoulder has been hurting for about 2 weeks, feels like a dull/achy pain, no injury that she can recall, no weakness, no numbness or tingling of that arm. She received her COVID vaccines in her other arm. She did get 2 allergy shots in her left arm that hurt more than usual. She has been working a lot at the computer.   Past Medical/Surgical History: Past Medical History:  Diagnosis Date  . Allergy   . ASCUS with positive high risk human papillomavirus of vagina 06/2013, 12/2016   Colposcopic biopsy koilocytotic atypia.  . Asthma   . Hypertension   . Low grade squamous intraepithelial lesion (LGSIL) 06/2014, 06/2015, 06/2016, 06/2017, 12/2017, 07/2018   positive high-risk HPV. Colposcopic biopsy LGSIL negative ECC 2015,  ECC 2017 with koilocytotic atypia changes    Past Surgical History:  Procedure Laterality Date  . BOIL UNDER ARM      Social History:  reports that she has never smoked. She has never used smokeless tobacco. She reports that she does not drink alcohol or use drugs.  Allergies: Allergies  Allergen Reactions  . Metaxalone Hives  . Penicillins Hives  . Tramadol-Acetaminophen Hives    Family History:  Family History  Problem Relation Age of Onset  . Diabetes Father   . Hypertension Father   . Colon polyps Father   . Hypertension Mother   . Cancer Mother        Pancreatic  . Colon polyps Mother   . Rectal cancer Neg Hx   . Stomach cancer Neg Hx   . Colon cancer Neg Hx        Current Outpatient Medications:  .  albuterol (PROAIR HFA) 108 (90 Base) MCG/ACT inhaler, , Disp: , Rfl:  .  azelastine (ASTELIN) 0.1 % nasal spray, INSTILL 1 PUFF IN EACH NOSTRIL TWICE A DAY NASALLY 30 DAYS, Disp: , Rfl: 3 .  cetirizine (ZYRTEC) 10 MG tablet, Take 10 mg by mouth daily., Disp: , Rfl:  .  clobetasol cream (TEMOVATE) 0.05 %, Apply at bedtime as needed, Disp: 30 g, Rfl: 1 .  EPIPEN 2-PAK 0.3 MG/0.3ML SOAJ injection, Reported on 09/24/2015, Disp: , Rfl: 0 .  fexofenadine (ALLEGRA) 180 MG tablet, Take 180 mg by mouth daily.  , Disp: , Rfl:  .  fluticasone (FLONASE) 50 MCG/ACT nasal spray, INSTILL 2 SPRAYS IN EACH NOSTRIL ONCE A DAY NASALLY 30 DAYS, Disp: , Rfl: 3 .  ibuprofen (ADVIL,MOTRIN) 200 MG tablet, Take by mouth., Disp: , Rfl:  .  Influenza vac split quadrivalent PF (AFLURIA QUADRIVALENT) 0.5 ML injection, ADM 0.5ML IM UTD, Disp: , Rfl:  .  losartan (COZAAR) 100 MG tablet, TAKE 1 TABLET BY MOUTH EVERY DAY, Disp: 90 tablet, Rfl: 1 .  montelukast (SINGULAIR) 10 MG tablet, Take 10 mg by mouth at bedtime. , Disp: , Rfl:  .  nystatin-triamcinolone (MYCOLOG II) cream, APPLY TO AFFECTED AREA TWICE A DAY, Disp: 30 g, Rfl: 3 .  RESTASIS 0.05 % ophthalmic emulsion, INSTILL 1  DROP INTO BOTH EYES TWICE A DAY, Disp: , Rfl: 3 .  SYMBICORT 80-4.5 MCG/ACT inhaler, , Disp: , Rfl:  .  UNABLE TO FIND, Allergy shots once a week, Disp: , Rfl:  .  Vitamin D, Ergocalciferol, (DRISDOL) 1.25 MG (50000 UNIT) CAPS capsule, Take 1 capsule (50,000 Units total) by mouth every 7 (seven) days for 12 doses., Disp: 12 capsule, Rfl: 0  Current Facility-Administered Medications:  .  0.9 %  sodium chloride infusion, 500 mL, Intravenous, Once, Danis, Estill Cotta III, MD  Review of Systems:  Constitutional: Denies fever, chills, diaphoresis, appetite change and fatigue.  HEENT: Denies photophobia, eye pain, redness, hearing loss, ear pain, congestion, sore throat, rhinorrhea, sneezing, mouth sores, trouble  swallowing, neck pain, neck stiffness and tinnitus.   Respiratory: Denies SOB, DOE, cough, chest tightness,  and wheezing.   Cardiovascular: Denies chest pain, palpitations and leg swelling.  Gastrointestinal: Denies nausea, vomiting, abdominal pain, diarrhea, constipation, blood in stool and abdominal distention.  Genitourinary: Denies dysuria, urgency, frequency, hematuria, flank pain and difficulty urinating.  Endocrine: Denies: hot or cold intolerance, sweats, changes in hair or nails, polyuria, polydipsia. Musculoskeletal: Denies  back pain, joint swelling, arthralgias and gait problem.  Skin: Denies pallor, rash and wound.  Neurological: Denies dizziness, seizures, syncope, weakness, light-headedness, numbness and headaches.  Hematological: Denies adenopathy. Easy bruising, personal or family bleeding history  Psychiatric/Behavioral: Denies suicidal ideation, mood changes, confusion, nervousness, sleep disturbance and agitation    Physical Exam: Vitals:   12/07/19 0805  BP: 140/90  Pulse: 93  Temp: 97.7 F (36.5 C)  TempSrc: Temporal  SpO2: 97%  Weight: 240 lb 3.2 oz (109 kg)    Body mass index is 39.97 kg/m.   Constitutional: NAD, calm, comfortable Eyes: PERRL, lids and conjunctivae normal, wears corrective lenses ENMT: Mucous membranes are moist . Musculoskeletal:Full ROM of left shoulder, no joint crepitus, swelling or erythema. Able to abduct, adduct without issues or pain.   Psychiatric: Normal judgment and insight. Alert and oriented x 3. Normal mood.    Impression and Plan:  Neck pain on left side Acute pain of left shoulder -Suspect muscular in etiology: PRN NSAIDs, icing, local massage therapy, better posture, weighted shoulder pad. -RTC in 14 days if no better.    Patient Instructions  -Nice seeing you today!!  -For your left neck and shoulder: icing for 15 min at least twice a day, local massage therapy, ibuprofen as needed, weighted shoulder pad,  focus on posture while at the computer.  -Let me know if not better in about 2 weeks.     Lelon Frohlich, MD  Primary Care at [facility]

## 2019-12-11 ENCOUNTER — Ambulatory Visit: Payer: Federal, State, Local not specified - PPO

## 2019-12-13 DIAGNOSIS — J3089 Other allergic rhinitis: Secondary | ICD-10-CM | POA: Diagnosis not present

## 2019-12-13 DIAGNOSIS — J301 Allergic rhinitis due to pollen: Secondary | ICD-10-CM | POA: Diagnosis not present

## 2019-12-15 DIAGNOSIS — M25512 Pain in left shoulder: Secondary | ICD-10-CM | POA: Diagnosis not present

## 2019-12-15 DIAGNOSIS — J3089 Other allergic rhinitis: Secondary | ICD-10-CM | POA: Diagnosis not present

## 2019-12-15 DIAGNOSIS — M542 Cervicalgia: Secondary | ICD-10-CM | POA: Diagnosis not present

## 2019-12-15 DIAGNOSIS — J301 Allergic rhinitis due to pollen: Secondary | ICD-10-CM | POA: Diagnosis not present

## 2019-12-18 DIAGNOSIS — M438X2 Other specified deforming dorsopathies, cervical region: Secondary | ICD-10-CM | POA: Diagnosis not present

## 2019-12-18 DIAGNOSIS — M47812 Spondylosis without myelopathy or radiculopathy, cervical region: Secondary | ICD-10-CM | POA: Diagnosis not present

## 2019-12-18 DIAGNOSIS — M19012 Primary osteoarthritis, left shoulder: Secondary | ICD-10-CM | POA: Diagnosis not present

## 2019-12-20 DIAGNOSIS — K027 Dental root caries: Secondary | ICD-10-CM | POA: Diagnosis not present

## 2019-12-20 DIAGNOSIS — M5412 Radiculopathy, cervical region: Secondary | ICD-10-CM | POA: Diagnosis not present

## 2019-12-20 DIAGNOSIS — J301 Allergic rhinitis due to pollen: Secondary | ICD-10-CM | POA: Diagnosis not present

## 2019-12-20 DIAGNOSIS — R930 Abnormal findings on diagnostic imaging of skull and head, not elsewhere classified: Secondary | ICD-10-CM | POA: Diagnosis not present

## 2019-12-20 DIAGNOSIS — J3089 Other allergic rhinitis: Secondary | ICD-10-CM | POA: Diagnosis not present

## 2019-12-21 ENCOUNTER — Ambulatory Visit: Payer: Federal, State, Local not specified - PPO | Admitting: Internal Medicine

## 2019-12-28 DIAGNOSIS — J301 Allergic rhinitis due to pollen: Secondary | ICD-10-CM | POA: Diagnosis not present

## 2019-12-28 DIAGNOSIS — J3089 Other allergic rhinitis: Secondary | ICD-10-CM | POA: Diagnosis not present

## 2020-01-03 DIAGNOSIS — J301 Allergic rhinitis due to pollen: Secondary | ICD-10-CM | POA: Diagnosis not present

## 2020-01-03 DIAGNOSIS — J3089 Other allergic rhinitis: Secondary | ICD-10-CM | POA: Diagnosis not present

## 2020-01-09 DIAGNOSIS — E237 Disorder of pituitary gland, unspecified: Secondary | ICD-10-CM | POA: Diagnosis not present

## 2020-01-10 ENCOUNTER — Ambulatory Visit (INDEPENDENT_AMBULATORY_CARE_PROVIDER_SITE_OTHER): Payer: Federal, State, Local not specified - PPO

## 2020-01-10 ENCOUNTER — Other Ambulatory Visit: Payer: Self-pay

## 2020-01-10 DIAGNOSIS — J3089 Other allergic rhinitis: Secondary | ICD-10-CM | POA: Diagnosis not present

## 2020-01-10 DIAGNOSIS — E538 Deficiency of other specified B group vitamins: Secondary | ICD-10-CM

## 2020-01-10 DIAGNOSIS — J301 Allergic rhinitis due to pollen: Secondary | ICD-10-CM | POA: Diagnosis not present

## 2020-01-10 MED ORDER — CYANOCOBALAMIN 1000 MCG/ML IJ SOLN
1000.0000 ug | Freq: Once | INTRAMUSCULAR | Status: AC
  Administered 2020-01-10: 1000 ug via INTRAMUSCULAR

## 2020-01-10 NOTE — Progress Notes (Signed)
Per orders of Dr.Hernandez , injection of B12 given in left deltoid by Baudelia Schroepfer R Graylon Amory. Patient tolerated injection well.  

## 2020-01-10 NOTE — Patient Instructions (Signed)
Health Maintenance Due  Topic Date Due  . HIV Screening  Never done    Depression screen [facility] 2/9 04/04/2019 03/31/2018 08/30/2013  Decreased Interest 0 0 0  Down, Depressed, Hopeless 0 0 0  PHQ - 2 Score 0 0 0  Altered sleeping 0 - -  Tired, decreased energy 0 - -  Change in appetite 0 - -  Feeling bad or failure about yourself  0 - -  Trouble concentrating 0 - -  Moving slowly or fidgety/restless 0 - -  Suicidal thoughts 0 - -  PHQ-9 Score 0 - -  Difficult doing work/chores Not difficult at all - -

## 2020-01-19 DIAGNOSIS — J3089 Other allergic rhinitis: Secondary | ICD-10-CM | POA: Diagnosis not present

## 2020-01-19 DIAGNOSIS — H1045 Other chronic allergic conjunctivitis: Secondary | ICD-10-CM | POA: Diagnosis not present

## 2020-01-19 DIAGNOSIS — J4540 Moderate persistent asthma, uncomplicated: Secondary | ICD-10-CM | POA: Diagnosis not present

## 2020-01-19 DIAGNOSIS — J301 Allergic rhinitis due to pollen: Secondary | ICD-10-CM | POA: Diagnosis not present

## 2020-01-24 DIAGNOSIS — J301 Allergic rhinitis due to pollen: Secondary | ICD-10-CM | POA: Diagnosis not present

## 2020-01-24 DIAGNOSIS — J3089 Other allergic rhinitis: Secondary | ICD-10-CM | POA: Diagnosis not present

## 2020-01-25 ENCOUNTER — Other Ambulatory Visit: Payer: Self-pay | Admitting: Internal Medicine

## 2020-01-25 DIAGNOSIS — E559 Vitamin D deficiency, unspecified: Secondary | ICD-10-CM

## 2020-01-28 ENCOUNTER — Other Ambulatory Visit: Payer: Self-pay | Admitting: Internal Medicine

## 2020-01-28 DIAGNOSIS — I10 Essential (primary) hypertension: Secondary | ICD-10-CM

## 2020-02-01 DIAGNOSIS — H40023 Open angle with borderline findings, high risk, bilateral: Secondary | ICD-10-CM | POA: Diagnosis not present

## 2020-02-01 DIAGNOSIS — H524 Presbyopia: Secondary | ICD-10-CM | POA: Diagnosis not present

## 2020-02-01 DIAGNOSIS — H52203 Unspecified astigmatism, bilateral: Secondary | ICD-10-CM | POA: Diagnosis not present

## 2020-02-01 DIAGNOSIS — H16223 Keratoconjunctivitis sicca, not specified as Sjogren's, bilateral: Secondary | ICD-10-CM | POA: Diagnosis not present

## 2020-02-01 DIAGNOSIS — H10413 Chronic giant papillary conjunctivitis, bilateral: Secondary | ICD-10-CM | POA: Diagnosis not present

## 2020-02-01 DIAGNOSIS — H5213 Myopia, bilateral: Secondary | ICD-10-CM | POA: Diagnosis not present

## 2020-02-01 DIAGNOSIS — J3089 Other allergic rhinitis: Secondary | ICD-10-CM | POA: Diagnosis not present

## 2020-02-01 DIAGNOSIS — J301 Allergic rhinitis due to pollen: Secondary | ICD-10-CM | POA: Diagnosis not present

## 2020-02-02 ENCOUNTER — Ambulatory Visit: Payer: Federal, State, Local not specified - PPO | Admitting: Internal Medicine

## 2020-02-05 ENCOUNTER — Other Ambulatory Visit: Payer: Self-pay

## 2020-02-06 ENCOUNTER — Encounter: Payer: Self-pay | Admitting: Internal Medicine

## 2020-02-06 ENCOUNTER — Ambulatory Visit (INDEPENDENT_AMBULATORY_CARE_PROVIDER_SITE_OTHER): Payer: Federal, State, Local not specified - PPO | Admitting: Internal Medicine

## 2020-02-06 VITALS — BP 120/80 | HR 94 | Temp 97.1°F | Wt 234.4 lb

## 2020-02-06 DIAGNOSIS — E559 Vitamin D deficiency, unspecified: Secondary | ICD-10-CM | POA: Diagnosis not present

## 2020-02-06 DIAGNOSIS — E538 Deficiency of other specified B group vitamins: Secondary | ICD-10-CM | POA: Diagnosis not present

## 2020-02-06 DIAGNOSIS — I10 Essential (primary) hypertension: Secondary | ICD-10-CM

## 2020-02-06 DIAGNOSIS — E785 Hyperlipidemia, unspecified: Secondary | ICD-10-CM

## 2020-02-06 DIAGNOSIS — J301 Allergic rhinitis due to pollen: Secondary | ICD-10-CM | POA: Diagnosis not present

## 2020-02-06 DIAGNOSIS — J3089 Other allergic rhinitis: Secondary | ICD-10-CM | POA: Diagnosis not present

## 2020-02-06 MED ORDER — CYANOCOBALAMIN 1000 MCG/ML IJ SOLN
1000.0000 ug | Freq: Once | INTRAMUSCULAR | Status: AC
  Administered 2020-02-06: 1000 ug via INTRAMUSCULAR

## 2020-02-06 NOTE — Progress Notes (Signed)
Established Patient Office Visit     This visit occurred during the SARS-CoV-2 public health emergency.  Safety protocols were in place, including screening questions prior to the visit, additional usage of staff PPE, and extensive cleaning of exam room while observing appropriate contact time as indicated for disinfecting solutions.    CC/Reason for Visit: 55-month follow-up chronic conditions  HPI: Catherine Campbell is a 54 y.o. female who is coming in today for the above mentioned reasons. Past Medical History is significant for: hypertension that has been well controlled, hyperlipidemia not on medications, vitamin B12 by monthly IM supplementation due for another injection today, also history of vitamin D deficiency.   She also has a history of seasonal allergies and asthma and gets allergy shots.  She has no acute complaints today.  Since I last saw her she has completed her Covid vaccination series   Past Medical/Surgical History: Past Medical History:  Diagnosis Date  . Allergy   . ASCUS with positive high risk human papillomavirus of vagina 06/2013, 12/2016   Colposcopic biopsy koilocytotic atypia.  . Asthma   . Hypertension   . Low grade squamous intraepithelial lesion (LGSIL) 06/2014, 06/2015, 06/2016, 06/2017, 12/2017, 07/2018   positive high-risk HPV. Colposcopic biopsy LGSIL negative ECC 2015,  ECC 2017 with koilocytotic atypia changes    Past Surgical History:  Procedure Laterality Date  . BOIL UNDER ARM      Social History:  reports that she has never smoked. She has never used smokeless tobacco. She reports that she does not drink alcohol and does not use drugs.  Allergies: Allergies  Allergen Reactions  . Metaxalone Hives  . Penicillins Hives  . Tramadol-Acetaminophen Hives    Family History:  Family History  Problem Relation Age of Onset  . Diabetes Father   . Hypertension Father   . Colon polyps Father   . Hypertension Mother   . Cancer Mother         Pancreatic  . Colon polyps Mother   . Rectal cancer Neg Hx   . Stomach cancer Neg Hx   . Colon cancer Neg Hx      Current Outpatient Medications:  .  albuterol (PROAIR HFA) 108 (90 Base) MCG/ACT inhaler, , Disp: , Rfl:  .  azelastine (ASTELIN) 0.1 % nasal spray, INSTILL 1 PUFF IN EACH NOSTRIL TWICE A DAY NASALLY 30 DAYS, Disp: , Rfl: 3 .  cetirizine (ZYRTEC) 10 MG tablet, Take 10 mg by mouth daily., Disp: , Rfl:  .  clobetasol cream (TEMOVATE) 0.05 %, Apply at bedtime as needed, Disp: 30 g, Rfl: 1 .  EPIPEN 2-PAK 0.3 MG/0.3ML SOAJ injection, Reported on 09/24/2015, Disp: , Rfl: 0 .  fexofenadine (ALLEGRA) 180 MG tablet, Take 180 mg by mouth daily.  , Disp: , Rfl:  .  fluticasone (FLONASE) 50 MCG/ACT nasal spray, INSTILL 2 SPRAYS IN EACH NOSTRIL ONCE A DAY NASALLY 30 DAYS, Disp: , Rfl: 3 .  ibuprofen (ADVIL,MOTRIN) 200 MG tablet, Take by mouth., Disp: , Rfl:  .  Influenza vac split quadrivalent PF (AFLURIA QUADRIVALENT) 0.5 ML injection, ADM 0.5ML IM UTD, Disp: , Rfl:  .  losartan (COZAAR) 100 MG tablet, TAKE 1 TABLET BY MOUTH EVERY DAY, Disp: 90 tablet, Rfl: 1 .  montelukast (SINGULAIR) 10 MG tablet, Take 10 mg by mouth at bedtime. , Disp: , Rfl:  .  nystatin-triamcinolone (MYCOLOG II) cream, APPLY TO AFFECTED AREA TWICE A DAY, Disp: 30 g, Rfl: 3 .  RESTASIS  0.05 % ophthalmic emulsion, INSTILL 1 DROP INTO BOTH EYES TWICE A DAY, Disp: , Rfl: 3 .  SYMBICORT 80-4.5 MCG/ACT inhaler, , Disp: , Rfl:  .  UNABLE TO FIND, Allergy shots once a week, Disp: , Rfl:   Current Facility-Administered Medications:  .  0.9 %  sodium chloride infusion, 500 mL, Intravenous, Once, Danis, Starr Lake III, MD  Review of Systems:  Constitutional: Denies fever, chills, diaphoresis, appetite change and fatigue.  HEENT: Denies photophobia, eye pain, redness, hearing loss, ear pain, congestion, sore throat, rhinorrhea, sneezing, mouth sores, trouble swallowing, neck pain, neck stiffness and tinnitus.   Respiratory:  Denies SOB, DOE, cough, chest tightness,  and wheezing.   Cardiovascular: Denies chest pain, palpitations and leg swelling.  Gastrointestinal: Denies nausea, vomiting, abdominal pain, diarrhea, constipation, blood in stool and abdominal distention.  Genitourinary: Denies dysuria, urgency, frequency, hematuria, flank pain and difficulty urinating.  Endocrine: Denies: hot or cold intolerance, sweats, changes in hair or nails, polyuria, polydipsia. Musculoskeletal: Denies myalgias, back pain, joint swelling, arthralgias and gait problem.  Skin: Denies pallor, rash and wound.  Neurological: Denies dizziness, seizures, syncope, weakness, light-headedness, numbness and headaches.  Hematological: Denies adenopathy. Easy bruising, personal or family bleeding history  Psychiatric/Behavioral: Denies suicidal ideation, mood changes, confusion, nervousness, sleep disturbance and agitation    Physical Exam: Vitals:   02/06/20 1455  BP: 120/80  Pulse: 94  Temp: (!) 97.1 F (36.2 C)  TempSrc: Temporal  SpO2: 97%  Weight: 234 lb 6.4 oz (106.3 kg)    Body mass index is 39.01 kg/m.   Constitutional: NAD, calm, comfortable Eyes: PERRL, lids and conjunctivae normal, wears corrective lenses ENMT: Mucous membranes are moist.  Respiratory: clear to auscultation bilaterally, no wheezing, no crackles. Normal respiratory effort. No accessory muscle use.  Cardiovascular: Regular rate and rhythm, no murmurs / rubs / gallops. No extremity edema.  Neurologic: Grossly intact and nonfocal Psychiatric: Normal judgment and insight. Alert and oriented x 3. Normal mood.    Impression and Plan:  Essential hypertension -Well-controlled on current regimen.  Vitamin B12 deficiency -For IM injection today.  Hyperlipidemia, unspecified hyperlipidemia type -Last LDL was 140 in August 2020. -Recheck when she returns for physical, consider initiating statin due to risk factors.  Vitamin D deficiency -Recheck  levels when she returns for physical.  Morbid obesity (HCC), Chronic -Discussed healthy lifestyle, including increased physical activity and better food choices to promote weight loss. -She has been congratulated on her 6 pound weight loss since her last visit.    Patient Instructions  -Nice seeing you today!!  -B12 injection today.  -Schedule your physical in 6 months. Please come in fasting that day.       Chaya Jan, MD [facility]

## 2020-02-06 NOTE — Addendum Note (Signed)
Addended by: Kern Reap B on: 02/06/2020 05:27 PM   Modules accepted: Orders

## 2020-02-06 NOTE — Patient Instructions (Signed)
-  Nice seeing you today!!  -B12 injection today.  -Schedule your physical in 6 months. Please come in fasting that day.

## 2020-02-14 ENCOUNTER — Other Ambulatory Visit (INDEPENDENT_AMBULATORY_CARE_PROVIDER_SITE_OTHER): Payer: Federal, State, Local not specified - PPO

## 2020-02-14 ENCOUNTER — Other Ambulatory Visit: Payer: Self-pay

## 2020-02-14 DIAGNOSIS — E559 Vitamin D deficiency, unspecified: Secondary | ICD-10-CM | POA: Diagnosis not present

## 2020-02-14 DIAGNOSIS — J301 Allergic rhinitis due to pollen: Secondary | ICD-10-CM | POA: Diagnosis not present

## 2020-02-14 DIAGNOSIS — J3081 Allergic rhinitis due to animal (cat) (dog) hair and dander: Secondary | ICD-10-CM | POA: Diagnosis not present

## 2020-02-14 DIAGNOSIS — J3089 Other allergic rhinitis: Secondary | ICD-10-CM | POA: Diagnosis not present

## 2020-02-15 ENCOUNTER — Other Ambulatory Visit: Payer: Self-pay | Admitting: Internal Medicine

## 2020-02-15 DIAGNOSIS — E559 Vitamin D deficiency, unspecified: Secondary | ICD-10-CM

## 2020-02-15 LAB — VITAMIN D 25 HYDROXY (VIT D DEFICIENCY, FRACTURES): VITD: 34.03 ng/mL (ref 30.00–100.00)

## 2020-02-15 MED ORDER — VITAMIN D (ERGOCALCIFEROL) 1.25 MG (50000 UNIT) PO CAPS: 50000.0000 [IU] | ORAL_CAPSULE | ORAL | 0 refills | Status: AC

## 2020-02-21 ENCOUNTER — Other Ambulatory Visit: Payer: Self-pay | Admitting: Internal Medicine

## 2020-02-21 DIAGNOSIS — J301 Allergic rhinitis due to pollen: Secondary | ICD-10-CM | POA: Diagnosis not present

## 2020-02-21 DIAGNOSIS — J3089 Other allergic rhinitis: Secondary | ICD-10-CM | POA: Diagnosis not present

## 2020-02-21 DIAGNOSIS — E559 Vitamin D deficiency, unspecified: Secondary | ICD-10-CM

## 2020-02-23 DIAGNOSIS — J3089 Other allergic rhinitis: Secondary | ICD-10-CM | POA: Diagnosis not present

## 2020-02-23 DIAGNOSIS — J301 Allergic rhinitis due to pollen: Secondary | ICD-10-CM | POA: Diagnosis not present

## 2020-02-27 DIAGNOSIS — J3089 Other allergic rhinitis: Secondary | ICD-10-CM | POA: Diagnosis not present

## 2020-02-27 DIAGNOSIS — J301 Allergic rhinitis due to pollen: Secondary | ICD-10-CM | POA: Diagnosis not present

## 2020-02-29 DIAGNOSIS — J3089 Other allergic rhinitis: Secondary | ICD-10-CM | POA: Diagnosis not present

## 2020-02-29 DIAGNOSIS — J301 Allergic rhinitis due to pollen: Secondary | ICD-10-CM | POA: Diagnosis not present

## 2020-03-04 DIAGNOSIS — J3089 Other allergic rhinitis: Secondary | ICD-10-CM | POA: Diagnosis not present

## 2020-03-04 DIAGNOSIS — J301 Allergic rhinitis due to pollen: Secondary | ICD-10-CM | POA: Diagnosis not present

## 2020-03-07 ENCOUNTER — Ambulatory Visit (INDEPENDENT_AMBULATORY_CARE_PROVIDER_SITE_OTHER): Payer: Federal, State, Local not specified - PPO | Admitting: *Deleted

## 2020-03-07 ENCOUNTER — Other Ambulatory Visit: Payer: Self-pay

## 2020-03-07 DIAGNOSIS — E538 Deficiency of other specified B group vitamins: Secondary | ICD-10-CM

## 2020-03-07 MED ORDER — CYANOCOBALAMIN 1000 MCG/ML IJ SOLN
1000.0000 ug | Freq: Once | INTRAMUSCULAR | Status: AC
  Administered 2020-03-07: 1000 ug via INTRAMUSCULAR

## 2020-03-07 NOTE — Progress Notes (Signed)
Per orders of Dr. Hernandez Acosta, injection of B 12 given by Adiva Boettner S Brandun Pinn. Patient tolerated injection well. 

## 2020-03-11 DIAGNOSIS — J3089 Other allergic rhinitis: Secondary | ICD-10-CM | POA: Diagnosis not present

## 2020-03-11 DIAGNOSIS — J301 Allergic rhinitis due to pollen: Secondary | ICD-10-CM | POA: Diagnosis not present

## 2020-03-20 DIAGNOSIS — H40023 Open angle with borderline findings, high risk, bilateral: Secondary | ICD-10-CM | POA: Diagnosis not present

## 2020-03-20 DIAGNOSIS — J3089 Other allergic rhinitis: Secondary | ICD-10-CM | POA: Diagnosis not present

## 2020-03-20 DIAGNOSIS — J301 Allergic rhinitis due to pollen: Secondary | ICD-10-CM | POA: Diagnosis not present

## 2020-03-27 DIAGNOSIS — J3089 Other allergic rhinitis: Secondary | ICD-10-CM | POA: Diagnosis not present

## 2020-03-27 DIAGNOSIS — J301 Allergic rhinitis due to pollen: Secondary | ICD-10-CM | POA: Diagnosis not present

## 2020-04-04 DIAGNOSIS — J301 Allergic rhinitis due to pollen: Secondary | ICD-10-CM | POA: Diagnosis not present

## 2020-04-04 DIAGNOSIS — J3089 Other allergic rhinitis: Secondary | ICD-10-CM | POA: Diagnosis not present

## 2020-04-08 ENCOUNTER — Ambulatory Visit (INDEPENDENT_AMBULATORY_CARE_PROVIDER_SITE_OTHER): Payer: Federal, State, Local not specified - PPO

## 2020-04-08 ENCOUNTER — Other Ambulatory Visit: Payer: Self-pay

## 2020-04-08 DIAGNOSIS — E538 Deficiency of other specified B group vitamins: Secondary | ICD-10-CM | POA: Diagnosis not present

## 2020-04-08 DIAGNOSIS — J3089 Other allergic rhinitis: Secondary | ICD-10-CM | POA: Diagnosis not present

## 2020-04-08 DIAGNOSIS — J3081 Allergic rhinitis due to animal (cat) (dog) hair and dander: Secondary | ICD-10-CM | POA: Diagnosis not present

## 2020-04-08 DIAGNOSIS — J301 Allergic rhinitis due to pollen: Secondary | ICD-10-CM | POA: Diagnosis not present

## 2020-04-08 MED ORDER — CYANOCOBALAMIN 1000 MCG/ML IJ SOLN
1000.0000 ug | Freq: Once | INTRAMUSCULAR | Status: AC
  Administered 2020-04-08: 1000 ug via INTRAMUSCULAR

## 2020-04-08 NOTE — Progress Notes (Signed)
Per orders of Dr. Hernandez , injection of Cyanocobalamin 1,000 mcg/mL given by Cordel Drewes N Kmarion Rawl. °Patient tolerated injection well. ° °

## 2020-04-12 DIAGNOSIS — J3089 Other allergic rhinitis: Secondary | ICD-10-CM | POA: Diagnosis not present

## 2020-04-12 DIAGNOSIS — J301 Allergic rhinitis due to pollen: Secondary | ICD-10-CM | POA: Diagnosis not present

## 2020-04-19 DIAGNOSIS — J3081 Allergic rhinitis due to animal (cat) (dog) hair and dander: Secondary | ICD-10-CM | POA: Diagnosis not present

## 2020-04-19 DIAGNOSIS — J301 Allergic rhinitis due to pollen: Secondary | ICD-10-CM | POA: Diagnosis not present

## 2020-04-19 DIAGNOSIS — J3089 Other allergic rhinitis: Secondary | ICD-10-CM | POA: Diagnosis not present

## 2020-04-25 DIAGNOSIS — J301 Allergic rhinitis due to pollen: Secondary | ICD-10-CM | POA: Diagnosis not present

## 2020-04-25 DIAGNOSIS — J3089 Other allergic rhinitis: Secondary | ICD-10-CM | POA: Diagnosis not present

## 2020-05-02 DIAGNOSIS — J301 Allergic rhinitis due to pollen: Secondary | ICD-10-CM | POA: Diagnosis not present

## 2020-05-02 DIAGNOSIS — J3089 Other allergic rhinitis: Secondary | ICD-10-CM | POA: Diagnosis not present

## 2020-05-04 ENCOUNTER — Other Ambulatory Visit: Payer: Self-pay | Admitting: Internal Medicine

## 2020-05-04 DIAGNOSIS — E559 Vitamin D deficiency, unspecified: Secondary | ICD-10-CM

## 2020-05-09 ENCOUNTER — Other Ambulatory Visit: Payer: Self-pay

## 2020-05-09 ENCOUNTER — Ambulatory Visit (INDEPENDENT_AMBULATORY_CARE_PROVIDER_SITE_OTHER): Payer: Federal, State, Local not specified - PPO

## 2020-05-09 DIAGNOSIS — E538 Deficiency of other specified B group vitamins: Secondary | ICD-10-CM | POA: Diagnosis not present

## 2020-05-09 DIAGNOSIS — J301 Allergic rhinitis due to pollen: Secondary | ICD-10-CM | POA: Diagnosis not present

## 2020-05-09 DIAGNOSIS — J3089 Other allergic rhinitis: Secondary | ICD-10-CM | POA: Diagnosis not present

## 2020-05-09 MED ORDER — CYANOCOBALAMIN 1000 MCG/ML IJ SOLN
1000.0000 ug | Freq: Once | INTRAMUSCULAR | Status: AC
  Administered 2020-05-09: 1000 ug via INTRAMUSCULAR

## 2020-05-09 NOTE — Patient Instructions (Signed)
Health Maintenance Due  Topic Date Due  . Hepatitis C Screening  Never done  . HIV Screening  Never done  . INFLUENZA VACCINE  03/24/2020    Depression screen [facility] 2/9 04/04/2019 03/31/2018 08/30/2013  Decreased Interest 0 0 0  Down, Depressed, Hopeless 0 0 0  PHQ - 2 Score 0 0 0  Altered sleeping 0 - -  Tired, decreased energy 0 - -  Change in appetite 0 - -  Feeling bad or failure about yourself  0 - -  Trouble concentrating 0 - -  Moving slowly or fidgety/restless 0 - -  Suicidal thoughts 0 - -  PHQ-9 Score 0 - -  Difficult doing work/chores Not difficult at all - -

## 2020-05-09 NOTE — Progress Notes (Signed)
Per orders of [name], Catherine Patricia, MD, injection of B12 given in right deltoid by Sherrin Daisy. Patient tolerated injection well.  Lab Results  Component Value Date   VITAMINB12 195 (L) 04/04/2019

## 2020-05-17 DIAGNOSIS — I10 Essential (primary) hypertension: Secondary | ICD-10-CM | POA: Diagnosis not present

## 2020-05-17 DIAGNOSIS — J3089 Other allergic rhinitis: Secondary | ICD-10-CM | POA: Diagnosis not present

## 2020-05-17 DIAGNOSIS — Z20822 Contact with and (suspected) exposure to covid-19: Secondary | ICD-10-CM | POA: Diagnosis not present

## 2020-05-17 DIAGNOSIS — J4520 Mild intermittent asthma, uncomplicated: Secondary | ICD-10-CM | POA: Diagnosis not present

## 2020-05-17 DIAGNOSIS — Z03818 Encounter for observation for suspected exposure to other biological agents ruled out: Secondary | ICD-10-CM | POA: Diagnosis not present

## 2020-05-23 DIAGNOSIS — J301 Allergic rhinitis due to pollen: Secondary | ICD-10-CM | POA: Diagnosis not present

## 2020-05-23 DIAGNOSIS — J3089 Other allergic rhinitis: Secondary | ICD-10-CM | POA: Diagnosis not present

## 2020-05-23 DIAGNOSIS — J4540 Moderate persistent asthma, uncomplicated: Secondary | ICD-10-CM | POA: Diagnosis not present

## 2020-05-23 DIAGNOSIS — H1045 Other chronic allergic conjunctivitis: Secondary | ICD-10-CM | POA: Diagnosis not present

## 2020-05-28 DIAGNOSIS — J3089 Other allergic rhinitis: Secondary | ICD-10-CM | POA: Diagnosis not present

## 2020-05-28 DIAGNOSIS — J301 Allergic rhinitis due to pollen: Secondary | ICD-10-CM | POA: Diagnosis not present

## 2020-06-03 DIAGNOSIS — J301 Allergic rhinitis due to pollen: Secondary | ICD-10-CM | POA: Diagnosis not present

## 2020-06-03 DIAGNOSIS — J3089 Other allergic rhinitis: Secondary | ICD-10-CM | POA: Diagnosis not present

## 2020-06-12 ENCOUNTER — Ambulatory Visit (INDEPENDENT_AMBULATORY_CARE_PROVIDER_SITE_OTHER): Payer: Federal, State, Local not specified - PPO | Admitting: *Deleted

## 2020-06-12 ENCOUNTER — Other Ambulatory Visit: Payer: Self-pay

## 2020-06-12 DIAGNOSIS — E538 Deficiency of other specified B group vitamins: Secondary | ICD-10-CM | POA: Diagnosis not present

## 2020-06-12 DIAGNOSIS — J301 Allergic rhinitis due to pollen: Secondary | ICD-10-CM | POA: Diagnosis not present

## 2020-06-12 DIAGNOSIS — Z23 Encounter for immunization: Secondary | ICD-10-CM

## 2020-06-12 DIAGNOSIS — J3089 Other allergic rhinitis: Secondary | ICD-10-CM | POA: Diagnosis not present

## 2020-06-12 MED ORDER — CYANOCOBALAMIN 1000 MCG/ML IJ SOLN
1000.0000 ug | Freq: Once | INTRAMUSCULAR | Status: AC
  Administered 2020-06-12: 1000 ug via INTRAMUSCULAR

## 2020-06-12 NOTE — Progress Notes (Signed)
Per orders of Dr. Ardyth Harps, injection of B12 and Influenza  given by Kern Reap. Patient tolerated injection well.

## 2020-06-14 DIAGNOSIS — J3089 Other allergic rhinitis: Secondary | ICD-10-CM | POA: Diagnosis not present

## 2020-06-14 DIAGNOSIS — J301 Allergic rhinitis due to pollen: Secondary | ICD-10-CM | POA: Diagnosis not present

## 2020-06-18 DIAGNOSIS — J301 Allergic rhinitis due to pollen: Secondary | ICD-10-CM | POA: Diagnosis not present

## 2020-06-18 DIAGNOSIS — J3089 Other allergic rhinitis: Secondary | ICD-10-CM | POA: Diagnosis not present

## 2020-06-25 DIAGNOSIS — J301 Allergic rhinitis due to pollen: Secondary | ICD-10-CM | POA: Diagnosis not present

## 2020-06-25 DIAGNOSIS — J3089 Other allergic rhinitis: Secondary | ICD-10-CM | POA: Diagnosis not present

## 2020-07-01 DIAGNOSIS — J301 Allergic rhinitis due to pollen: Secondary | ICD-10-CM | POA: Diagnosis not present

## 2020-07-01 DIAGNOSIS — J3089 Other allergic rhinitis: Secondary | ICD-10-CM | POA: Diagnosis not present

## 2020-07-04 ENCOUNTER — Other Ambulatory Visit: Payer: Self-pay

## 2020-07-04 MED ORDER — CLOBETASOL PROPIONATE 0.05 % EX CREA: TOPICAL_CREAM | CUTANEOUS | 1 refills | Status: DC

## 2020-07-09 DIAGNOSIS — J3089 Other allergic rhinitis: Secondary | ICD-10-CM | POA: Diagnosis not present

## 2020-07-09 DIAGNOSIS — J301 Allergic rhinitis due to pollen: Secondary | ICD-10-CM | POA: Diagnosis not present

## 2020-07-15 ENCOUNTER — Other Ambulatory Visit: Payer: Self-pay

## 2020-07-15 ENCOUNTER — Ambulatory Visit (INDEPENDENT_AMBULATORY_CARE_PROVIDER_SITE_OTHER): Payer: Federal, State, Local not specified - PPO | Admitting: *Deleted

## 2020-07-15 DIAGNOSIS — J3089 Other allergic rhinitis: Secondary | ICD-10-CM | POA: Diagnosis not present

## 2020-07-15 DIAGNOSIS — J301 Allergic rhinitis due to pollen: Secondary | ICD-10-CM | POA: Diagnosis not present

## 2020-07-15 DIAGNOSIS — E538 Deficiency of other specified B group vitamins: Secondary | ICD-10-CM | POA: Diagnosis not present

## 2020-07-15 MED ORDER — CYANOCOBALAMIN 1000 MCG/ML IJ SOLN
1000.0000 ug | Freq: Once | INTRAMUSCULAR | Status: AC
  Administered 2020-07-15: 1000 ug via INTRAMUSCULAR

## 2020-07-15 NOTE — Progress Notes (Signed)
Per orders of Dr. Burchette, injection of B12 given by Pang Robers. Patient tolerated injection well. 

## 2020-07-16 DIAGNOSIS — J301 Allergic rhinitis due to pollen: Secondary | ICD-10-CM | POA: Diagnosis not present

## 2020-07-17 DIAGNOSIS — J3089 Other allergic rhinitis: Secondary | ICD-10-CM | POA: Diagnosis not present

## 2020-07-24 ENCOUNTER — Other Ambulatory Visit: Payer: Self-pay | Admitting: Internal Medicine

## 2020-07-24 DIAGNOSIS — I10 Essential (primary) hypertension: Secondary | ICD-10-CM

## 2020-07-25 DIAGNOSIS — J301 Allergic rhinitis due to pollen: Secondary | ICD-10-CM | POA: Diagnosis not present

## 2020-07-25 DIAGNOSIS — J3089 Other allergic rhinitis: Secondary | ICD-10-CM | POA: Diagnosis not present

## 2020-08-05 ENCOUNTER — Other Ambulatory Visit: Payer: Self-pay

## 2020-08-05 ENCOUNTER — Ambulatory Visit: Payer: Federal, State, Local not specified - PPO | Admitting: Obstetrics and Gynecology

## 2020-08-05 ENCOUNTER — Encounter: Payer: Self-pay | Admitting: Obstetrics and Gynecology

## 2020-08-05 VITALS — BP 124/80 | Ht 65.0 in | Wt 239.0 lb

## 2020-08-05 DIAGNOSIS — R87612 Low grade squamous intraepithelial lesion on cytologic smear of cervix (LGSIL): Secondary | ICD-10-CM | POA: Diagnosis not present

## 2020-08-05 DIAGNOSIS — Z124 Encounter for screening for malignant neoplasm of cervix: Secondary | ICD-10-CM

## 2020-08-05 DIAGNOSIS — N760 Acute vaginitis: Secondary | ICD-10-CM | POA: Diagnosis not present

## 2020-08-05 DIAGNOSIS — Z01419 Encounter for gynecological examination (general) (routine) without abnormal findings: Secondary | ICD-10-CM | POA: Diagnosis not present

## 2020-08-05 MED ORDER — FLUCONAZOLE 150 MG PO TABS: 150.0000 mg | ORAL_TABLET | ORAL | 0 refills | Status: AC

## 2020-08-05 NOTE — Progress Notes (Signed)
Catherine Campbell 06/25/1966 [id number]  SUBJECTIVE:  54 y.o. G0P0000 female for annual routine gynecologic exam and Pap smear.  Treated for sinusitis and has vaginal itching without significant discharge.  She has tried OTC treatments without much relief. She has no other gynecologic concerns.  Current Outpatient Medications  Medication Sig Dispense Refill  . albuterol (VENTOLIN HFA) 108 (90 Base) MCG/ACT inhaler     . azelastine (ASTELIN) 0.1 % nasal spray INSTILL 1 PUFF IN EACH NOSTRIL TWICE A DAY NASALLY 30 DAYS  3  . cetirizine (ZYRTEC) 10 MG tablet Take 10 mg by mouth daily.    . clobetasol cream (TEMOVATE) 0.05 % Apply at bedtime as needed 60 g 1  . EPIPEN 2-PAK 0.3 MG/0.3ML SOAJ injection Reported on 09/24/2015  0  . fexofenadine (ALLEGRA) 180 MG tablet Take 180 mg by mouth daily.    . fluticasone (FLONASE) 50 MCG/ACT nasal spray INSTILL 2 SPRAYS IN EACH NOSTRIL ONCE A DAY NASALLY 30 DAYS  3  . ibuprofen (ADVIL,MOTRIN) 200 MG tablet Take by mouth.    . influenza vac split quadrivalent PF (FLUARIX) 0.5 ML injection ADM 0.5ML IM UTD    . losartan (COZAAR) 100 MG tablet TAKE 1 TABLET BY MOUTH EVERY DAY 90 tablet 1  . montelukast (SINGULAIR) 10 MG tablet Take 10 mg by mouth at bedtime.     Marland Kitchen nystatin-triamcinolone (MYCOLOG II) cream APPLY TO AFFECTED AREA TWICE A DAY 30 g 3  . RESTASIS 0.05 % ophthalmic emulsion INSTILL 1 DROP INTO BOTH EYES TWICE A DAY  3  . SYMBICORT 80-4.5 MCG/ACT inhaler     . UNABLE TO FIND Allergy shots once a week     Current Facility-Administered Medications  Medication Dose Route Frequency Provider Last Rate Last Admin  . 0.9 %  sodium chloride infusion  500 mL Intravenous Once Charlie Pitter III, MD       Allergies: Metaxalone, Penicillins, and Tramadol-acetaminophen  Patient's last menstrual period was 04/05/2020.  Past medical history,surgical history, problem list, medications, allergies, family history and social history were all reviewed and  documented as reviewed in the EPIC chart.  ROS: Pertinent positives and negatives as reviewed in HPI    OBJECTIVE:  BP 124/80   Ht 5\' 5"  (1.651 m)   Wt 239 lb (108.4 kg)   LMP 04/05/2020   BMI 39.77 kg/m  The patient appears well, alert, oriented, in no distress. ENT normal.  Neck supple. No cervical or supraclavicular adenopathy or thyromegaly.  Lungs are clear, good air entry, no wheezes, rhonchi or rales. S1 and S2 normal, no murmurs, regular rate and rhythm.  Abdomen soft without tenderness, guarding, mass or organomegaly.  Neurological is normal, no focal findings.  BREAST EXAM: breasts appear normal, no suspicious masses, no skin or nipple changes or axillary nodes  PELVIC EXAM: VULVA: normal appearing vulva with no masses, tenderness or lesions, VAGINA: normal appearing vagina with normal color and no abnormal discharge, no lesions, CERVIX: normal appearing cervix without discharge or lesions, UTERUS: uterus is normal size, shape, consistency and nontender, ADNEXA: normal adnexa in size, nontender and no masses, PAP: Pap smear done today, thin-prep method  Chaperone: [name] present during the examination  ASSESSMENT:  54 y.o. G0P0000 here for annual gynecologic exam  PLAN:   1. Perimenopausal.  No abnormal/excessive uterine bleeding or significant hot flashes or night sweats.  Will keep [place] posted if having any of the symptoms develop. 2.  Persistent LGSIL Pap smear with positive high-risk  HPV.  Colposcopy 10/2018 was normal with a negative ECC.  Pap smear repeated today. 3.  Vaginitis symptoms.  No discharge noted on the examination today so we did not perform the wet mount.  Will try empirically treating with Diflucan 150 mg x 2 doses, every 72 hours.  Will follow-up if no improvement in symptoms. 4. Mammogram 10/2019.  Normal breast exam today.  She is reminded to schedule an annual mammogram this next year when due. 5. Colonoscopy 2019.  She will follow up at the  recommended interval.   6. Health maintenance.  No labs today as she normally has these completed elsewhere.  Return annually or sooner, prn.  Theresia Majors MD 08/05/20

## 2020-08-07 DIAGNOSIS — J3089 Other allergic rhinitis: Secondary | ICD-10-CM | POA: Diagnosis not present

## 2020-08-07 DIAGNOSIS — J301 Allergic rhinitis due to pollen: Secondary | ICD-10-CM | POA: Diagnosis not present

## 2020-08-07 LAB — PAP IG W/ RFLX HPV ASCU

## 2020-08-14 ENCOUNTER — Other Ambulatory Visit: Payer: Self-pay

## 2020-08-14 ENCOUNTER — Ambulatory Visit (INDEPENDENT_AMBULATORY_CARE_PROVIDER_SITE_OTHER): Payer: Federal, State, Local not specified - PPO | Admitting: *Deleted

## 2020-08-14 DIAGNOSIS — J301 Allergic rhinitis due to pollen: Secondary | ICD-10-CM | POA: Diagnosis not present

## 2020-08-14 DIAGNOSIS — E538 Deficiency of other specified B group vitamins: Secondary | ICD-10-CM | POA: Diagnosis not present

## 2020-08-14 DIAGNOSIS — J3089 Other allergic rhinitis: Secondary | ICD-10-CM | POA: Diagnosis not present

## 2020-08-14 MED ORDER — CYANOCOBALAMIN 1000 MCG/ML IJ SOLN
1000.0000 ug | Freq: Once | INTRAMUSCULAR | Status: AC
  Administered 2020-08-14: 1000 ug via INTRAMUSCULAR

## 2020-08-14 NOTE — Progress Notes (Signed)
Per orders of Dr. Koberlein, injection of Cyanocobalamin 1000mcg given by Facundo Allemand A. Patient tolerated injection well. 

## 2020-08-20 DIAGNOSIS — J3089 Other allergic rhinitis: Secondary | ICD-10-CM | POA: Diagnosis not present

## 2020-08-20 DIAGNOSIS — J301 Allergic rhinitis due to pollen: Secondary | ICD-10-CM | POA: Diagnosis not present

## 2020-08-21 DIAGNOSIS — Z03818 Encounter for observation for suspected exposure to other biological agents ruled out: Secondary | ICD-10-CM | POA: Diagnosis not present

## 2020-08-27 DIAGNOSIS — J3089 Other allergic rhinitis: Secondary | ICD-10-CM | POA: Diagnosis not present

## 2020-08-27 DIAGNOSIS — J301 Allergic rhinitis due to pollen: Secondary | ICD-10-CM | POA: Diagnosis not present

## 2020-08-29 DIAGNOSIS — J3089 Other allergic rhinitis: Secondary | ICD-10-CM | POA: Diagnosis not present

## 2020-08-29 DIAGNOSIS — J301 Allergic rhinitis due to pollen: Secondary | ICD-10-CM | POA: Diagnosis not present

## 2020-09-04 DIAGNOSIS — J3081 Allergic rhinitis due to animal (cat) (dog) hair and dander: Secondary | ICD-10-CM | POA: Diagnosis not present

## 2020-09-04 DIAGNOSIS — J301 Allergic rhinitis due to pollen: Secondary | ICD-10-CM | POA: Diagnosis not present

## 2020-09-04 DIAGNOSIS — J3089 Other allergic rhinitis: Secondary | ICD-10-CM | POA: Diagnosis not present

## 2020-09-11 DIAGNOSIS — H1045 Other chronic allergic conjunctivitis: Secondary | ICD-10-CM | POA: Diagnosis not present

## 2020-09-11 DIAGNOSIS — J301 Allergic rhinitis due to pollen: Secondary | ICD-10-CM | POA: Diagnosis not present

## 2020-09-11 DIAGNOSIS — J4540 Moderate persistent asthma, uncomplicated: Secondary | ICD-10-CM | POA: Diagnosis not present

## 2020-09-11 DIAGNOSIS — J3089 Other allergic rhinitis: Secondary | ICD-10-CM | POA: Diagnosis not present

## 2020-09-16 ENCOUNTER — Other Ambulatory Visit: Payer: Self-pay

## 2020-09-16 ENCOUNTER — Ambulatory Visit (INDEPENDENT_AMBULATORY_CARE_PROVIDER_SITE_OTHER): Payer: Federal, State, Local not specified - PPO | Admitting: *Deleted

## 2020-09-16 DIAGNOSIS — J301 Allergic rhinitis due to pollen: Secondary | ICD-10-CM | POA: Diagnosis not present

## 2020-09-16 DIAGNOSIS — E538 Deficiency of other specified B group vitamins: Secondary | ICD-10-CM

## 2020-09-16 DIAGNOSIS — J3089 Other allergic rhinitis: Secondary | ICD-10-CM | POA: Diagnosis not present

## 2020-09-16 MED ORDER — CYANOCOBALAMIN 1000 MCG/ML IJ SOLN
1000.0000 ug | Freq: Once | INTRAMUSCULAR | Status: AC
  Administered 2020-09-16: 1000 ug via INTRAMUSCULAR

## 2020-09-16 NOTE — Progress Notes (Signed)
Per orders of Dr. Burchette, injection of Cyanocobalamin 1000mcg given by Arjun Hard A. Patient tolerated injection well.  

## 2020-09-17 DIAGNOSIS — H16223 Keratoconjunctivitis sicca, not specified as Sjogren's, bilateral: Secondary | ICD-10-CM | POA: Diagnosis not present

## 2020-09-17 DIAGNOSIS — H40023 Open angle with borderline findings, high risk, bilateral: Secondary | ICD-10-CM | POA: Diagnosis not present

## 2020-09-17 DIAGNOSIS — H10413 Chronic giant papillary conjunctivitis, bilateral: Secondary | ICD-10-CM | POA: Diagnosis not present

## 2020-09-18 DIAGNOSIS — Z20822 Contact with and (suspected) exposure to covid-19: Secondary | ICD-10-CM | POA: Diagnosis not present

## 2020-09-18 DIAGNOSIS — Z03818 Encounter for observation for suspected exposure to other biological agents ruled out: Secondary | ICD-10-CM | POA: Diagnosis not present

## 2020-09-19 DIAGNOSIS — J3081 Allergic rhinitis due to animal (cat) (dog) hair and dander: Secondary | ICD-10-CM | POA: Diagnosis not present

## 2020-09-19 DIAGNOSIS — J301 Allergic rhinitis due to pollen: Secondary | ICD-10-CM | POA: Diagnosis not present

## 2020-09-19 DIAGNOSIS — J3089 Other allergic rhinitis: Secondary | ICD-10-CM | POA: Diagnosis not present

## 2020-09-26 DIAGNOSIS — J3089 Other allergic rhinitis: Secondary | ICD-10-CM | POA: Diagnosis not present

## 2020-09-26 DIAGNOSIS — J301 Allergic rhinitis due to pollen: Secondary | ICD-10-CM | POA: Diagnosis not present

## 2020-09-26 DIAGNOSIS — Z03818 Encounter for observation for suspected exposure to other biological agents ruled out: Secondary | ICD-10-CM | POA: Diagnosis not present

## 2020-09-26 DIAGNOSIS — Z20822 Contact with and (suspected) exposure to covid-19: Secondary | ICD-10-CM | POA: Diagnosis not present

## 2020-09-26 DIAGNOSIS — J3081 Allergic rhinitis due to animal (cat) (dog) hair and dander: Secondary | ICD-10-CM | POA: Diagnosis not present

## 2020-10-02 DIAGNOSIS — J3089 Other allergic rhinitis: Secondary | ICD-10-CM | POA: Diagnosis not present

## 2020-10-02 DIAGNOSIS — J301 Allergic rhinitis due to pollen: Secondary | ICD-10-CM | POA: Diagnosis not present

## 2020-10-09 DIAGNOSIS — J3089 Other allergic rhinitis: Secondary | ICD-10-CM | POA: Diagnosis not present

## 2020-10-09 DIAGNOSIS — J301 Allergic rhinitis due to pollen: Secondary | ICD-10-CM | POA: Diagnosis not present

## 2020-10-17 ENCOUNTER — Other Ambulatory Visit: Payer: Self-pay

## 2020-10-17 DIAGNOSIS — Z03818 Encounter for observation for suspected exposure to other biological agents ruled out: Secondary | ICD-10-CM | POA: Diagnosis not present

## 2020-10-18 ENCOUNTER — Encounter: Payer: Self-pay | Admitting: Internal Medicine

## 2020-10-18 ENCOUNTER — Ambulatory Visit (INDEPENDENT_AMBULATORY_CARE_PROVIDER_SITE_OTHER): Payer: Federal, State, Local not specified - PPO | Admitting: Internal Medicine

## 2020-10-18 ENCOUNTER — Other Ambulatory Visit: Payer: Self-pay

## 2020-10-18 ENCOUNTER — Other Ambulatory Visit: Payer: Self-pay | Admitting: Internal Medicine

## 2020-10-18 VITALS — BP 120/84 | HR 63 | Temp 98.2°F | Ht 64.75 in | Wt 239.3 lb

## 2020-10-18 DIAGNOSIS — E785 Hyperlipidemia, unspecified: Secondary | ICD-10-CM

## 2020-10-18 DIAGNOSIS — E559 Vitamin D deficiency, unspecified: Secondary | ICD-10-CM

## 2020-10-18 DIAGNOSIS — E538 Deficiency of other specified B group vitamins: Secondary | ICD-10-CM | POA: Diagnosis not present

## 2020-10-18 DIAGNOSIS — I10 Essential (primary) hypertension: Secondary | ICD-10-CM

## 2020-10-18 DIAGNOSIS — J3081 Allergic rhinitis due to animal (cat) (dog) hair and dander: Secondary | ICD-10-CM | POA: Diagnosis not present

## 2020-10-18 DIAGNOSIS — J3089 Other allergic rhinitis: Secondary | ICD-10-CM | POA: Diagnosis not present

## 2020-10-18 DIAGNOSIS — Z0000 Encounter for general adult medical examination without abnormal findings: Secondary | ICD-10-CM

## 2020-10-18 DIAGNOSIS — J301 Allergic rhinitis due to pollen: Secondary | ICD-10-CM | POA: Diagnosis not present

## 2020-10-18 DIAGNOSIS — J4520 Mild intermittent asthma, uncomplicated: Secondary | ICD-10-CM

## 2020-10-18 LAB — LIPID PANEL
Cholesterol: 227 mg/dL — ABNORMAL HIGH (ref 0–200)
HDL: 60.6 mg/dL (ref >39.00)
LDL Cholesterol: 147 mg/dL — ABNORMAL HIGH (ref 0–99)
NonHDL: 166.07
Total CHOL/HDL Ratio: 4
Triglycerides: 95 mg/dL (ref 0.0–149.0)
VLDL: 19 mg/dL (ref 0.0–40.0)

## 2020-10-18 LAB — COMPREHENSIVE METABOLIC PANEL
ALT: 12 U/L (ref 0–35)
AST: 14 U/L (ref 0–37)
Albumin: 4 g/dL (ref 3.5–5.2)
Alkaline Phosphatase: 94 U/L (ref 39–117)
BUN: 13 mg/dL (ref 6–23)
CO2: 31 mEq/L (ref 19–32)
Calcium: 9.5 mg/dL (ref 8.4–10.5)
Chloride: 104 mEq/L (ref 96–112)
Creatinine, Ser: 0.96 mg/dL (ref 0.40–1.20)
GFR: 67.51 mL/min (ref >60.00)
Glucose, Bld: 83 mg/dL (ref 70–99)
Potassium: 4.3 mEq/L (ref 3.5–5.1)
Sodium: 142 mEq/L (ref 135–145)
Total Bilirubin: 0.4 mg/dL (ref 0.2–1.2)
Total Protein: 7.7 g/dL (ref 6.0–8.3)

## 2020-10-18 LAB — CBC WITH DIFFERENTIAL/PLATELET
Basophils Absolute: 0 10*3/uL (ref 0.0–0.1)
Basophils Relative: 0.6 % (ref 0.0–3.0)
Eosinophils Absolute: 0.2 10*3/uL (ref 0.0–0.7)
Eosinophils Relative: 3.5 % (ref 0.0–5.0)
HCT: 36 % (ref 36.0–46.0)
Hemoglobin: 11.9 g/dL — ABNORMAL LOW (ref 12.0–15.0)
Lymphocytes Relative: 24.7 % (ref 12.0–46.0)
Lymphs Abs: 1.3 10*3/uL (ref 0.7–4.0)
MCHC: 33.1 g/dL (ref 30.0–36.0)
MCV: 86.7 fl (ref 78.0–100.0)
Monocytes Absolute: 0.4 10*3/uL (ref 0.1–1.0)
Monocytes Relative: 6.8 % (ref 3.0–12.0)
Neutro Abs: 3.5 10*3/uL (ref 1.4–7.7)
Neutrophils Relative %: 64.4 % (ref 43.0–77.0)
Platelets: 312 10*3/uL (ref 150.0–400.0)
RBC: 4.15 Mil/uL (ref 3.87–5.11)
RDW: 14.4 % (ref 11.5–15.5)
WBC: 5.5 10*3/uL (ref 4.0–10.5)

## 2020-10-18 LAB — VITAMIN B12: Vitamin B-12: >1506 pg/mL — ABNORMAL HIGH (ref 211–911)

## 2020-10-18 LAB — TSH: TSH: 1.93 u[IU]/mL (ref 0.35–4.50)

## 2020-10-18 LAB — HEMOGLOBIN A1C: Hgb A1c MFr Bld: 5.5 % (ref 4.6–6.5)

## 2020-10-18 LAB — VITAMIN D 25 HYDROXY (VIT D DEFICIENCY, FRACTURES): VITD: 23.24 ng/mL — ABNORMAL LOW (ref 30.00–100.00)

## 2020-10-18 MED ORDER — CYANOCOBALAMIN 1000 MCG/ML IJ SOLN
1000.0000 ug | Freq: Once | INTRAMUSCULAR | Status: AC
  Administered 2020-10-18: 1000 ug via INTRAMUSCULAR

## 2020-10-18 MED ORDER — ATORVASTATIN CALCIUM 20 MG PO TABS: 20.0000 mg | ORAL_TABLET | Freq: Every day | ORAL | 1 refills | Status: DC

## 2020-10-18 MED ORDER — VITAMIN D (ERGOCALCIFEROL) 1.25 MG (50000 UNIT) PO CAPS: 50000.0000 [IU] | ORAL_CAPSULE | ORAL | 0 refills | Status: AC

## 2020-10-18 NOTE — Patient Instructions (Signed)
-Nice seeing you today!!  -Lab work today; will notify you once results are available.  -B12 injection today.  -Schedule follow up in 6 months.   Preventive Care 11-54 Years Old, Female Preventive care refers to lifestyle choices and visits with your health care provider that can promote health and wellness. This includes:  A yearly physical exam. This is also called an annual wellness visit.  Regular dental and eye exams.  Immunizations.  Screening for certain conditions.  Healthy lifestyle choices, such as: ? Eating a healthy diet. ? Getting regular exercise. ? Not using drugs or products that contain nicotine and tobacco. ? Limiting alcohol use. What can I expect for my preventive care visit? Physical exam Your health care provider will check your:  Height and weight. These may be used to calculate your BMI (body mass index). BMI is a measurement that tells if you are at a healthy weight.  Heart rate and blood pressure.  Body temperature.  Skin for abnormal spots. Counseling Your health care provider may ask you questions about your:  Past medical problems.  Family's medical history.  Alcohol, tobacco, and drug use.  Emotional well-being.  Home life and relationship well-being.  Sexual activity.  Diet, exercise, and sleep habits.  Work and work Astronomer.  Access to firearms.  Method of birth control.  Menstrual cycle.  Pregnancy history. What immunizations do I need? Vaccines are usually given at various ages, according to a schedule. Your health care provider will recommend vaccines for you based on your age, medical history, and lifestyle or other factors, such as travel or where you work.   What tests do I need? Blood tests  Lipid and cholesterol levels. These may be checked every 5 years, or more often if you are over 88 years old.  Hepatitis C test.  Hepatitis B test. Screening  Lung cancer screening. You may have this screening  every year starting at age 31 if you have a 30-pack-year history of smoking and currently smoke or have quit within the past 15 years.  Colorectal cancer screening. ? All adults should have this screening starting at age 70 and continuing until age 12. ? Your health care provider may recommend screening at age 80 if you are at increased risk. ? You will have tests every 1-10 years, depending on your results and the type of screening test.  Diabetes screening. ? This is done by checking your blood sugar (glucose) after you have not eaten for a while (fasting). ? You may have this done every 1-3 years.  Mammogram. ? This may be done every 1-2 years. ? Talk with your health care provider about when you should start having regular mammograms. This may depend on whether you have a family history of breast cancer.  BRCA-related cancer screening. This may be done if you have a family history of breast, ovarian, tubal, or peritoneal cancers.  Pelvic exam and Pap test. ? This may be done every 3 years starting at age 61. ? Starting at age 5, this may be done every 5 years if you have a Pap test in combination with an HPV test. Other tests  STD (sexually transmitted disease) testing, if you are at risk.  Bone density scan. This is done to screen for osteoporosis. You may have this scan if you are at high risk for osteoporosis. Talk with your health care provider about your test results, treatment options, and if necessary, the need for more tests. Follow these instructions at  home: Eating and drinking  Eat a diet that includes fresh fruits and vegetables, whole grains, lean protein, and low-fat dairy products.  Take vitamin and mineral supplements as recommended by your health care provider.  Do not drink alcohol if: ? Your health care provider tells you not to drink. ? You are pregnant, may be pregnant, or are planning to become pregnant.  If you drink alcohol: ? Limit how much you have  to 0-1 drink a day. ? Be aware of how much alcohol is in your drink. In the [place] one drink equals one 12 oz bottle of beer (355 mL), one 5 oz glass of wine (148 mL), or one 1 oz glass of hard liquor (44 mL).   Lifestyle  Take daily care of your teeth and gums. Brush your teeth every morning and night with fluoride toothpaste. Floss one time each day.  Stay active. Exercise for at least 30 minutes 5 or more days each week.  Do not use any products that contain nicotine or tobacco, such as cigarettes, e-cigarettes, and chewing tobacco. If you need help quitting, ask your health care provider.  Do not use drugs.  If you are sexually active, practice safe sex. Use a condom or other form of protection to prevent STIs (sexually transmitted infections).  If you do not wish to become pregnant, use a form of birth control. If you plan to become pregnant, see your health care provider for a prepregnancy visit.  If told by your health care provider, take low-dose aspirin daily starting at age 60.  Find healthy ways to cope with stress, such as: ? Meditation, yoga, or listening to music. ? Journaling. ? Talking to a trusted person. ? Spending time with friends and family. Safety  Always wear your seat belt while driving or riding in a vehicle.  Do not drive: ? If you have been drinking alcohol. Do not ride with someone who has been drinking. ? When you are tired or distracted. ? While texting.  Wear a helmet and other protective equipment during sports activities.  If you have firearms in your house, make sure you follow all gun safety procedures. What's next?  Visit your health care provider once a year for an annual wellness visit.  Ask your health care provider how often you should have your eyes and teeth checked.  Stay up to date on all vaccines. This information is not intended to replace advice given to you by your health care provider. Make sure you discuss any questions you  have with your health care provider. Document Revised: 05/14/2020 Document Reviewed: 04/21/2018 Elsevier Patient Education  2021 [employer].

## 2020-10-18 NOTE — Addendum Note (Signed)
Addended by: Evert Kohl D on: 10/18/2020 10:19 AM   Modules accepted: Orders

## 2020-10-18 NOTE — Addendum Note (Signed)
Addended by: Christy Sartorius on: 10/18/2020 03:47 PM   Modules accepted: Orders

## 2020-10-18 NOTE — Progress Notes (Signed)
Established Patient Office Visit     This visit occurred during the SARS-CoV-2 public health emergency.  Safety protocols were in place, including screening questions prior to the visit, additional usage of staff PPE, and extensive cleaning of exam room while observing appropriate contact time as indicated for disinfecting solutions.    CC/Reason for Visit: Annual preventive exam  HPI: Catherine Campbell is a 55 y.o. female who is coming in today for the above mentioned reasons. Past Medical History is significant for:  hypertension that has been well controlled, hyperlipidemia not on medications, vitamin B12 by monthly IM supplementation due for another injection today, also history of vitamin D deficiency. She also has a history of seasonal allergies and asthma and gets allergy shots.  She has no acute complaints today.  She has routine eye and dental care.  She has been walking 3-4 times a week as exercise.  She had a colonoscopy in 2019, she had her Pap smear with her gynecologist in December 2021.  Her mammogram is scheduled for next month.   Past Medical/Surgical History: Past Medical History:  Diagnosis Date  . Allergy   . ASCUS with positive high risk human papillomavirus of vagina 06/2013, 12/2016   Colposcopic biopsy koilocytotic atypia.  . Asthma   . Hypertension   . Low grade squamous intraepithelial lesion (LGSIL) 06/2014, 06/2015, 06/2016, 06/2017, 12/2017, 07/2018   positive high-risk HPV. Colposcopic biopsy LGSIL negative E[facility] 2015,  ECC 2017 with koilocytotic atypia changes    Past Surgical History:  Procedure Laterality Date  . BOIL UNDER ARM      Social History:  reports that she has never smoked. She has never used smokeless tobacco. She reports that she does not drink alcohol and does not use drugs.  Allergies: Allergies  Allergen Reactions  . Metaxalone Hives  . Penicillins Hives  . Tramadol-Acetaminophen Hives    Family History:  Family History   Problem Relation Age of Onset  . Diabetes Father   . Hypertension Father   . Colon polyps Father   . Hypertension Mother   . Cancer Mother        Pancreatic  . Colon polyps Mother   . Rectal cancer Neg Hx   . Stomach cancer Neg Hx   . Colon cancer Neg Hx      Current Outpatient Medications:  .  albuterol (VENTOLIN HFA) 108 (90 Base) MCG/ACT inhaler, , Disp: , Rfl:  .  azelastine (ASTELIN) 0.1 % nasal spray, INSTILL 1 PUFF IN EACH NOSTRIL TWICE A DAY NASALLY 30 DAYS, Disp: , Rfl: 3 .  cetirizine (ZYRTEC) 10 MG tablet, Take 10 mg by mouth daily., Disp: , Rfl:  .  clobetasol cream (TEMOVATE) 0.05 %, Apply at bedtime as needed, Disp: 60 g, Rfl: 1 .  EPIPEN 2-PAK 0.3 MG/0.3ML SOAJ injection, Reported on 09/24/2015, Disp: , Rfl: 0 .  fexofenadine (ALLEGRA) 180 MG tablet, Take 180 mg by mouth daily., Disp: , Rfl:  .  fluticasone (FLONASE) 50 MCG/ACT nasal spray, INSTILL 2 SPRAYS IN EACH NOSTRIL ONCE A DAY NASALLY 30 DAYS, Disp: , Rfl: 3 .  ibuprofen (ADVIL,MOTRIN) 200 MG tablet, Take by mouth., Disp: , Rfl:  .  losartan (COZAAR) 100 MG tablet, TAKE 1 TABLET BY MOUTH EVERY DAY, Disp: 90 tablet, Rfl: 1 .  montelukast (SINGULAIR) 10 MG tablet, Take 10 mg by mouth at bedtime. , Disp: , Rfl:  .  RESTASIS 0.05 % ophthalmic emulsion, INSTILL 1 DROP INTO BOTH EYES  TWICE A DAY, Disp: , Rfl: 3 .  SYMBICORT 80-4.5 MCG/ACT inhaler, , Disp: , Rfl:  .  TYRVAYA 0.03 MG/ACT SOLN, Used for dry eye, Disp: , Rfl:  .  UNABLE TO FIND, Allergy shots once a week, Disp: , Rfl:   Current Facility-Administered Medications:  .  0.9 %  sodium chloride infusion, 500 mL, Intravenous, Once, Danis, Estill Cotta III, MD  Review of Systems:  Constitutional: Denies fever, chills, diaphoresis, appetite change and fatigue.  HEENT: Denies photophobia, eye pain, redness, hearing loss, ear pain, congestion, sore throat, rhinorrhea, sneezing, mouth sores, trouble swallowing, neck pain, neck stiffness and tinnitus.   Respiratory:  Denies SOB, DOE, cough, chest tightness,  and wheezing.   Cardiovascular: Denies chest pain, palpitations and leg swelling.  Gastrointestinal: Denies nausea, vomiting, abdominal pain, diarrhea, constipation, blood in stool and abdominal distention.  Genitourinary: Denies dysuria, urgency, frequency, hematuria, flank pain and difficulty urinating.  Endocrine: Denies: hot or cold intolerance, sweats, changes in hair or nails, polyuria, polydipsia. Musculoskeletal: Denies myalgias, back pain, joint swelling, arthralgias and gait problem.  Skin: Denies pallor, rash and wound.  Neurological: Denies dizziness, seizures, syncope, weakness, light-headedness, numbness and headaches.  Hematological: Denies adenopathy. Easy bruising, personal or family bleeding history  Psychiatric/Behavioral: Denies suicidal ideation, mood changes, confusion, nervousness, sleep disturbance and agitation    Physical Exam: Vitals:   10/18/20 0933  BP: 120/84  Pulse: 63  Temp: 98.2 F (36.8 C)  TempSrc: Oral  SpO2: 98%  Weight: 239 lb 4.8 oz (108.5 kg)  Height: 5' 4.75" (1.645 m)    Body mass index is 40.13 kg/m.   Constitutional: NAD, calm, comfortable, obese Eyes: PERRL, lids and conjunctivae normal, wears corrective lenses ENMT: Mucous membranes are moist. Posterior pharynx clear of any exudate or lesions. Normal dentition. Tympanic membrane is pearly white, no erythema or bulging. Neck: normal, supple, no masses, no thyromegaly Respiratory: clear to auscultation bilaterally, no wheezing, no crackles. Normal respiratory effort. No accessory muscle use.  Cardiovascular: Regular rate and rhythm, no murmurs / rubs / gallops. No extremity edema. 2+ pedal pulses.  Abdomen: no tenderness, no masses palpated. No hepatosplenomegaly. Bowel sounds positive.  Musculoskeletal: no clubbing / cyanosis. No joint deformity upper and lower extremities. Good ROM, no contractures. Normal muscle tone.  Skin: no rashes,  lesions, ulcers. No induration Neurologic: CN 2-12 grossly intact. Sensation intact, DTR normal. Strength 5/5 in all 4.  Psychiatric: Normal judgment and insight. Alert and oriented x 3. Normal mood.    Impression and Plan:  Encounter for preventive health examination  -She has routine eye and dental care. -All immunizations are up-to-date including COVID and influenza. -Screening labs today. -Healthy lifestyle discussed in detail. -Pap smear and colonoscopy are up-to-date, she is scheduled for mammogram next month.  Morbid obesity ([place]) -Discussed healthy lifestyle, including increased physical activity and better food choices to promote weight loss.  Vitamin D deficiency  - Plan: VITAMIN D 25 Hydroxy (Vit-D Deficiency, Fractures)  Vitamin B12 deficiency  - Plan: Vitamin B12 -She will receive B12 injection today.  Essential hypertension  -Well-controlled on losartan. Hyperlipidemia, unspecified hyperlipidemia type  - Plan: Lipid panel -Last LDL was 140 in 2020, if still elevated consider low-dose statin.  Mild intermittent chronic asthma without complication -Stable, follows with the asthma center.    Patient Instructions   -Nice seeing you today!!  -Lab work today; will notify you once results are available.  -B12 injection today.  -Schedule follow up in 6 months.   Preventive  Care 36-65 Years Old, Female Preventive care refers to lifestyle choices and visits with your health care provider that can promote health and wellness. This includes:  A yearly physical exam. This is also called an annual wellness visit.  Regular dental and eye exams.  Immunizations.  Screening for certain conditions.  Healthy lifestyle choices, such as: ? Eating a healthy diet. ? Getting regular exercise. ? Not using drugs or products that contain nicotine and tobacco. ? Limiting alcohol use. What can I expect for my preventive care visit? Physical exam Your health care  provider will check your:  Height and weight. These may be used to calculate your BMI (body mass index). BMI is a measurement that tells if you are at a healthy weight.  Heart rate and blood pressure.  Body temperature.  Skin for abnormal spots. Counseling Your health care provider may ask you questions about your:  Past medical problems.  Family's medical history.  Alcohol, tobacco, and drug use.  Emotional well-being.  Home life and relationship well-being.  Sexual activity.  Diet, exercise, and sleep habits.  Work and work Statistician.  Access to firearms.  Method of birth control.  Menstrual cycle.  Pregnancy history. What immunizations do I need? Vaccines are usually given at various ages, according to a schedule. Your health care provider will recommend vaccines for you based on your age, medical history, and lifestyle or other factors, such as travel or where you work.   What tests do I need? Blood tests  Lipid and cholesterol levels. These may be checked every 5 years, or more often if you are over 22 years old.  Hepatitis C test.  Hepatitis B test. Screening  Lung cancer screening. You may have this screening every year starting at age 41 if you have a 30-pack-year history of smoking and currently smoke or have quit within the past 15 years.  Colorectal cancer screening. ? All adults should have this screening starting at age 22 and continuing until age 71. ? Your health care provider may recommend screening at age 36 if you are at increased risk. ? You will have tests every 1-10 years, depending on your results and the type of screening test.  Diabetes screening. ? This is done by checking your blood sugar (glucose) after you have not eaten for a while (fasting). ? You may have this done every 1-3 years.  Mammogram. ? This may be done every 1-2 years. ? Talk with your health care provider about when you should start having regular mammograms. This  may depend on whether you have a family history of breast cancer.  BRCA-related cancer screening. This may be done if you have a family history of breast, ovarian, tubal, or peritoneal cancers.  Pelvic exam and Pap test. ? This may be done every 3 years starting at age 77. ? Starting at age 70, this may be done every 5 years if you have a Pap test in combination with an HPV test. Other tests  STD (sexually transmitted disease) testing, if you are at risk.  Bone density scan. This is done to screen for osteoporosis. You may have this scan if you are at high risk for osteoporosis. Talk with your health care provider about your test results, treatment options, and if necessary, the need for more tests. Follow these instructions at home: Eating and drinking  Eat a diet that includes fresh fruits and vegetables, whole grains, lean protein, and low-fat dairy products.  Take vitamin and mineral supplements  as recommended by your health care provider.  Do not drink alcohol if: ? Your health care provider tells you not to drink. ? You are pregnant, may be pregnant, or are planning to become pregnant.  If you drink alcohol: ? Limit how much you have to 0-1 drink a day. ? Be aware of how much alcohol is in your drink. In the [place] one drink equals one 12 oz bottle of beer (355 mL), one 5 oz glass of wine (148 mL), or one 1 oz glass of hard liquor (44 mL).   Lifestyle  Take daily care of your teeth and gums. Brush your teeth every morning and night with fluoride toothpaste. Floss one time each day.  Stay active. Exercise for at least 30 minutes 5 or more days each week.  Do not use any products that contain nicotine or tobacco, such as cigarettes, e-cigarettes, and chewing tobacco. If you need help quitting, ask your health care provider.  Do not use drugs.  If you are sexually active, practice safe sex. Use a condom or other form of protection to prevent STIs (sexually transmitted  infections).  If you do not wish to become pregnant, use a form of birth control. If you plan to become pregnant, see your health care provider for a prepregnancy visit.  If told by your health care provider, take low-dose aspirin daily starting at age 39.  Find healthy ways to cope with stress, such as: ? Meditation, yoga, or listening to music. ? Journaling. ? Talking to a trusted person. ? Spending time with friends and family. Safety  Always wear your seat belt while driving or riding in a vehicle.  Do not drive: ? If you have been drinking alcohol. Do not ride with someone who has been drinking. ? When you are tired or distracted. ? While texting.  Wear a helmet and other protective equipment during sports activities.  If you have firearms in your house, make sure you follow all gun safety procedures. What's next?  Visit your health care provider once a year for an annual wellness visit.  Ask your health care provider how often you should have your eyes and teeth checked.  Stay up to date on all vaccines. This information is not intended to replace advice given to you by your health care provider. Make sure you discuss any questions you have with your health care provider. Document Revised: 05/14/2020 Document Reviewed: 04/21/2018 Elsevier Patient Education  2021 [place], MD [facility]

## 2020-10-22 DIAGNOSIS — J301 Allergic rhinitis due to pollen: Secondary | ICD-10-CM | POA: Diagnosis not present

## 2020-10-22 DIAGNOSIS — J3089 Other allergic rhinitis: Secondary | ICD-10-CM | POA: Diagnosis not present

## 2020-10-29 DIAGNOSIS — J3089 Other allergic rhinitis: Secondary | ICD-10-CM | POA: Diagnosis not present

## 2020-10-29 DIAGNOSIS — J301 Allergic rhinitis due to pollen: Secondary | ICD-10-CM | POA: Diagnosis not present

## 2020-11-05 DIAGNOSIS — Z1231 Encounter for screening mammogram for malignant neoplasm of breast: Secondary | ICD-10-CM | POA: Diagnosis not present

## 2020-11-05 LAB — HM MAMMOGRAPHY

## 2020-11-07 DIAGNOSIS — J3089 Other allergic rhinitis: Secondary | ICD-10-CM | POA: Diagnosis not present

## 2020-11-07 DIAGNOSIS — J301 Allergic rhinitis due to pollen: Secondary | ICD-10-CM | POA: Diagnosis not present

## 2020-11-07 DIAGNOSIS — J3081 Allergic rhinitis due to animal (cat) (dog) hair and dander: Secondary | ICD-10-CM | POA: Diagnosis not present

## 2020-11-15 ENCOUNTER — Other Ambulatory Visit: Payer: Self-pay

## 2020-11-15 ENCOUNTER — Ambulatory Visit (INDEPENDENT_AMBULATORY_CARE_PROVIDER_SITE_OTHER): Payer: Federal, State, Local not specified - PPO

## 2020-11-15 DIAGNOSIS — J301 Allergic rhinitis due to pollen: Secondary | ICD-10-CM | POA: Diagnosis not present

## 2020-11-15 DIAGNOSIS — J3089 Other allergic rhinitis: Secondary | ICD-10-CM | POA: Diagnosis not present

## 2020-11-15 DIAGNOSIS — E538 Deficiency of other specified B group vitamins: Secondary | ICD-10-CM | POA: Diagnosis not present

## 2020-11-15 MED ORDER — CYANOCOBALAMIN 1000 MCG/ML IJ SOLN
1000.0000 ug | Freq: Once | INTRAMUSCULAR | Status: AC
  Administered 2020-11-15: 1000 ug via INTRAMUSCULAR

## 2020-11-15 NOTE — Progress Notes (Signed)
Per orders of Dr. Ardyth Harps , injection of Cyanocobalamin 1,000 mg/mL given by Carola Rhine. Patient tolerated injection well.

## 2020-11-21 DIAGNOSIS — J301 Allergic rhinitis due to pollen: Secondary | ICD-10-CM | POA: Diagnosis not present

## 2020-11-21 DIAGNOSIS — J3089 Other allergic rhinitis: Secondary | ICD-10-CM | POA: Diagnosis not present

## 2020-11-29 DIAGNOSIS — J301 Allergic rhinitis due to pollen: Secondary | ICD-10-CM | POA: Diagnosis not present

## 2020-11-29 DIAGNOSIS — J3089 Other allergic rhinitis: Secondary | ICD-10-CM | POA: Diagnosis not present

## 2020-12-05 DIAGNOSIS — J301 Allergic rhinitis due to pollen: Secondary | ICD-10-CM | POA: Diagnosis not present

## 2020-12-05 DIAGNOSIS — J3089 Other allergic rhinitis: Secondary | ICD-10-CM | POA: Diagnosis not present

## 2020-12-13 DIAGNOSIS — J3089 Other allergic rhinitis: Secondary | ICD-10-CM | POA: Diagnosis not present

## 2020-12-13 DIAGNOSIS — J301 Allergic rhinitis due to pollen: Secondary | ICD-10-CM | POA: Diagnosis not present

## 2020-12-19 DIAGNOSIS — J3089 Other allergic rhinitis: Secondary | ICD-10-CM | POA: Diagnosis not present

## 2020-12-19 DIAGNOSIS — J301 Allergic rhinitis due to pollen: Secondary | ICD-10-CM | POA: Diagnosis not present

## 2020-12-23 DIAGNOSIS — J0140 Acute pansinusitis, unspecified: Secondary | ICD-10-CM | POA: Diagnosis not present

## 2020-12-24 ENCOUNTER — Ambulatory Visit (INDEPENDENT_AMBULATORY_CARE_PROVIDER_SITE_OTHER): Payer: Federal, State, Local not specified - PPO | Admitting: *Deleted

## 2020-12-24 ENCOUNTER — Other Ambulatory Visit: Payer: Self-pay

## 2020-12-24 DIAGNOSIS — E538 Deficiency of other specified B group vitamins: Secondary | ICD-10-CM

## 2020-12-24 DIAGNOSIS — J301 Allergic rhinitis due to pollen: Secondary | ICD-10-CM | POA: Diagnosis not present

## 2020-12-24 DIAGNOSIS — J3089 Other allergic rhinitis: Secondary | ICD-10-CM | POA: Diagnosis not present

## 2020-12-24 MED ORDER — CYANOCOBALAMIN 1000 MCG/ML IJ SOLN
1000.0000 ug | Freq: Once | INTRAMUSCULAR | Status: AC
  Administered 2020-12-24: 1000 ug via INTRAMUSCULAR

## 2020-12-24 NOTE — Progress Notes (Signed)
Per orders of Dr. Hernandez, injection of B12 given by Louise Rawson. Patient tolerated injection well.  

## 2020-12-30 ENCOUNTER — Other Ambulatory Visit: Payer: Self-pay

## 2020-12-30 ENCOUNTER — Emergency Department
Admission: RE | Admit: 2020-12-30 | Discharge: 2020-12-30 | Disposition: A | Discharge status: Home / Self Care | Payer: Federal, State, Local not specified - PPO | Source: Ambulatory Visit

## 2020-12-30 VITALS — BP 138/86 | HR 101 | Temp 98.5°F | Resp 16

## 2020-12-30 DIAGNOSIS — T50905A Adverse effect of unspecified drugs, medicaments and biological substances, initial encounter: Secondary | ICD-10-CM | POA: Diagnosis not present

## 2020-12-30 DIAGNOSIS — R21 Rash and other nonspecific skin eruption: Secondary | ICD-10-CM

## 2020-12-30 MED ORDER — METHYLPREDNISOLONE ACETATE 80 MG/ML IJ SUSP
80.0000 mg | Freq: Once | INTRAMUSCULAR | Status: AC
  Administered 2020-12-30: 80 mg via INTRAMUSCULAR

## 2020-12-30 MED ORDER — METHYLPREDNISOLONE 4 MG PO TBPK: ORAL_TABLET | ORAL | 0 refills | Status: AC

## 2020-12-30 NOTE — ED Provider Notes (Signed)
Female Catherine Campbell CARE    CSN: [id number] Arrival date & time: 12/30/20  1606      History   Chief Complaint Chief Complaint  Patient presents with  . Cellulitis  . Appointment    HPI Catherine Campbell is a 55 y.o. female.   HPI   55 year old female presents with cellulitis of feet for 5 days.  Reports that she was started on doxycycline last week for an right ear infection woke up the next morning and had blisters on her right thumb and right foot.  She continues taking doxycycline until today.  She reports blisters of right thumb and right foot began after first dose of doxycycline. Reports taking 10 of 14 100 mg tablets since Wednesday, 12/25/20.  Past Medical History:  Diagnosis Date  . Allergy   . ASCUS with positive high risk human papillomavirus of vagina 06/2013, 12/2016   Colposcopic biopsy koilocytotic atypia.  . Asthma   . Hypertension   . Low grade squamous intraepithelial lesion (LGSIL) 06/2014, 06/2015, 06/2016, 06/2017, 12/2017, 07/2018   positive high-risk HPV. Colposcopic biopsy LGSIL negative ECC 2015,  ECC 2017 with koilocytotic atypia changes    Patient Active Problem List   Diagnosis Date Noted  . Morbid obesity ([facility]) 02/06/2020  . Vitamin D deficiency 04/04/2019  . Vitamin B12 deficiency 04/04/2019  . Seasonal allergies 03/31/2018  . Asthma, chronic 08/30/2013  . RECTAL BLEEDING 07/30/2010  . Hyperlipidemia 12/06/2009  . Allergic rhinitis 06/03/2009  . VAGINITIS 01/09/2008  . Acute upper respiratory infection 09/12/2007  . Essential hypertension 05/24/2007    Past Surgical History:  Procedure Laterality Date  . BOIL UNDER ARM      OB History    Gravida  0   Para  0   Term  0   Preterm  0   AB  0   Living  0     SAB  0   IAB  0   Ectopic  0   Multiple  0   Live Births  0            Home Medications    Prior to Admission medications   Medication Sig Start Date End Date Taking? Authorizing Provider  albuterol  (VENTOLIN HFA) 108 (90 Base) MCG/ACT inhaler  04/02/17  Yes [provider]  atorvastatin (LIPITOR) 20 MG tablet Take 1 tablet (20 mg total) by mouth daily. 10/18/20  Yes [name], Limmie Patricia, MD  azelastine (ASTELIN) 0.1 % nasal spray INSTILL 1 PUFF IN EACH NOSTRIL TWICE A DAY NASALLY 30 DAYS 03/29/16  Yes [provider]  cetirizine (ZYRTEC) 10 MG tablet Take 10 mg by mouth daily.   Yes [provider]  fexofenadine (ALLEGRA) 180 MG tablet Take 180 mg by mouth daily.   Yes [provider]  fluticasone (FLONASE) 50 MCG/ACT nasal spray INSTILL 2 SPRAYS IN EACH NOSTRIL ONCE A DAY NASALLY 30 DAYS 03/29/16  Yes [provider]  losartan (COZAAR) 100 MG tablet TAKE 1 TABLET BY MOUTH EVERY DAY 07/24/20  Yes [name], Limmie Patricia, MD  methylPREDNISolone (MEDROL DOSEPAK) 4 MG TBPK tablet Use as directed 12/30/20 01/04/21 Yes Trevor Iha, FNP  montelukast (SINGULAIR) 10 MG tablet Take 10 mg by mouth at bedtime.  04/30/13  Yes [provider]  RESTASIS 0.05 % ophthalmic emulsion INSTILL 1 DROP INTO BOTH EYES TWICE A DAY 02/25/18  Yes [provider]  SYMBICORT 80-4.5 MCG/ACT inhaler  02/10/18  Yes [provider]  Jamse Arn  0.03 MG/ACT SOLN Used for dry eye 09/17/20  Yes [provider]  Vitamin D, Ergocalciferol, (DRISDOL) 1.25 MG (50000 UNIT) CAPS capsule Take 1 capsule (50,000 Units total) by mouth every 7 (seven) days for 12 doses. 10/18/20 01/04/21 Yes Henderson Cloud, MD  clobetasol cream (TEMOVATE) 0.05 % Apply at bedtime as needed Patient not taking: Reported on 12/30/2020 07/04/20   Theresia Majors, MD  doxycycline (VIBRA-TABS) 100 MG tablet Take 100 mg by mouth 2 (two) times daily. 12/23/20   [provider]  EPIPEN 2-PAK 0.3 MG/0.3ML SOAJ injection Reported on 09/24/2015 07/24/14   [provider]  ibuprofen (ADVIL,MOTRIN) 200 MG tablet Take by mouth.    [provider]  UNABLE TO FIND  Allergy shots once a week    [provider]    Family History Family History  Problem Relation Age of Onset  . Diabetes Father   . Hypertension Father   . Colon polyps Father   . Hypertension Mother   . Cancer Mother        Pancreatic  . Colon polyps Mother   . Rectal cancer Neg Hx   . Stomach cancer Neg Hx   . Colon cancer Neg Hx     Social History Social History   Tobacco Use  . Smoking status: Never Smoker  . Smokeless tobacco: Never Used  Vaping Use  . Vaping Use: Never used  Substance Use Topics  . Alcohol use: No    Alcohol/week: 0.0 standard drinks  . Drug use: No     Allergies   Metaxalone, Penicillins, and Tramadol-acetaminophen   Review of Systems Review of Systems  Constitutional: Negative.   HENT: Negative.   Eyes: Negative.   Respiratory: Negative.   Cardiovascular: Negative.   Gastrointestinal: Negative.   Genitourinary: Negative.   Musculoskeletal: Negative.   Skin: Positive for rash.  Neurological: Negative.      Physical Exam Triage Vital Signs ED Triage Vitals  Enc Vitals Group     BP 12/30/20 1658 138/86     Pulse Rate 12/30/20 1658 (!) 101     Resp 12/30/20 1658 16     Temp 12/30/20 1658 98.5 F (36.9 C)     Temp Source 12/30/20 1658 Oral     SpO2 12/30/20 1658 100 %     Weight --      Height --      Head Circumference --      Peak Flow --      Pain Score 12/30/20 1704 9     Pain Loc --      Pain Edu? --      Excl. in GC? --    No data found.  Updated Vital Signs BP 138/86 (BP Location: Right Arm)   Pulse (!) 101   Temp 98.5 F (36.9 C) (Oral)   Resp 16   LMP 04/05/2020   SpO2 100%   Physical Exam Vitals and nursing note reviewed.  Constitutional:      General: She is not in acute distress.    Appearance: She is obese. She is not ill-appearing.  HENT:     Head: Normocephalic and atraumatic.     Right Ear: Tympanic membrane and ear canal normal.     Left Ear: Tympanic membrane and ear canal normal.      Nose: Nose normal.     Mouth/Throat:     Mouth: Mucous membranes are moist.     Pharynx: Oropharynx is clear.  Eyes:  Extraocular Movements: Extraocular movements intact.     Conjunctiva/sclera: Conjunctivae normal.     Pupils: Pupils are equal, round, and reactive to light.  Cardiovascular:     Rate and Rhythm: Normal rate and regular rhythm.     Pulses: Normal pulses.     Heart sounds: Normal heart sounds.  Pulmonary:     Effort: Pulmonary effort is normal.     Breath sounds: Normal breath sounds.  Musculoskeletal:     Cervical back: Normal range of motion and neck supple. No tenderness.  Lymphadenopathy:     Cervical: No cervical adenopathy.  Skin:    General: Skin is warm and dry.     Comments: Right foot (medial aspect): Erythematous, with blistered noted-please see image  Neurological:     General: No focal deficit present.     Mental Status: She is alert and oriented to person, place, and time.  Psychiatric:        Mood and Affect: Mood normal.        Behavior: Behavior normal.      UC Treatments / Results  Labs (all labs ordered are listed, but only abnormal results are displayed) Labs Reviewed - No data to display  EKG   Radiology No results found.  Procedures Procedures (including critical care time)  Medications Ordered in UC Medications  methylPREDNISolone acetate (DEPO-MEDROL) injection 80 mg (80 mg Intramuscular Given 12/30/20 1734)    Initial Impression / Assessment and Plan / UC Course  I have reviewed the triage vital signs and the nursing notes.  Pertinent labs & imaging results that were available during my care of the patient were reviewed by me and considered in my medical decision making (see chart for details).     MDM: 1 Adverse effect of drug/drug rash.  Patient discharged home hemodynamically stable. Final Clinical Impressions(s) / UC Diagnoses   Final diagnoses:  Adverse effect of drug, initial encounter  Rash and  nonspecific skin eruption     Discharge Instructions     Advised/instructed patient to start Medrol Dosepak tomorrow morning, 12/30/2020.  Advised patient if blisters worsen and or unresolved please follow-up here or with PCP.    ED Prescriptions    Medication Sig Dispense Auth. Provider   methylPREDNISolone (MEDROL DOSEPAK) 4 MG TBPK tablet Use as directed 1 each Trevor Iha, FNP     PDMP not reviewed this encounter.   Trevor Iha, FNP 12/30/20 1752

## 2020-12-30 NOTE — Discharge Instructions (Addendum)
Advised/instructed patient to start Medrol Dosepak tomorrow morning, 12/30/2020.  Advised patient if blisters worsen and or unresolved please follow-up here or with PCP.

## 2020-12-30 NOTE — ED Triage Notes (Signed)
Started on doxy last Wed for ear infection - woke w/ red spot on R hand and feet w/ blisters on Thursday  Epsom salt soaks

## 2021-01-01 ENCOUNTER — Other Ambulatory Visit: Payer: Self-pay

## 2021-01-02 ENCOUNTER — Ambulatory Visit: Payer: Federal, State, Local not specified - PPO | Admitting: Family Medicine

## 2021-01-02 ENCOUNTER — Encounter: Payer: Self-pay | Admitting: Family Medicine

## 2021-01-02 VITALS — BP 142/100 | HR 97 | Temp 98.2°F | Wt 237.2 lb

## 2021-01-02 DIAGNOSIS — L270 Generalized skin eruption due to drugs and medicaments taken internally: Secondary | ICD-10-CM

## 2021-01-02 DIAGNOSIS — I10 Essential (primary) hypertension: Secondary | ICD-10-CM | POA: Diagnosis not present

## 2021-01-02 NOTE — Progress Notes (Signed)
Subjective:    Patient ID: Catherine Campbell, female    DOB: 04/05/1966, 55 y.o.   MRN: [id number]  Chief Complaint  Patient presents with  . Rash    Rash/blisters on inside of rt foot. Had ear infection, was given doxycycline Wed., woke up with rash on Thursday last week. Could not get appt in office went to [facility], was advised doxycycline     HPI Patient was seen today for acute concern.  Patient developed a rash after starting doxycycline for AOM.  Patient seen by UC, started on steroids po.  Patient notes improvement in rash with the exception of a large area of ecchymosis and blister on right medial foot.  Patient notes history of similar rash with penicillins.  Rash sore and becoming pruritic right foot.  Past Medical History:  Diagnosis Date  . Allergy   . ASCUS with positive high risk human papillomavirus of vagina 06/2013, 12/2016   Colposcopic biopsy koilocytotic atypia.  . Asthma   . Hypertension   . Low grade squamous intraepithelial lesion (LGSIL) 06/2014, 06/2015, 06/2016, 06/2017, 12/2017, 07/2018   positive high-risk HPV. Colposcopic biopsy LGSIL negative E[facility] 2015,  ECC 2017 with koilocytotic atypia changes    Allergies  Allergen Reactions  . Metaxalone Hives  . Penicillins Hives  . Tramadol-Acetaminophen Hives    ROS General: Denies fever, chills, night sweats, changes in weight, changes in appetite HEENT: Denies headaches, ear pain, changes in vision, rhinorrhea, sore throat CV: Denies CP, palpitations, SOB, orthopnea Pulm: Denies SOB, cough, wheezing GI: Denies abdominal pain, nausea, vomiting, diarrhea, constipation GU: Denies dysuria, hematuria, frequency, vaginal discharge Msk: Denies muscle cramps, joint pains Neuro: Denies weakness, numbness, tingling Skin: +rash, bruise Psych: Denies depression, anxiety, hallucinations    Objective:    Blood pressure (!) 142/100, pulse 97, temperature 98.2 F (36.8 C), temperature source Oral, weight 237 lb 3.2 oz  (107.6 kg), SpO2 99 %.  Gen. Pleasant, well-nourished, in no distress, normal affect  HEENT: Atkins/AT, face symmetric, conjunctiva clear, no scleral icterus, PERRLA, EOMI, nares patent without drainage, pharynx without erythema or exudate. Lungs: no accessory muscle use, CTAB, no wheezes or rales Cardiovascular: RRR, no m/r/g, no peripheral edema Musculoskeletal: No deformities, no cyanosis or clubbing, normal tone Neuro:  A&Ox3, CN II after XII intact, normal gait Skin:  Warm, dry, intact.  pustules with surrounding mild erythema on medial R foot proximal to heel.  Areas of ecchymosis on dorsum of R foot and toes.  Similar area on extensor surface of R thumb.         Wt Readings from Last 3 Encounters:  01/02/21 237 lb 3.2 oz (107.6 kg)  10/18/20 239 lb 4.8 oz (108.5 kg)  08/05/20 239 lb (108.4 kg)    Lab Results  Component Value Date   WBC 5.5 10/18/2020   HGB 11.9 (L) 10/18/2020   HCT 36.0 10/18/2020   PLT 312.0 10/18/2020   GLUCOSE 83 10/18/2020   CHOL 227 (H) 10/18/2020   TRIG 95.0 10/18/2020   HDL 60.60 10/18/2020   LDLDIRECT 179.1 11/29/2009   LDLCALC 147 (H) 10/18/2020   ALT 12 10/18/2020   AST 14 10/18/2020   NA 142 10/18/2020   K 4.3 10/18/2020   CL 104 10/18/2020   CREATININE 0.96 10/18/2020   BUN 13 10/18/2020   CO2 31 10/18/2020   TSH 1.93 10/18/2020   HGBA1C 5.5 10/18/2020    Assessment/Plan:  Drug rash -continue supportive care with topical creams prn for any pruritis.  Po antihistamine for systemic pruritis -continue steroid course -avoid scratching or picking skin as may lead to 2ndary infection -given strict precuations  Essential hypertension -uncontrolled -possibly 2/2 current steroid use.  Also med rxn -recheck -lifestyle modifications -continue current med: losartan 100 mg -monitor at home -for continued elevation obtain labs (UA, BMP, TSH, etc) and and add another med to regimen  F/u in 1 wk  Abbe Amsterdam, MD

## 2021-01-02 NOTE — Patient Instructions (Signed)
Drug Rash A drug rash occurs when a medicine causes a change in the color or texture of the skin. It can develop minutes, hours, or days after you take the medicine. The rash may appear on a small area of skin or all over your body. What are the causes? This condition is usually caused by your body's reaction (allergy) to a medicine. It can also be caused by exposure to sunlight after taking a medicine that makes your skin sensitive to light. Though any medicine can cause a rash or reaction, medicines that are more likely to cause rashes include:  Penicillin.  Antibiotic medicines.  Medicines that treat seizures.  Medicines that treat cancer (chemotherapy).  Aspirin and other NSAIDs.  Injectable dyes that contain iodine.  Insulin. What are the signs or symptoms? Symptoms of this condition include:  Redness.  Tiny bumps.  Peeling.  Itching.  Itchy welts (hives).  Swelling.   How is this diagnosed? This condition may be diagnosed based on:  A physical exam.  Tests to find out which medicine caused the rash. These tests may include: ? Skin tests. ? Blood tests. ? Challenge test. For this test, you stop taking all the medicines that you do not need to take. Then, you start taking them again by adding back one medicine at a time. How is this treated? This condition is treated with medicines, including:  Antihistamine. This may be given to relieve itching.  NSAIDs. These may be given to reduce swelling and to treat pain.  A steroid medicine. This may be given to reduce swelling. The rash usually goes away when you stop taking the medicine that caused it. Follow these instructions at home:  Take over-the-counter and prescription medicines only as told by your health care provider.  Tell all your health care providers about any medicine reactions that you have had in the past.  If your rash was caused by sensitivity to sunlight, and while your rash is  healing: ? Avoid being in the sun if possible, especially when it is strongest, usually between 10 a.m. and 4 p.m. ? Cover your skin with pants, long sleeves, and a hat when you are exposed to sunlight.  If you have hives: ? Take a cool shower or use a cool compress to relieve itchiness. ? Take over-the-counter antihistamines, as recommended by your health care provider, until the hives are gone. Hives are not contagious.  Keep all follow-up visits as told by your health care provider. This is important. Contact a health care provider if you have:  A fever.  A rash that is not going away.  A rash that gets worse.  A rash that comes back.  Wheezing or coughing. Get help right away if:  You start to have breathing problems.  You start to have shortness of breath.  Your face or throat starts to swell.  You have severe weakness with dizziness or fainting.  You have chest pain. These symptoms may represent a serious problem that is an emergency. Do not wait to see if the symptoms will go away. Get medical help right away. Call your local emergency services (911 in the [place] Do not drive yourself to the hospital. Summary  A drug rash occurs when a medicine causes a change in the color or texture of the skin. The rash may appear on a small area of skin or all over your body.  It can develop minutes, hours, or days after you take the medicine.  Your health   care provider will do various tests to determine what medicine caused your rash.  The rash may be treated with medicine to relieve itching, swelling, and pain. This information is not intended to replace advice given to you by your health care provider. Make sure you discuss any questions you have with your health care provider. Document Revised: 11/22/2019 Document Reviewed: 11/22/2019 Elsevier Patient Education  2021 [employer]  

## 2021-01-09 DIAGNOSIS — J3089 Other allergic rhinitis: Secondary | ICD-10-CM | POA: Diagnosis not present

## 2021-01-09 DIAGNOSIS — J301 Allergic rhinitis due to pollen: Secondary | ICD-10-CM | POA: Diagnosis not present

## 2021-01-10 ENCOUNTER — Other Ambulatory Visit: Payer: Self-pay | Admitting: Internal Medicine

## 2021-01-10 DIAGNOSIS — E559 Vitamin D deficiency, unspecified: Secondary | ICD-10-CM

## 2021-01-17 DIAGNOSIS — J301 Allergic rhinitis due to pollen: Secondary | ICD-10-CM | POA: Diagnosis not present

## 2021-01-17 DIAGNOSIS — J3089 Other allergic rhinitis: Secondary | ICD-10-CM | POA: Diagnosis not present

## 2021-01-22 DIAGNOSIS — J301 Allergic rhinitis due to pollen: Secondary | ICD-10-CM | POA: Diagnosis not present

## 2021-01-23 DIAGNOSIS — J3089 Other allergic rhinitis: Secondary | ICD-10-CM | POA: Diagnosis not present

## 2021-01-24 ENCOUNTER — Ambulatory Visit (INDEPENDENT_AMBULATORY_CARE_PROVIDER_SITE_OTHER): Payer: Federal, State, Local not specified - PPO | Admitting: *Deleted

## 2021-01-24 ENCOUNTER — Other Ambulatory Visit: Payer: Self-pay

## 2021-01-24 DIAGNOSIS — E538 Deficiency of other specified B group vitamins: Secondary | ICD-10-CM

## 2021-01-24 MED ORDER — CYANOCOBALAMIN 1000 MCG/ML IJ SOLN
1000.0000 ug | Freq: Once | INTRAMUSCULAR | Status: AC
  Administered 2021-01-24: 1000 ug via INTRAMUSCULAR

## 2021-01-24 NOTE — Progress Notes (Signed)
Per orders of Dr. Hernandez, injection of B12 given by Krishiv Sandler. Patient tolerated injection well.  

## 2021-01-28 ENCOUNTER — Other Ambulatory Visit: Payer: Self-pay | Admitting: Internal Medicine

## 2021-01-28 DIAGNOSIS — I10 Essential (primary) hypertension: Secondary | ICD-10-CM

## 2021-01-30 DIAGNOSIS — J3089 Other allergic rhinitis: Secondary | ICD-10-CM | POA: Diagnosis not present

## 2021-01-30 DIAGNOSIS — J301 Allergic rhinitis due to pollen: Secondary | ICD-10-CM | POA: Diagnosis not present

## 2021-02-05 DIAGNOSIS — J301 Allergic rhinitis due to pollen: Secondary | ICD-10-CM | POA: Diagnosis not present

## 2021-02-05 DIAGNOSIS — J3089 Other allergic rhinitis: Secondary | ICD-10-CM | POA: Diagnosis not present

## 2021-02-13 DIAGNOSIS — J3089 Other allergic rhinitis: Secondary | ICD-10-CM | POA: Diagnosis not present

## 2021-02-13 DIAGNOSIS — J301 Allergic rhinitis due to pollen: Secondary | ICD-10-CM | POA: Diagnosis not present

## 2021-02-19 DIAGNOSIS — J301 Allergic rhinitis due to pollen: Secondary | ICD-10-CM | POA: Diagnosis not present

## 2021-02-19 DIAGNOSIS — J3089 Other allergic rhinitis: Secondary | ICD-10-CM | POA: Diagnosis not present

## 2021-02-21 DIAGNOSIS — J3089 Other allergic rhinitis: Secondary | ICD-10-CM | POA: Diagnosis not present

## 2021-02-21 DIAGNOSIS — J301 Allergic rhinitis due to pollen: Secondary | ICD-10-CM | POA: Diagnosis not present

## 2021-02-25 ENCOUNTER — Other Ambulatory Visit: Payer: Self-pay

## 2021-02-25 ENCOUNTER — Ambulatory Visit: Payer: Federal, State, Local not specified - PPO

## 2021-02-25 DIAGNOSIS — J301 Allergic rhinitis due to pollen: Secondary | ICD-10-CM | POA: Diagnosis not present

## 2021-02-25 DIAGNOSIS — J3089 Other allergic rhinitis: Secondary | ICD-10-CM | POA: Diagnosis not present

## 2021-02-27 ENCOUNTER — Telehealth: Payer: Self-pay

## 2021-02-27 NOTE — Telephone Encounter (Signed)
Patient came in for B12 injection on 7/5,  looked at past labs and saw vit B12 was 1506 back on 2/25. Informed the patient she could get injection if she wanted, and labs would need to be checked. She opted not to get the injection and stated she will schedule fasting lab appointment.

## 2021-02-28 DIAGNOSIS — J3089 Other allergic rhinitis: Secondary | ICD-10-CM | POA: Diagnosis not present

## 2021-02-28 DIAGNOSIS — J301 Allergic rhinitis due to pollen: Secondary | ICD-10-CM | POA: Diagnosis not present

## 2021-03-04 DIAGNOSIS — J3089 Other allergic rhinitis: Secondary | ICD-10-CM | POA: Diagnosis not present

## 2021-03-04 DIAGNOSIS — J301 Allergic rhinitis due to pollen: Secondary | ICD-10-CM | POA: Diagnosis not present

## 2021-03-12 DIAGNOSIS — J301 Allergic rhinitis due to pollen: Secondary | ICD-10-CM | POA: Diagnosis not present

## 2021-03-12 DIAGNOSIS — J3089 Other allergic rhinitis: Secondary | ICD-10-CM | POA: Diagnosis not present

## 2021-03-18 ENCOUNTER — Other Ambulatory Visit: Payer: Self-pay | Admitting: Internal Medicine

## 2021-03-18 ENCOUNTER — Other Ambulatory Visit (INDEPENDENT_AMBULATORY_CARE_PROVIDER_SITE_OTHER): Payer: Federal, State, Local not specified - PPO

## 2021-03-18 ENCOUNTER — Other Ambulatory Visit: Payer: Self-pay

## 2021-03-18 DIAGNOSIS — E785 Hyperlipidemia, unspecified: Secondary | ICD-10-CM

## 2021-03-18 DIAGNOSIS — E559 Vitamin D deficiency, unspecified: Secondary | ICD-10-CM

## 2021-03-18 DIAGNOSIS — H16223 Keratoconjunctivitis sicca, not specified as Sjogren's, bilateral: Secondary | ICD-10-CM | POA: Diagnosis not present

## 2021-03-18 DIAGNOSIS — H40023 Open angle with borderline findings, high risk, bilateral: Secondary | ICD-10-CM | POA: Diagnosis not present

## 2021-03-18 DIAGNOSIS — H10413 Chronic giant papillary conjunctivitis, bilateral: Secondary | ICD-10-CM | POA: Diagnosis not present

## 2021-03-18 LAB — LIPID PANEL
Cholesterol: 155 mg/dL (ref 0–200)
HDL: 57.1 mg/dL (ref >39.00)
LDL Cholesterol: 83 mg/dL (ref 0–99)
NonHDL: 97.42
Total CHOL/HDL Ratio: 3
Triglycerides: 74 mg/dL (ref 0.0–149.0)
VLDL: 14.8 mg/dL (ref 0.0–40.0)

## 2021-03-18 LAB — VITAMIN D 25 HYDROXY (VIT D DEFICIENCY, FRACTURES): VITD: 28.81 ng/mL — ABNORMAL LOW (ref 30.00–100.00)

## 2021-03-18 MED ORDER — VITAMIN D (ERGOCALCIFEROL) 1.25 MG (50000 UNIT) PO CAPS: 50000.0000 [IU] | ORAL_CAPSULE | ORAL | 0 refills | Status: AC

## 2021-03-20 DIAGNOSIS — J3089 Other allergic rhinitis: Secondary | ICD-10-CM | POA: Diagnosis not present

## 2021-03-20 DIAGNOSIS — J301 Allergic rhinitis due to pollen: Secondary | ICD-10-CM | POA: Diagnosis not present

## 2021-03-27 DIAGNOSIS — J3089 Other allergic rhinitis: Secondary | ICD-10-CM | POA: Diagnosis not present

## 2021-03-27 DIAGNOSIS — J301 Allergic rhinitis due to pollen: Secondary | ICD-10-CM | POA: Diagnosis not present

## 2021-04-03 DIAGNOSIS — J3089 Other allergic rhinitis: Secondary | ICD-10-CM | POA: Diagnosis not present

## 2021-04-03 DIAGNOSIS — J301 Allergic rhinitis due to pollen: Secondary | ICD-10-CM | POA: Diagnosis not present

## 2021-04-10 DIAGNOSIS — J3089 Other allergic rhinitis: Secondary | ICD-10-CM | POA: Diagnosis not present

## 2021-04-10 DIAGNOSIS — J301 Allergic rhinitis due to pollen: Secondary | ICD-10-CM | POA: Diagnosis not present

## 2021-04-15 ENCOUNTER — Other Ambulatory Visit: Payer: Self-pay | Admitting: Internal Medicine

## 2021-04-15 DIAGNOSIS — E785 Hyperlipidemia, unspecified: Secondary | ICD-10-CM

## 2021-04-17 DIAGNOSIS — J3089 Other allergic rhinitis: Secondary | ICD-10-CM | POA: Diagnosis not present

## 2021-04-17 DIAGNOSIS — J301 Allergic rhinitis due to pollen: Secondary | ICD-10-CM | POA: Diagnosis not present

## 2021-04-24 DIAGNOSIS — J301 Allergic rhinitis due to pollen: Secondary | ICD-10-CM | POA: Diagnosis not present

## 2021-04-24 DIAGNOSIS — J3089 Other allergic rhinitis: Secondary | ICD-10-CM | POA: Diagnosis not present

## 2021-04-29 DIAGNOSIS — H1045 Other chronic allergic conjunctivitis: Secondary | ICD-10-CM | POA: Diagnosis not present

## 2021-04-29 DIAGNOSIS — J3089 Other allergic rhinitis: Secondary | ICD-10-CM | POA: Diagnosis not present

## 2021-04-29 DIAGNOSIS — J4540 Moderate persistent asthma, uncomplicated: Secondary | ICD-10-CM | POA: Diagnosis not present

## 2021-04-29 DIAGNOSIS — J301 Allergic rhinitis due to pollen: Secondary | ICD-10-CM | POA: Diagnosis not present

## 2021-05-15 DIAGNOSIS — J3089 Other allergic rhinitis: Secondary | ICD-10-CM | POA: Diagnosis not present

## 2021-05-15 DIAGNOSIS — J301 Allergic rhinitis due to pollen: Secondary | ICD-10-CM | POA: Diagnosis not present

## 2021-05-22 DIAGNOSIS — J301 Allergic rhinitis due to pollen: Secondary | ICD-10-CM | POA: Diagnosis not present

## 2021-05-22 DIAGNOSIS — J3089 Other allergic rhinitis: Secondary | ICD-10-CM | POA: Diagnosis not present

## 2021-05-28 DIAGNOSIS — J3089 Other allergic rhinitis: Secondary | ICD-10-CM | POA: Diagnosis not present

## 2021-05-28 DIAGNOSIS — J301 Allergic rhinitis due to pollen: Secondary | ICD-10-CM | POA: Diagnosis not present

## 2021-06-02 ENCOUNTER — Other Ambulatory Visit: Payer: Self-pay | Admitting: Internal Medicine

## 2021-06-02 DIAGNOSIS — E559 Vitamin D deficiency, unspecified: Secondary | ICD-10-CM

## 2021-06-02 DIAGNOSIS — J3089 Other allergic rhinitis: Secondary | ICD-10-CM | POA: Diagnosis not present

## 2021-06-02 DIAGNOSIS — J301 Allergic rhinitis due to pollen: Secondary | ICD-10-CM | POA: Diagnosis not present

## 2021-06-09 DIAGNOSIS — J3089 Other allergic rhinitis: Secondary | ICD-10-CM | POA: Diagnosis not present

## 2021-06-09 DIAGNOSIS — J301 Allergic rhinitis due to pollen: Secondary | ICD-10-CM | POA: Diagnosis not present

## 2021-06-18 DIAGNOSIS — J301 Allergic rhinitis due to pollen: Secondary | ICD-10-CM | POA: Diagnosis not present

## 2021-06-18 DIAGNOSIS — J3089 Other allergic rhinitis: Secondary | ICD-10-CM | POA: Diagnosis not present

## 2021-06-26 DIAGNOSIS — J301 Allergic rhinitis due to pollen: Secondary | ICD-10-CM | POA: Diagnosis not present

## 2021-06-26 DIAGNOSIS — J3089 Other allergic rhinitis: Secondary | ICD-10-CM | POA: Diagnosis not present

## 2021-07-01 DIAGNOSIS — J301 Allergic rhinitis due to pollen: Secondary | ICD-10-CM | POA: Diagnosis not present

## 2021-07-01 DIAGNOSIS — J3089 Other allergic rhinitis: Secondary | ICD-10-CM | POA: Diagnosis not present

## 2021-07-02 DIAGNOSIS — J3089 Other allergic rhinitis: Secondary | ICD-10-CM | POA: Diagnosis not present

## 2021-07-02 DIAGNOSIS — J301 Allergic rhinitis due to pollen: Secondary | ICD-10-CM | POA: Diagnosis not present

## 2021-07-09 DIAGNOSIS — J301 Allergic rhinitis due to pollen: Secondary | ICD-10-CM | POA: Diagnosis not present

## 2021-07-09 DIAGNOSIS — J3089 Other allergic rhinitis: Secondary | ICD-10-CM | POA: Diagnosis not present

## 2021-07-16 DIAGNOSIS — J3089 Other allergic rhinitis: Secondary | ICD-10-CM | POA: Diagnosis not present

## 2021-07-16 DIAGNOSIS — J301 Allergic rhinitis due to pollen: Secondary | ICD-10-CM | POA: Diagnosis not present

## 2021-07-21 ENCOUNTER — Other Ambulatory Visit: Payer: Self-pay | Admitting: Internal Medicine

## 2021-07-21 DIAGNOSIS — E785 Hyperlipidemia, unspecified: Secondary | ICD-10-CM

## 2021-07-24 DIAGNOSIS — J3089 Other allergic rhinitis: Secondary | ICD-10-CM | POA: Diagnosis not present

## 2021-07-24 DIAGNOSIS — J301 Allergic rhinitis due to pollen: Secondary | ICD-10-CM | POA: Diagnosis not present

## 2021-07-26 ENCOUNTER — Other Ambulatory Visit: Payer: Self-pay | Admitting: Internal Medicine

## 2021-07-26 DIAGNOSIS — I10 Essential (primary) hypertension: Secondary | ICD-10-CM

## 2021-08-06 ENCOUNTER — Ambulatory Visit (INDEPENDENT_AMBULATORY_CARE_PROVIDER_SITE_OTHER): Payer: Federal, State, Local not specified - PPO | Admitting: Nurse Practitioner

## 2021-08-06 ENCOUNTER — Other Ambulatory Visit (HOSPITAL_COMMUNITY)
Admission: RE | Admit: 2021-08-06 | Discharge: 2021-08-06 | Disposition: A | Discharge status: Home / Self Care | Payer: Federal, State, Local not specified - PPO | Source: Ambulatory Visit | Attending: Nurse Practitioner | Admitting: Nurse Practitioner

## 2021-08-06 ENCOUNTER — Encounter: Payer: Self-pay | Admitting: Nurse Practitioner

## 2021-08-06 ENCOUNTER — Other Ambulatory Visit: Payer: Self-pay

## 2021-08-06 VITALS — BP 124/84 | Ht 65.0 in | Wt 243.0 lb

## 2021-08-06 DIAGNOSIS — Z01419 Encounter for gynecological examination (general) (routine) without abnormal findings: Secondary | ICD-10-CM

## 2021-08-06 DIAGNOSIS — J301 Allergic rhinitis due to pollen: Secondary | ICD-10-CM | POA: Diagnosis not present

## 2021-08-06 DIAGNOSIS — J3089 Other allergic rhinitis: Secondary | ICD-10-CM | POA: Diagnosis not present

## 2021-08-06 DIAGNOSIS — N951 Menopausal and female climacteric states: Secondary | ICD-10-CM | POA: Diagnosis not present

## 2021-08-06 NOTE — Progress Notes (Signed)
Catherine Campbell 12/20/1965 [id number]   History:  55 y.o. G0 presents for annual exam. Perimenopausal. Prior to menses last week it had been about 6 months. Having night sweats and hot flashes but they are tolerable. History of persistent LGSIL 2015-2019, negative colposcopy and biopsy 2020, normal pap 07/2020. HTN, HLD managed by PCP.   Gynecologic History No LMP recorded. (Menstrual status: Perimenopausal).   Contraception/Family planning: abstinence Sexually active: No  Health Maintenance Last Pap: 08/05/2020. Results were: Normal Last mammogram: 11/05/2020. Results were: Normal Last colonoscopy: 05/26/2018. Results were: Normal, 10-year recall Last Dexa: Not indicated  Past medical history, past surgical history, family history and social history were all reviewed and documented in the EPIC chart. Works for [employer].   ROS:  A ROS was performed and pertinent positives and negatives are included.  Exam:  Vitals:   08/06/21 1333  BP: 124/84  Weight: 243 lb (110.2 kg)  Height: 5\' 5"  (1.651 m)   Body mass index is 40.44 kg/m.  General appearance:  Normal Thyroid:  Symmetrical, normal in size, without palpable masses or nodularity. Respiratory  Auscultation:  Clear without wheezing or rhonchi Cardiovascular  Auscultation:  Regular rate, without rubs, murmurs or gallops  Edema/varicosities:  Not grossly evident Abdominal  Soft,nontender, without masses, guarding or rebound.  Liver/spleen:  No organomegaly noted  Hernia:  None appreciated  Skin  Inspection:  Grossly normal Breasts: Examined lying and sitting.   Right: Without masses, retractions, nipple discharge or axillary adenopathy.   Left: Without masses, retractions, nipple discharge or axillary adenopathy. Genitourinary   Inguinal/mons:  Normal without inguinal adenopathy  External genitalia:  Normal appearing vulva with no masses, tenderness, or lesions  BUS/Urethra/Skene's glands:  Normal  Vagina:  Normal  appearing with normal color and discharge, no lesions  Cervix:  Normal appearing without discharge or lesions  Uterus:  Difficult to palpate due to body habitus  but no gross masses or tenderness  Adnexa/parametria:     Rt: Normal in size, without masses or tenderness.   Lt: Normal in size, without masses or tenderness.  Anus and perineum: Normal  Digital rectal exam: Normal sphincter tone without palpated masses or tenderness  Patient informed chaperone available to be present for breast and pelvic exam. Patient has requested no chaperone to be present. Patient has been advised what will be completed during breast and pelvic exam.   Assessment/Plan:  55 y.o. G0 for annual exam.   Well female exam with routine gynecological exam - Plan: Cytology - PAP( [facility]). Education provided on SBEs, importance of preventative screenings, current guidelines, high calcium diet, regular exercise, and multivitamin daily.  Labs with PCP.  Perimenopause - Prior to menses last week it had been about 6 months. Having night sweats and hot flashes but they are tolerable.   Screening for cervical cancer - History of persistent LGSIL 2015-2019, negative colposcopy and biopsy 2020, normal pap 07/2020. Pap with HR HPV today.   Screening for breast cancer - Normal mammogram history.  Continue annual screenings.  Normal breast exam today.  Screening for colon cancer - 2019 colonoscopy. Will repeat at GI's recommended interval.   Return in 1 year for annual.   Olivia Mackie DNP, 1:45 PM 08/06/2021

## 2021-08-12 LAB — CYTOLOGY - PAP
Comment: NEGATIVE
Diagnosis: UNDETERMINED — AB
High risk HPV: NEGATIVE

## 2021-08-13 DIAGNOSIS — J3089 Other allergic rhinitis: Secondary | ICD-10-CM | POA: Diagnosis not present

## 2021-08-13 DIAGNOSIS — J301 Allergic rhinitis due to pollen: Secondary | ICD-10-CM | POA: Diagnosis not present

## 2021-09-04 DIAGNOSIS — J301 Allergic rhinitis due to pollen: Secondary | ICD-10-CM | POA: Diagnosis not present

## 2021-09-04 DIAGNOSIS — J3089 Other allergic rhinitis: Secondary | ICD-10-CM | POA: Diagnosis not present

## 2021-09-08 DIAGNOSIS — J301 Allergic rhinitis due to pollen: Secondary | ICD-10-CM | POA: Diagnosis not present

## 2021-09-08 DIAGNOSIS — J3089 Other allergic rhinitis: Secondary | ICD-10-CM | POA: Diagnosis not present

## 2021-09-11 DIAGNOSIS — J301 Allergic rhinitis due to pollen: Secondary | ICD-10-CM | POA: Diagnosis not present

## 2021-09-11 DIAGNOSIS — J3089 Other allergic rhinitis: Secondary | ICD-10-CM | POA: Diagnosis not present

## 2021-09-17 DIAGNOSIS — H10413 Chronic giant papillary conjunctivitis, bilateral: Secondary | ICD-10-CM | POA: Diagnosis not present

## 2021-09-17 DIAGNOSIS — H16223 Keratoconjunctivitis sicca, not specified as Sjogren's, bilateral: Secondary | ICD-10-CM | POA: Diagnosis not present

## 2021-09-17 DIAGNOSIS — H40023 Open angle with borderline findings, high risk, bilateral: Secondary | ICD-10-CM | POA: Diagnosis not present

## 2021-09-18 ENCOUNTER — Ambulatory Visit
Admission: RE | Admit: 2021-09-18 | Discharge: 2021-09-18 | Disposition: A | Discharge status: Home / Self Care | Payer: Federal, State, Local not specified - PPO | Source: Ambulatory Visit | Attending: Emergency Medicine | Admitting: Emergency Medicine

## 2021-09-18 ENCOUNTER — Other Ambulatory Visit: Payer: Self-pay

## 2021-09-18 VITALS — BP 147/86 | HR 100 | Temp 98.4°F | Resp 20

## 2021-09-18 DIAGNOSIS — J302 Other seasonal allergic rhinitis: Secondary | ICD-10-CM | POA: Diagnosis not present

## 2021-09-18 DIAGNOSIS — J309 Allergic rhinitis, unspecified: Secondary | ICD-10-CM

## 2021-09-18 DIAGNOSIS — J4521 Mild intermittent asthma with (acute) exacerbation: Secondary | ICD-10-CM

## 2021-09-18 DIAGNOSIS — J3089 Other allergic rhinitis: Secondary | ICD-10-CM | POA: Diagnosis not present

## 2021-09-18 MED ORDER — METHYLPREDNISOLONE 4 MG PO TBPK: ORAL_TABLET | ORAL | 0 refills | Status: DC

## 2021-09-18 MED ORDER — GUAIFENESIN 400 MG PO TABS: ORAL_TABLET | ORAL | 0 refills | Status: DC

## 2021-09-18 MED ORDER — METHYLPREDNISOLONE SODIUM SUCC 125 MG IJ SOLR
125.0000 mg | Freq: Once | INTRAMUSCULAR | Status: AC
  Administered 2021-09-18: 125 mg via INTRAMUSCULAR

## 2021-09-18 MED ORDER — IBUPROFEN 400 MG PO TABS
400.0000 mg | ORAL_TABLET | Freq: Three times a day (TID) | ORAL | 0 refills | Status: DC | PRN
Start: 2021-09-18 — End: 2022-05-04

## 2021-09-18 NOTE — ED Provider Notes (Signed)
[facility]    CSN: [id number] Arrival date & time: 09/18/21  4098    HISTORY   Chief Complaint  Patient presents with   Nasal Congestion   Appointment   HPI Catherine Campbell is a 56 y.o. female. Patient complains of nasal congestion that has been waxing and waning for the past few weeks.  Patient has previously been prescribed cetirizine and azelastine.  Patient also has a prescription for an EpiPen with no anaphylactic allergy listed in her chart.  Patient states she is currently undergoing allergy desensitization therapy, has not been able to get her regular weekly shots due to exacerbation of her underlying allergies, patient states she is allergic to "everything outside".  Patient states he is taking all medications as prescribed, currently on Symbicort and albuterol as needed, Allegra, Zyrtec nightly, Flonase and Astelin.  Patient states she is also been taking over-the-counter Mucinex DM and using saline rinses.  Patient states that today is the worst her congestion has been.  Patient denies use of Afrin nasal spray.  Patient denies fever, aches, chills, nausea, vomiting, diarrhea, loss of smell.  Patient denies known sick contacts.  Patient's oxygen saturation is 98%, normal respirations.  Heart rate and blood pressure a little elevated with a normal temperature.  The history is provided by the patient.  Past Medical History:  Diagnosis Date   Allergy    ASCUS with positive high risk human papillomavirus of vagina 06/2013, 12/2016   Colposcopic biopsy koilocytotic atypia.   Asthma    Hypertension    Low grade squamous intraepithelial lesion (LGSIL) 06/2014, 06/2015, 06/2016, 06/2017, 12/2017, 07/2018   positive high-risk HPV. Colposcopic biopsy LGSIL negative ECC 2015,  ECC 2017 with koilocytotic atypia changes   Patient Active Problem List   Diagnosis Date Noted   Morbid obesity (HCC) 02/06/2020   Vitamin D deficiency 04/04/2019   Vitamin B12 deficiency 04/04/2019    Seasonal allergies 03/31/2018   Asthma, chronic 08/30/2013   RECTAL BLEEDING 07/30/2010   Hyperlipidemia 12/06/2009   Allergic rhinitis 06/03/2009   VAGINITIS 01/09/2008   Acute upper respiratory infection 09/12/2007   Essential hypertension 05/24/2007   Past Surgical History:  Procedure Laterality Date   BOIL UNDER ARM     OB History     Gravida  0   Para  0   Term  0   Preterm  0   AB  0   Living  0      SAB  0   IAB  0   Ectopic  0   Multiple  0   Live Births  0          Home Medications    Prior to Admission medications   Medication Sig Start Date End Date Taking? Authorizing Provider  albuterol (VENTOLIN HFA) 108 (90 Base) MCG/ACT inhaler  04/02/17   [provider]  atorvastatin (LIPITOR) 20 MG tablet TAKE 1 TABLET BY MOUTH EVERY DAY 07/23/21   [name], Limmie Patricia, MD  azelastine (ASTELIN) 0.1 % nasal spray INSTILL 1 PUFF IN EACH NOSTRIL TWICE A DAY NASALLY 30 DAYS 03/29/16   [provider]  cetirizine (ZYRTEC) 10 MG tablet Take 10 mg by mouth daily.    [provider]  clobetasol cream (TEMOVATE) 0.05 % Apply at bedtime as needed 07/04/20   Theresia Majors, MD  EPIPEN 2-PAK 0.3 MG/0.3ML SOAJ injection Reported on 09/24/2015 07/24/14   [provider]  fexofenadine (ALLEGRA) 180 MG tablet Take 180 mg by  mouth daily.    [provider]  fluticasone (FLONASE) 50 MCG/ACT nasal spray INSTILL 2 SPRAYS IN EACH NOSTRIL ONCE A DAY NASALLY 30 DAYS 03/29/16   [provider]  ibuprofen (ADVIL,MOTRIN) 200 MG tablet Take by mouth.    [provider]  losartan (COZAAR) 50 MG tablet TAKE 2 TABLETS BY MOUTH EVERY DAY 07/28/21   [name], Limmie Patricia, MD  montelukast (SINGULAIR) 10 MG tablet Take 10 mg by mouth at bedtime.  04/30/13   [provider]  RESTASIS 0.05 % ophthalmic emulsion INSTILL 1 DROP INTO BOTH EYES TWICE A DAY 02/25/18   [provider]  [facility] 80-4.5 MCG/ACT  inhaler  02/10/18   [provider]  TYRVAYA 0.03 MG/ACT SOLN Used for dry eye 09/17/20   [provider]  UNABLE TO FIND Allergy shots once a week    [provider]   Family History Family History  Problem Relation Age of Onset   Diabetes Father    Hypertension Father    Colon polyps Father    Hypertension Mother    Cancer Mother        Pancreatic   Colon polyps Mother    Rectal cancer Neg Hx    Stomach cancer Neg Hx    Colon cancer Neg Hx    Social History Social History   Tobacco Use   Smoking status: Never   Smokeless tobacco: Never  Vaping Use   Vaping Use: Never used  Substance Use Topics   Alcohol use: No    Alcohol/week: 0.0 standard drinks   Drug use: No   Allergies   Doxycycline, Metaxalone, Penicillins, and Tramadol-acetaminophen  Review of Systems Review of Systems Pertinent findings noted in history of present illness.   Physical Exam Triage Vital Signs ED Triage Vitals  Enc Vitals Group     BP 06/20/21 0827 (!) 147/82     Pulse Rate 06/20/21 0827 72     Resp 06/20/21 0827 18     Temp 06/20/21 0827 98.3 F (36.8 C)     Temp Source 06/20/21 0827 Oral     SpO2 06/20/21 0827 98 %     Weight --      Height --      Head Circumference --      Peak Flow --      Pain Score 06/20/21 0826 5     Pain Loc --      Pain Edu? --      Excl. in GC? --   No data found.  Updated Vital Signs BP (!) 147/86 (BP Location: Right Arm)    Pulse 100    Temp 98.4 F (36.9 C) (Oral)    Resp 20    LMP 04/05/2020    SpO2 98%   Physical Exam Vitals and nursing note reviewed.  Constitutional:      General: She is not in acute distress.    Appearance: Normal appearance. She is not ill-appearing.  HENT:     Head: Normocephalic and atraumatic.     Salivary Glands: Right salivary gland is not diffusely enlarged or tender. Left salivary gland is not diffusely enlarged or tender.     Right Ear: Hearing, ear canal and external ear normal. No  drainage. A middle ear effusion is present. There is no impacted cerumen. Tympanic membrane is bulging. Tympanic membrane is not injected or erythematous.     Left Ear: Hearing, ear canal and external ear normal. No drainage. A middle ear  effusion is present. There is no impacted cerumen. Tympanic membrane is bulging. Tympanic membrane is not injected or erythematous.     Ears:     Comments: Bilateral EACs normal, both TMs bulging with clear fluid    Nose: Rhinorrhea present. No nasal deformity, septal deviation, signs of injury, nasal tenderness, mucosal edema or congestion. Rhinorrhea is clear.     Right Nostril: Occlusion present. No foreign body, epistaxis or septal hematoma.     Left Nostril: Occlusion present. No foreign body, epistaxis or septal hematoma.     Right Turbinates: Enlarged, swollen and pale.     Left Turbinates: Enlarged, swollen and pale.     Right Sinus: No maxillary sinus tenderness or frontal sinus tenderness.     Left Sinus: No maxillary sinus tenderness or frontal sinus tenderness.     Mouth/Throat:     Lips: Pink. No lesions.     Mouth: Mucous membranes are moist. No oral lesions.     Pharynx: Oropharynx is clear. Uvula midline. No posterior oropharyngeal erythema or uvula swelling.     Tonsils: No tonsillar exudate. 0 on the right. 0 on the left.     Comments: Postnasal drip Eyes:     General: Lids are normal.        Right eye: No discharge.        Left eye: No discharge.     Extraocular Movements: Extraocular movements intact.     Conjunctiva/sclera: Conjunctivae normal.     Right eye: Right conjunctiva is not injected.     Left eye: Left conjunctiva is not injected.  Neck:     Trachea: Trachea and phonation normal. No tracheal tenderness.  Cardiovascular:     Rate and Rhythm: Regular rhythm. Tachycardia present.     Pulses: Normal pulses.     Heart sounds: Normal heart sounds. No murmur heard.   No friction rub. No gallop.  Pulmonary:     Effort:  Pulmonary effort is normal. No accessory muscle usage, prolonged expiration or respiratory distress.     Breath sounds: No stridor, decreased air movement or transmitted upper airway sounds. Examination of the right-upper field reveals decreased breath sounds. Examination of the left-upper field reveals decreased breath sounds. Examination of the right-middle field reveals decreased breath sounds. Examination of the left-middle field reveals decreased breath sounds. Examination of the right-lower field reveals decreased breath sounds. Examination of the left-lower field reveals decreased breath sounds. Decreased breath sounds present. No wheezing, rhonchi or rales.  Chest:     Chest wall: No tenderness.  Abdominal:     General: Abdomen is flat. Bowel sounds are normal.     Palpations: Abdomen is soft.     Tenderness: There is no abdominal tenderness.     Hernia: No hernia is present.  Musculoskeletal:        General: Normal range of motion.     Cervical back: Normal range of motion and neck supple. No edema, erythema, rigidity or crepitus. No pain with movement. Normal range of motion.     Right lower leg: No edema.     Left lower leg: No edema.  Lymphadenopathy:     Cervical: No cervical adenopathy.  Skin:    General: Skin is warm and dry.     Findings: No erythema or rash.     Comments: Skin is warm and dry to touch, good turgor, no rash appreciated  Neurological:     General: No focal deficit present.     Mental Status:  She is alert and oriented to person, place, and time.     Motor: Motor function is intact.     Coordination: Coordination is intact.     Gait: Gait is intact.     Deep Tendon Reflexes:     Reflex Scores:      Patellar reflexes are 2+ on the right side and 2+ on the left side. Psychiatric:        Attention and Perception: Attention and perception normal.        Mood and Affect: Mood normal.        Speech: Speech normal.        Behavior: Behavior normal. Behavior is  cooperative.        Thought Content: Thought content normal.        Cognition and Memory: Cognition normal.        Judgment: Judgment normal.    Visual Acuity Right Eye Distance:   Left Eye Distance:   Bilateral Distance:    Right Eye Near:   Left Eye Near:    Bilateral Near:     UC Couse / Diagnostics / Procedures:    EKG  Radiology No results found.  Procedures Procedures (including critical care time)  UC Diagnoses / Final Clinical Impressions(s)   I have reviewed the triage vital signs and the nursing notes.  Pertinent labs & imaging results that were available during my care of the patient were reviewed by me and considered in my medical decision making (see chart for details).   Final diagnoses:  Perennial allergic rhinitis with seasonal variation  Allergic rhinitis, unspecified seasonality, unspecified trigger  Mild intermittent asthma with (acute) exacerbation   Patient provided with an injection of Solu-Medrol in the office today, following with a Medrol Dosepak.  Patient advised to continue Mucinex without the D (guaifenesin) along with all of her other currently prescribed allergy and asthma medications, encouraged patient to follow-up with her allergist ASAP.  Return precautions advised.  ED Prescriptions   None    PDMP not reviewed this encounter.  Pending results:  Labs Reviewed - No data to display  Medications Ordered in UC: Medications  methylPREDNISolone sodium succinate (SOLU-MEDROL) 125 mg/2 mL injection 125 mg (has no administration in time range)    Disposition Upon Discharge:  Condition: stable for discharge home Home: take medications as prescribed; routine discharge instructions as discussed; follow up as advised.  Patient presented with an acute illness with associated systemic symptoms and significant discomfort requiring urgent management. In my opinion, this is a condition that a prudent lay person (someone who possesses an average  knowledge of health and medicine) may potentially expect to result in complications if not addressed urgently such as respiratory distress, impairment of bodily function or dysfunction of bodily organs.   Routine symptom specific, illness specific and/or disease specific instructions were discussed with the patient and/or caregiver at length.   As such, the patient has been evaluated and assessed, work-up was performed and treatment was provided in alignment with urgent care protocols and evidence based medicine.  Patient/parent/caregiver has been advised that the patient may require follow up for further testing and treatment if the symptoms continue in spite of treatment, as clinically indicated and appropriate.  If the patient was tested for COVID-19, Influenza and/or RSV, then the patient/parent/guardian was advised to isolate at home pending the results of his/her diagnostic coronavirus test and potentially longer if theyre positive. I have also advised pt that if his/her COVID-19 test returns positive,  it's recommended to self-isolate for at least 10 days after symptoms first appeared AND until fever-free for 24 hours without fever reducer AND other symptoms have improved or resolved. Discussed self-isolation recommendations as well as instructions for household member/close contacts as per the [facility] and [place] DHHS, and also gave patient the COVID packet with this information.  Patient/parent/caregiver has been advised to return to the [facility] or PCP in 3-5 days if no better; to PCP or the Emergency Department if new signs and symptoms develop, or if the current signs or symptoms continue to change or worsen for further workup, evaluation and treatment as clinically indicated and appropriate  The patient will follow up with their current PCP if and as advised. If the patient does not currently have a PCP we will assist them in obtaining one.   The patient may need specialty follow up if the symptoms  continue, in spite of conservative treatment and management, for further workup, evaluation, consultation and treatment as clinically indicated and appropriate.  Patient/parent/caregiver verbalized understanding and agreement of plan as discussed.  All questions were addressed during visit.  Please see discharge instructions below for further details of plan.  Discharge Instructions: Discharge Instructions   None     This office note has been dictated using Dragon speech recognition software.  Unfortunately, and despite my best efforts, this method of dictation can sometimes lead to occasional typographical or grammatical errors.  I apologize in advance if this occurs.     Theadora Rama Scales, [place] 09/18/21 [phone]

## 2021-09-18 NOTE — ED Triage Notes (Signed)
Pt reports having congestion that has been on and off for two weeks.

## 2021-09-18 NOTE — Discharge Instructions (Addendum)
For the acute flareup of your allergies and asthma, you are provided with an injection of methylprednisolone 125 mg in the office today.  Within the next 30 to 45 minutes you should notice some significant improvement in your congestion and work of breathing.    Please begin taking the Medrol Dosepak as I have prescribed despite the preprinted instructions on the blister pack: please take 1 row tablets all  at once with your breakfast meal starting tomorrow morning, day 1 is 6 tablets.  Please continue your allergy and asthma medications as they have been prescribed for you including fexofenadine, cetirizine, albuterol, azelastine, fluticasone, Symbicort and montelukast.  Please also continue taking guaifenesin (Mucinex) without the "D".  Mucinex is considered an expectorant, it thin secretions and makes it easier for your sinuses to drain and for congestion in the lungs to be coughed up and out.  The "D" is a decongestant and guidelines recommend that you do not take decongestant medications for more than 3 days.  I have sent your prescription for guaifenesin to your pharmacy for your convenience.  If you tolerate ibuprofen, this is also an excellent medication to reduce inflammation in the lungs and in the nasal passages, I recommend you take 400 mg 3 times daily as needed for congestion.  I have also provided you with a prescription of medication.  In the next 24 to 48 hours, if you have not had significant improvement of your congestion and work of breathing or if you begin to feel that these are worsening, please report to the emergency room for more aggressive treatment.  I also strongly recommend that you follow-up with your allergist to let them know that you were here, the regimen I prescribed for you and to see if they have any further suggestions.  Thank you for visiting urgent care today.  I appreciate the opportunity to participate in your care.

## 2021-09-23 DIAGNOSIS — J301 Allergic rhinitis due to pollen: Secondary | ICD-10-CM | POA: Diagnosis not present

## 2021-09-23 DIAGNOSIS — J3089 Other allergic rhinitis: Secondary | ICD-10-CM | POA: Diagnosis not present

## 2021-09-25 DIAGNOSIS — J3089 Other allergic rhinitis: Secondary | ICD-10-CM | POA: Diagnosis not present

## 2021-09-25 DIAGNOSIS — J301 Allergic rhinitis due to pollen: Secondary | ICD-10-CM | POA: Diagnosis not present

## 2021-09-30 DIAGNOSIS — J3089 Other allergic rhinitis: Secondary | ICD-10-CM | POA: Diagnosis not present

## 2021-09-30 DIAGNOSIS — J301 Allergic rhinitis due to pollen: Secondary | ICD-10-CM | POA: Diagnosis not present

## 2021-10-07 DIAGNOSIS — J301 Allergic rhinitis due to pollen: Secondary | ICD-10-CM | POA: Diagnosis not present

## 2021-10-07 DIAGNOSIS — J3089 Other allergic rhinitis: Secondary | ICD-10-CM | POA: Diagnosis not present

## 2021-10-14 DIAGNOSIS — J301 Allergic rhinitis due to pollen: Secondary | ICD-10-CM | POA: Diagnosis not present

## 2021-10-14 DIAGNOSIS — J3089 Other allergic rhinitis: Secondary | ICD-10-CM | POA: Diagnosis not present

## 2021-10-15 DIAGNOSIS — J3089 Other allergic rhinitis: Secondary | ICD-10-CM | POA: Diagnosis not present

## 2021-10-21 ENCOUNTER — Ambulatory Visit: Payer: Federal, State, Local not specified - PPO | Admitting: Internal Medicine

## 2021-10-21 VITALS — BP 138/86 | HR 95 | Temp 97.8°F | Wt 246.6 lb

## 2021-10-21 DIAGNOSIS — M79601 Pain in right arm: Secondary | ICD-10-CM | POA: Diagnosis not present

## 2021-10-21 DIAGNOSIS — J302 Other seasonal allergic rhinitis: Secondary | ICD-10-CM | POA: Diagnosis not present

## 2021-10-21 DIAGNOSIS — J301 Allergic rhinitis due to pollen: Secondary | ICD-10-CM | POA: Diagnosis not present

## 2021-10-21 DIAGNOSIS — J3089 Other allergic rhinitis: Secondary | ICD-10-CM | POA: Diagnosis not present

## 2021-10-21 MED ORDER — MELOXICAM 7.5 MG PO TABS: 7.5000 mg | ORAL_TABLET | Freq: Every day | ORAL | 0 refills | Status: DC

## 2021-10-21 NOTE — Progress Notes (Signed)
Acute office Visit     This visit occurred during the SARS-CoV-2 public health emergency.  Safety protocols were in place, including screening questions prior to the visit, additional usage of staff PPE, and extensive cleaning of exam room while observing appropriate contact time as indicated for disinfecting solutions.    CC/Reason for Visit: Discuss some acute concerns  HPI: Catherine FORESTA is a 56 y.o. female who is coming in today for the above mentioned reasons.  Here to discuss 2 issues:  1.  She is having right biceps pain.  She recollects about 10 days ago she was at a local department store and was resting a lot of items on the crook of her elbow.  2.  She has a lot of seasonal allergies and asthma.  She has been dealing a lot with her allergies in the past week or 2.  She is seen at the allergy center.  She was recently treated with prednisone at an urgent care.  Past Medical/Surgical History: Past Medical History:  Diagnosis Date   Allergy    ASCUS with positive high risk human papillomavirus of vagina 06/2013, 12/2016   Colposcopic biopsy koilocytotic atypia.   Asthma    Hypertension    Low grade squamous intraepithelial lesion (LGSIL) 06/2014, 06/2015, 06/2016, 06/2017, 12/2017, 07/2018   positive high-risk HPV. Colposcopic biopsy LGSIL negative ECC 2015,  ECC 2017 with koilocytotic atypia changes    Past Surgical History:  Procedure Laterality Date   BOIL UNDER ARM      Social History:  reports that she has never smoked. She has never used smokeless tobacco. She reports that she does not drink alcohol and does not use drugs.  Allergies: Allergies  Allergen Reactions   Doxycycline Hives   Metaxalone Hives   Penicillins Hives   Tramadol-Acetaminophen Hives    Family History:  Family History  Problem Relation Age of Onset   Diabetes Father    Hypertension Father    Colon polyps Father    Hypertension Mother    Cancer Mother        Pancreatic    Colon polyps Mother    Rectal cancer Neg Hx    Stomach cancer Neg Hx    Colon cancer Neg Hx      Current Outpatient Medications:    albuterol (VENTOLIN HFA) 108 (90 Base) MCG/ACT inhaler, , Disp: , Rfl:    atorvastatin (LIPITOR) 20 MG tablet, TAKE 1 TABLET BY MOUTH EVERY DAY, Disp: 90 tablet, Rfl: 1   azelastine (ASTELIN) 0.1 % nasal spray, INSTILL 1 PUFF IN EACH NOSTRIL TWICE A DAY NASALLY 30 DAYS, Disp: , Rfl: 3   cetirizine (ZYRTEC) 10 MG tablet, Take 10 mg by mouth daily., Disp: , Rfl:    clobetasol cream (TEMOVATE) 0.05 %, Apply at bedtime as needed, Disp: 60 g, Rfl: 1   EPIPEN 2-PAK 0.3 MG/0.3ML SOAJ injection, Reported on 09/24/2015, Disp: , Rfl: 0   fexofenadine (ALLEGRA) 180 MG tablet, Take 180 mg by mouth daily., Disp: , Rfl:    fluticasone (FLONASE) 50 MCG/ACT nasal spray, INSTILL 2 SPRAYS IN EACH NOSTRIL ONCE A DAY NASALLY 30 DAYS, Disp: , Rfl: 3   guaifenesin (HUMIBID E) 400 MG TABS tablet, Take 1 tablet 3 times daily as needed for chest congestion and cough, Disp: 21 tablet, Rfl: 0   ibuprofen (ADVIL) 400 MG tablet, Take 1 tablet (400 mg total) by mouth every 8 (eight) hours as needed for up to 30 doses., Disp:  30 tablet, Rfl: 0   losartan (COZAAR) 50 MG tablet, TAKE 2 TABLETS BY MOUTH EVERY DAY, Disp: 180 tablet, Rfl: 0   meloxicam (MOBIC) 7.5 MG tablet, Take 1 tablet (7.5 mg total) by mouth daily., Disp: 30 tablet, Rfl: 0   methylPREDNISolone (MEDROL DOSEPAK) 4 MG TBPK tablet, Take 24 mg on day 1, 20 mg on day 2, 16 mg on day 3, 12 mg on day 4, 8 mg on day 5, 4 mg on day 6., Disp: 21 tablet, Rfl: 0   montelukast (SINGULAIR) 10 MG tablet, Take 10 mg by mouth at bedtime. , Disp: , Rfl:    RESTASIS 0.05 % ophthalmic emulsion, INSTILL 1 DROP INTO BOTH EYES TWICE A DAY, Disp: , Rfl: 3   SYMBICORT 80-4.5 MCG/ACT inhaler, , Disp: , Rfl:    TYRVAYA 0.03 MG/ACT SOLN, Used for dry eye, Disp: , Rfl:    UNABLE TO FIND, Allergy shots once a week, Disp: , Rfl:   Current  Facility-Administered Medications:    0.9 %  sodium chloride infusion, 500 mL, Intravenous, Once, Danis, Estill Cotta III, MD  Review of Systems:  Constitutional: Denies fever, chills, diaphoresis, appetite change and fatigue.  HEENT: Denies photophobia, eye pain, redness, mouth sores, trouble swallowing, neck pain, neck stiffness and tinnitus.   Respiratory: Denies SOB, DOE,  chest tightness,  and wheezing.   Cardiovascular: Denies chest pain, palpitations and leg swelling.  Gastrointestinal: Denies nausea, vomiting, abdominal pain, diarrhea, constipation, blood in stool and abdominal distention.  Genitourinary: Denies dysuria, urgency, frequency, hematuria, flank pain and difficulty urinating.  Endocrine: Denies: hot or cold intolerance, sweats, changes in hair or nails, polyuria, polydipsia. Musculoskeletal: Denies back pain, joint swelling, arthralgias and gait problem.  Skin: Denies pallor, rash and wound.  Neurological: Denies dizziness, seizures, syncope, weakness, light-headedness, numbness and headaches.  Hematological: Denies adenopathy. Easy bruising, personal or family bleeding history  Psychiatric/Behavioral: Denies suicidal ideation, mood changes, confusion, nervousness, sleep disturbance and agitation    Physical Exam: Vitals:   10/21/21 1432  BP: 138/86  Pulse: 95  Temp: 97.8 F (36.6 C)  TempSrc: Oral  SpO2: 99%  Weight: 246 lb 9.6 oz (111.9 kg)    Body mass index is 41.04 kg/m.   Constitutional: NAD, calm, comfortable, obese Eyes: PERRL, lids and conjunctivae normal, wears corrective lenses ENMT: Mucous membranes are moist.   Musculoskeletal: Pain at the insertion of the biceps tendon  Psychiatric: Normal judgment and insight. Alert and oriented x 3. Normal mood.    Impression and Plan:  Right arm pain  - Plan: meloxicam (MOBIC) 7.5 MG tablet -Likely muscular from overuse, advised meloxicam for 10 days and heating pad.  Seasonal allergies -Continue twice  daily antihistamines as she is, follow-up with allergist as scheduled.  Time spent: 22 minutes reviewing chart, interviewing and examining patient and formulating plan of care.    Lelon Frohlich, MD [facility]

## 2021-10-23 ENCOUNTER — Other Ambulatory Visit: Payer: Self-pay | Admitting: Internal Medicine

## 2021-10-23 DIAGNOSIS — I10 Essential (primary) hypertension: Secondary | ICD-10-CM

## 2021-10-30 DIAGNOSIS — J301 Allergic rhinitis due to pollen: Secondary | ICD-10-CM | POA: Diagnosis not present

## 2021-10-30 DIAGNOSIS — J3089 Other allergic rhinitis: Secondary | ICD-10-CM | POA: Diagnosis not present

## 2021-11-04 DIAGNOSIS — J301 Allergic rhinitis due to pollen: Secondary | ICD-10-CM | POA: Diagnosis not present

## 2021-11-04 DIAGNOSIS — J3089 Other allergic rhinitis: Secondary | ICD-10-CM | POA: Diagnosis not present

## 2021-11-04 DIAGNOSIS — J3081 Allergic rhinitis due to animal (cat) (dog) hair and dander: Secondary | ICD-10-CM | POA: Diagnosis not present

## 2021-11-11 DIAGNOSIS — Z1231 Encounter for screening mammogram for malignant neoplasm of breast: Secondary | ICD-10-CM | POA: Diagnosis not present

## 2021-11-11 LAB — HM MAMMOGRAPHY

## 2021-11-12 ENCOUNTER — Encounter: Payer: Self-pay | Admitting: Internal Medicine

## 2021-11-12 ENCOUNTER — Other Ambulatory Visit: Payer: Self-pay | Admitting: Internal Medicine

## 2021-11-12 ENCOUNTER — Ambulatory Visit (INDEPENDENT_AMBULATORY_CARE_PROVIDER_SITE_OTHER): Payer: Federal, State, Local not specified - PPO | Admitting: Internal Medicine

## 2021-11-12 VITALS — BP 110/80 | HR 94 | Temp 98.0°F | Ht 65.5 in | Wt 245.9 lb

## 2021-11-12 DIAGNOSIS — Z0000 Encounter for general adult medical examination without abnormal findings: Secondary | ICD-10-CM

## 2021-11-12 DIAGNOSIS — J4520 Mild intermittent asthma, uncomplicated: Secondary | ICD-10-CM

## 2021-11-12 DIAGNOSIS — E785 Hyperlipidemia, unspecified: Secondary | ICD-10-CM | POA: Diagnosis not present

## 2021-11-12 DIAGNOSIS — J3089 Other allergic rhinitis: Secondary | ICD-10-CM | POA: Diagnosis not present

## 2021-11-12 DIAGNOSIS — J301 Allergic rhinitis due to pollen: Secondary | ICD-10-CM | POA: Diagnosis not present

## 2021-11-12 DIAGNOSIS — E559 Vitamin D deficiency, unspecified: Secondary | ICD-10-CM

## 2021-11-12 DIAGNOSIS — I10 Essential (primary) hypertension: Secondary | ICD-10-CM

## 2021-11-12 DIAGNOSIS — J302 Other seasonal allergic rhinitis: Secondary | ICD-10-CM

## 2021-11-12 DIAGNOSIS — E538 Deficiency of other specified B group vitamins: Secondary | ICD-10-CM | POA: Diagnosis not present

## 2021-11-12 LAB — CBC WITH DIFFERENTIAL/PLATELET
Basophils Absolute: 0 10*3/uL (ref 0.0–0.1)
Basophils Relative: 0.4 % (ref 0.0–3.0)
Eosinophils Absolute: 0.2 10*3/uL (ref 0.0–0.7)
Eosinophils Relative: 3.3 % (ref 0.0–5.0)
HCT: 33.7 % — ABNORMAL LOW (ref 36.0–46.0)
Hemoglobin: 11.3 g/dL — ABNORMAL LOW (ref 12.0–15.0)
Lymphocytes Relative: 26.7 % (ref 12.0–46.0)
Lymphs Abs: 1.4 10*3/uL (ref 0.7–4.0)
MCHC: 33.6 g/dL (ref 30.0–36.0)
MCV: 86.1 fl (ref 78.0–100.0)
Monocytes Absolute: 0.4 10*3/uL (ref 0.1–1.0)
Monocytes Relative: 7.9 % (ref 3.0–12.0)
Neutro Abs: 3.2 10*3/uL (ref 1.4–7.7)
Neutrophils Relative %: 61.7 % (ref 43.0–77.0)
Platelets: 262 10*3/uL (ref 150.0–400.0)
RBC: 3.92 Mil/uL (ref 3.87–5.11)
RDW: 14.8 % (ref 11.5–15.5)
WBC: 5.1 10*3/uL (ref 4.0–10.5)

## 2021-11-12 LAB — VITAMIN D 25 HYDROXY (VIT D DEFICIENCY, FRACTURES): VITD: 22.81 ng/mL — ABNORMAL LOW (ref 30.00–100.00)

## 2021-11-12 LAB — COMPREHENSIVE METABOLIC PANEL
ALT: 17 U/L (ref 0–35)
AST: 20 U/L (ref 0–37)
Albumin: 4.2 g/dL (ref 3.5–5.2)
Alkaline Phosphatase: 98 U/L (ref 39–117)
BUN: 14 mg/dL (ref 6–23)
CO2: 27 mEq/L (ref 19–32)
Calcium: 9.6 mg/dL (ref 8.4–10.5)
Chloride: 103 mEq/L (ref 96–112)
Creatinine, Ser: 0.93 mg/dL (ref 0.40–1.20)
GFR: 69.61 mL/min (ref >60.00)
Glucose, Bld: 73 mg/dL (ref 70–99)
Potassium: 3.9 mEq/L (ref 3.5–5.1)
Sodium: 138 mEq/L (ref 135–145)
Total Bilirubin: 0.5 mg/dL (ref 0.2–1.2)
Total Protein: 8 g/dL (ref 6.0–8.3)

## 2021-11-12 LAB — HEMOGLOBIN A1C: Hgb A1c MFr Bld: 5.8 % (ref 4.6–6.5)

## 2021-11-12 LAB — LIPID PANEL
Cholesterol: 153 mg/dL (ref 0–200)
HDL: 60.4 mg/dL (ref >39.00)
LDL Cholesterol: 77 mg/dL (ref 0–99)
NonHDL: 92.39
Total CHOL/HDL Ratio: 3
Triglycerides: 77 mg/dL (ref 0.0–149.0)
VLDL: 15.4 mg/dL (ref 0.0–40.0)

## 2021-11-12 LAB — VITAMIN B12: Vitamin B-12: 441 pg/mL (ref 211–911)

## 2021-11-12 MED ORDER — VITAMIN D (ERGOCALCIFEROL) 1.25 MG (50000 UNIT) PO CAPS: 50000.0000 [IU] | ORAL_CAPSULE | ORAL | 0 refills | Status: AC

## 2021-11-12 NOTE — Progress Notes (Signed)
? ? ? ?Established Patient Office Visit ? ? ? ? ?This visit occurred during the SARS-CoV-2 public health emergency.  Safety protocols were in place, including screening questions prior to the visit, additional usage of staff PPE, and extensive cleaning of exam room while observing appropriate contact time as indicated for disinfecting solutions.  ? ? ?CC/Reason for Visit: Annual preventive exam ? ?HPI: AILSA MASSAQUOI is a 56 y.o. female who is coming in today for the above mentioned reasons. Past Medical History is significant for: Hypertension, hyperlipidemia, morbid obesity, vitamin D and B12, asthma and seasonal allergies.  She is feeling well and has no acute concerns.  She has routine eye and dental care all immunizations are up-to-date.  All cancer screening is up-to-date. ? ? ?Past Medical/Surgical History: ?Past Medical History:  ?Diagnosis Date  ? Allergy   ? ASCUS with positive high risk human papillomavirus of vagina 06/2013, 12/2016  ? Colposcopic biopsy koilocytotic atypia.  ? Asthma   ? Hypertension   ? Low grade squamous intraepithelial lesion (LGSIL) 06/2014, 06/2015, 06/2016, 06/2017, 12/2017, 07/2018  ? positive high-risk HPV. Colposcopic biopsy LGSIL negative ECC 2015,  ECC 2017 with koilocytotic atypia changes  ? ? ?Past Surgical History:  ?Procedure Laterality Date  ? BOIL UNDER ARM    ? ? ?Social History: ? reports that she has never smoked. She has never used smokeless tobacco. She reports that she does not drink alcohol and does not use drugs. ? ?Allergies: ?Allergies  ?Allergen Reactions  ? Doxycycline Hives  ? Metaxalone Hives  ? Penicillins Hives  ? Tramadol-Acetaminophen Hives  ? ? ?Family History:  ?Family History  ?Problem Relation Age of Onset  ? Diabetes Father   ? Hypertension Father   ? Colon polyps Father   ? Hypertension Mother   ? Cancer Mother   ?     Pancreatic  ? Colon polyps Mother   ? Rectal cancer Neg Hx   ? Stomach cancer Neg Hx   ? Colon cancer Neg Hx   ? ? ? ?Current  Outpatient Medications:  ?  albuterol (VENTOLIN HFA) 108 (90 Base) MCG/ACT inhaler, , Disp: , Rfl:  ?  atorvastatin (LIPITOR) 20 MG tablet, TAKE 1 TABLET BY MOUTH EVERY DAY, Disp: 90 tablet, Rfl: 1 ?  azelastine (ASTELIN) 0.1 % nasal spray, INSTILL 1 PUFF IN EACH NOSTRIL TWICE A DAY NASALLY 30 DAYS, Disp: , Rfl: 3 ?  cetirizine (ZYRTEC) 10 MG tablet, Take 10 mg by mouth daily., Disp: , Rfl:  ?  clobetasol cream (TEMOVATE) 0.05 %, Apply at bedtime as needed, Disp: 60 g, Rfl: 1 ?  EPIPEN 2-PAK 0.3 MG/0.3ML SOAJ injection, Reported on 09/24/2015, Disp: , Rfl: 0 ?  fexofenadine (ALLEGRA) 180 MG tablet, Take 180 mg by mouth daily., Disp: , Rfl:  ?  fluticasone (FLONASE) 50 MCG/ACT nasal spray, INSTILL 2 SPRAYS IN EACH NOSTRIL ONCE A DAY NASALLY 30 DAYS, Disp: , Rfl: 3 ?  guaifenesin (HUMIBID E) 400 MG TABS tablet, Take 1 tablet 3 times daily as needed for chest congestion and cough, Disp: 21 tablet, Rfl: 0 ?  ibuprofen (ADVIL) 400 MG tablet, Take 1 tablet (400 mg total) by mouth every 8 (eight) hours as needed for up to 30 doses., Disp: 30 tablet, Rfl: 0 ?  losartan (COZAAR) 50 MG tablet, TAKE 2 TABLETS BY MOUTH EVERY DAY, Disp: 180 tablet, Rfl: 0 ?  meloxicam (MOBIC) 7.5 MG tablet, Take 1 tablet (7.5 mg total) by mouth daily., Disp: 30 tablet,  Rfl: 0 ?  methylPREDNISolone (MEDROL DOSEPAK) 4 MG TBPK tablet, Take 24 mg on day 1, 20 mg on day 2, 16 mg on day 3, 12 mg on day 4, 8 mg on day 5, 4 mg on day 6., Disp: 21 tablet, Rfl: 0 ?  montelukast (SINGULAIR) 10 MG tablet, Take 10 mg by mouth at bedtime. , Disp: , Rfl:  ?  RESTASIS 0.05 % ophthalmic emulsion, INSTILL 1 DROP INTO BOTH EYES TWICE A DAY, Disp: , Rfl: 3 ?  SYMBICORT 80-4.5 MCG/ACT inhaler, , Disp: , Rfl:  ?  TYRVAYA 0.03 MG/ACT SOLN, Used for dry eye, Disp: , Rfl:  ?  UNABLE TO FIND, Allergy shots once a week, Disp: , Rfl:  ? ?Current Facility-Administered Medications:  ?  0.9 %  sodium chloride infusion, 500 mL, Intravenous, Once, Danis, Kirke Corin,  MD ? ?Review of Systems:  ?Constitutional: Denies fever, chills, diaphoresis, appetite change and fatigue.  ?HEENT: Denies photophobia, eye pain, redness, hearing loss, ear pain, congestion, sore throat, rhinorrhea, sneezing, mouth sores, trouble swallowing, neck pain, neck stiffness and tinnitus.   ?Respiratory: Denies SOB, DOE, cough, chest tightness,  and wheezing.   ?Cardiovascular: Denies chest pain, palpitations and leg swelling.  ?Gastrointestinal: Denies nausea, vomiting, abdominal pain, diarrhea, constipation, blood in stool and abdominal distention.  ?Genitourinary: Denies dysuria, urgency, frequency, hematuria, flank pain and difficulty urinating.  ?Endocrine: Denies: hot or cold intolerance, sweats, changes in hair or nails, polyuria, polydipsia. ?Musculoskeletal: Denies myalgias, back pain, joint swelling, arthralgias and gait problem.  ?Skin: Denies pallor, rash and wound.  ?Neurological: Denies dizziness, seizures, syncope, weakness, light-headedness, numbness and headaches.  ?Hematological: Denies adenopathy. Easy bruising, personal or family bleeding history  ?Psychiatric/Behavioral: Denies suicidal ideation, mood changes, confusion, nervousness, sleep disturbance and agitation ? ? ? ?Physical Exam: ?Vitals:  ? 11/12/21 1023  ?BP: 110/80  ?Pulse: 94  ?Temp: 98 ?F (36.7 ?C)  ?TempSrc: Oral  ?SpO2: 99%  ?Weight: 245 lb 14.4 oz (111.5 kg)  ?Height: 5' 5.5" (1.664 m)  ? ? ?Body mass index is 40.3 kg/m?. ? ? ? ?Constitutional: NAD, calm, comfortable ?Eyes: PERRL, lids and conjunctivae normal, wears corrective lenses ?ENMT: Mucous membranes are moist. Posterior pharynx clear of any exudate or lesions. Normal dentition. Tympanic membrane is pearly white, no erythema or bulging. ?Neck: normal, supple, no masses, no thyromegaly ?Respiratory: clear to auscultation bilaterally, no wheezing, no crackles. Normal respiratory effort. No accessory muscle use.  ?Cardiovascular: Regular rate and rhythm, no murmurs /  rubs / gallops. No extremity edema. 2+ pedal pulses. No carotid bruits.  ?Abdomen: no tenderness, no masses palpated. No hepatosplenomegaly. Bowel sounds positive.  ?Musculoskeletal: no clubbing / cyanosis. No joint deformity upper and lower extremities. Good ROM, no contractures. Normal muscle tone.  ?Skin: no rashes, lesions, ulcers. No induration ?Neurologic: CN 2-12 grossly intact. Sensation intact, DTR normal. Strength 5/5 in all 4.  ?Psychiatric: Normal judgment and insight. Alert and oriented x 3. Normal mood.  ? ? ?Impression and Plan: ? ?Encounter for preventive health examination ?-Recommend routine eye and dental care. ?-Immunizations: All immunizations are up-to-date and age-appropriate ?-Healthy lifestyle discussed in detail. ?-Labs to be updated today. ?-Colon cancer screening: 05/2018 ?-Breast cancer screening: 10/2021 ?-Cervical cancer screening: 2022 with GYN ?-Lung cancer screening: Not applicable ?-Prostate cancer screening: Not applicable ?-DEXA: Not applicable ? ?Morbid obesity ([place]) ? - Plan: Hemoglobin A1c ?-Discussed healthy lifestyle, including increased physical activity and better food choices to promote weight loss. ? ?Vitamin D deficiency ? -  Plan: VITAMIN D 25 Hydroxy (Vit-D Deficiency, Fractures) ? ?Vitamin B12 deficiency  ?- Plan: Vitamin B12 ? ?Essential hypertension ? - Plan: CBC with Differential/Platelet, Comprehensive metabolic panel ? ?Hyperlipidemia, unspecified hyperlipidemia type  ?- Plan: Lipid panel ? ?Seasonal allergies ?Mild intermittent chronic asthma without complication ?-Fairly well controlled, followed by allergist. ? ? ? ?Patient Instructions  ?-Nice seeing you today!! ? ?-Lab work today; will notify you once results are available. ? ?-Schedule follow up in 6 months or sooner as needed. ? ? ? ? ? ?Lelon Frohlich, MD ?[facility] ? ? ?

## 2021-11-12 NOTE — Patient Instructions (Signed)
-  Nice seeing you today!!  -Lab work today; will notify you once results are available.  -Schedule follow up in 6 months or sooner as needed.   

## 2021-11-13 ENCOUNTER — Other Ambulatory Visit: Payer: Self-pay | Admitting: Internal Medicine

## 2021-11-13 DIAGNOSIS — E559 Vitamin D deficiency, unspecified: Secondary | ICD-10-CM

## 2021-11-19 DIAGNOSIS — J301 Allergic rhinitis due to pollen: Secondary | ICD-10-CM | POA: Diagnosis not present

## 2021-11-19 DIAGNOSIS — H1045 Other chronic allergic conjunctivitis: Secondary | ICD-10-CM | POA: Diagnosis not present

## 2021-11-19 DIAGNOSIS — J3089 Other allergic rhinitis: Secondary | ICD-10-CM | POA: Diagnosis not present

## 2021-11-19 DIAGNOSIS — J4540 Moderate persistent asthma, uncomplicated: Secondary | ICD-10-CM | POA: Diagnosis not present

## 2021-11-20 ENCOUNTER — Encounter: Payer: Self-pay | Admitting: Internal Medicine

## 2021-11-26 DIAGNOSIS — J3081 Allergic rhinitis due to animal (cat) (dog) hair and dander: Secondary | ICD-10-CM | POA: Diagnosis not present

## 2021-11-26 DIAGNOSIS — J3089 Other allergic rhinitis: Secondary | ICD-10-CM | POA: Diagnosis not present

## 2021-11-26 DIAGNOSIS — J301 Allergic rhinitis due to pollen: Secondary | ICD-10-CM | POA: Diagnosis not present

## 2021-12-02 DIAGNOSIS — J301 Allergic rhinitis due to pollen: Secondary | ICD-10-CM | POA: Diagnosis not present

## 2021-12-02 DIAGNOSIS — J3089 Other allergic rhinitis: Secondary | ICD-10-CM | POA: Diagnosis not present

## 2021-12-10 DIAGNOSIS — J301 Allergic rhinitis due to pollen: Secondary | ICD-10-CM | POA: Diagnosis not present

## 2021-12-10 DIAGNOSIS — J3089 Other allergic rhinitis: Secondary | ICD-10-CM | POA: Diagnosis not present

## 2021-12-17 DIAGNOSIS — J3089 Other allergic rhinitis: Secondary | ICD-10-CM | POA: Diagnosis not present

## 2021-12-17 DIAGNOSIS — J301 Allergic rhinitis due to pollen: Secondary | ICD-10-CM | POA: Diagnosis not present

## 2021-12-25 DIAGNOSIS — J3089 Other allergic rhinitis: Secondary | ICD-10-CM | POA: Diagnosis not present

## 2021-12-25 DIAGNOSIS — J301 Allergic rhinitis due to pollen: Secondary | ICD-10-CM | POA: Diagnosis not present

## 2021-12-31 DIAGNOSIS — J301 Allergic rhinitis due to pollen: Secondary | ICD-10-CM | POA: Diagnosis not present

## 2021-12-31 DIAGNOSIS — J3089 Other allergic rhinitis: Secondary | ICD-10-CM | POA: Diagnosis not present

## 2022-01-07 DIAGNOSIS — J301 Allergic rhinitis due to pollen: Secondary | ICD-10-CM | POA: Diagnosis not present

## 2022-01-07 DIAGNOSIS — J3089 Other allergic rhinitis: Secondary | ICD-10-CM | POA: Diagnosis not present

## 2022-01-14 DIAGNOSIS — J301 Allergic rhinitis due to pollen: Secondary | ICD-10-CM | POA: Diagnosis not present

## 2022-01-14 DIAGNOSIS — J3089 Other allergic rhinitis: Secondary | ICD-10-CM | POA: Diagnosis not present

## 2022-01-21 DIAGNOSIS — J3081 Allergic rhinitis due to animal (cat) (dog) hair and dander: Secondary | ICD-10-CM | POA: Diagnosis not present

## 2022-01-21 DIAGNOSIS — J301 Allergic rhinitis due to pollen: Secondary | ICD-10-CM | POA: Diagnosis not present

## 2022-01-21 DIAGNOSIS — J3089 Other allergic rhinitis: Secondary | ICD-10-CM | POA: Diagnosis not present

## 2022-01-24 ENCOUNTER — Other Ambulatory Visit: Payer: Self-pay | Admitting: Internal Medicine

## 2022-01-24 DIAGNOSIS — I10 Essential (primary) hypertension: Secondary | ICD-10-CM

## 2022-01-26 DIAGNOSIS — J3089 Other allergic rhinitis: Secondary | ICD-10-CM | POA: Diagnosis not present

## 2022-01-26 DIAGNOSIS — J3081 Allergic rhinitis due to animal (cat) (dog) hair and dander: Secondary | ICD-10-CM | POA: Diagnosis not present

## 2022-01-26 DIAGNOSIS — J301 Allergic rhinitis due to pollen: Secondary | ICD-10-CM | POA: Diagnosis not present

## 2022-01-28 ENCOUNTER — Other Ambulatory Visit: Payer: Self-pay | Admitting: Internal Medicine

## 2022-01-28 DIAGNOSIS — E559 Vitamin D deficiency, unspecified: Secondary | ICD-10-CM

## 2022-02-02 DIAGNOSIS — J301 Allergic rhinitis due to pollen: Secondary | ICD-10-CM | POA: Diagnosis not present

## 2022-02-02 DIAGNOSIS — J3089 Other allergic rhinitis: Secondary | ICD-10-CM | POA: Diagnosis not present

## 2022-02-09 DIAGNOSIS — J3089 Other allergic rhinitis: Secondary | ICD-10-CM | POA: Diagnosis not present

## 2022-02-09 DIAGNOSIS — J301 Allergic rhinitis due to pollen: Secondary | ICD-10-CM | POA: Diagnosis not present

## 2022-02-19 DIAGNOSIS — J3089 Other allergic rhinitis: Secondary | ICD-10-CM | POA: Diagnosis not present

## 2022-02-19 DIAGNOSIS — J3081 Allergic rhinitis due to animal (cat) (dog) hair and dander: Secondary | ICD-10-CM | POA: Diagnosis not present

## 2022-02-19 DIAGNOSIS — J301 Allergic rhinitis due to pollen: Secondary | ICD-10-CM | POA: Diagnosis not present

## 2022-02-25 DIAGNOSIS — J3089 Other allergic rhinitis: Secondary | ICD-10-CM | POA: Diagnosis not present

## 2022-02-25 DIAGNOSIS — J301 Allergic rhinitis due to pollen: Secondary | ICD-10-CM | POA: Diagnosis not present

## 2022-03-05 DIAGNOSIS — J301 Allergic rhinitis due to pollen: Secondary | ICD-10-CM | POA: Diagnosis not present

## 2022-03-05 DIAGNOSIS — J3081 Allergic rhinitis due to animal (cat) (dog) hair and dander: Secondary | ICD-10-CM | POA: Diagnosis not present

## 2022-03-05 DIAGNOSIS — J3089 Other allergic rhinitis: Secondary | ICD-10-CM | POA: Diagnosis not present

## 2022-03-11 DIAGNOSIS — J301 Allergic rhinitis due to pollen: Secondary | ICD-10-CM | POA: Diagnosis not present

## 2022-03-11 DIAGNOSIS — J3081 Allergic rhinitis due to animal (cat) (dog) hair and dander: Secondary | ICD-10-CM | POA: Diagnosis not present

## 2022-03-11 DIAGNOSIS — J3089 Other allergic rhinitis: Secondary | ICD-10-CM | POA: Diagnosis not present

## 2022-03-17 DIAGNOSIS — J3089 Other allergic rhinitis: Secondary | ICD-10-CM | POA: Diagnosis not present

## 2022-03-17 DIAGNOSIS — J3081 Allergic rhinitis due to animal (cat) (dog) hair and dander: Secondary | ICD-10-CM | POA: Diagnosis not present

## 2022-03-17 DIAGNOSIS — J301 Allergic rhinitis due to pollen: Secondary | ICD-10-CM | POA: Diagnosis not present

## 2022-03-19 DIAGNOSIS — H5213 Myopia, bilateral: Secondary | ICD-10-CM | POA: Diagnosis not present

## 2022-03-19 DIAGNOSIS — H16223 Keratoconjunctivitis sicca, not specified as Sjogren's, bilateral: Secondary | ICD-10-CM | POA: Diagnosis not present

## 2022-03-19 DIAGNOSIS — H10413 Chronic giant papillary conjunctivitis, bilateral: Secondary | ICD-10-CM | POA: Diagnosis not present

## 2022-03-19 DIAGNOSIS — H40023 Open angle with borderline findings, high risk, bilateral: Secondary | ICD-10-CM | POA: Diagnosis not present

## 2022-03-26 DIAGNOSIS — J301 Allergic rhinitis due to pollen: Secondary | ICD-10-CM | POA: Diagnosis not present

## 2022-03-26 DIAGNOSIS — J3089 Other allergic rhinitis: Secondary | ICD-10-CM | POA: Diagnosis not present

## 2022-04-02 DIAGNOSIS — J3081 Allergic rhinitis due to animal (cat) (dog) hair and dander: Secondary | ICD-10-CM | POA: Diagnosis not present

## 2022-04-02 DIAGNOSIS — J3089 Other allergic rhinitis: Secondary | ICD-10-CM | POA: Diagnosis not present

## 2022-04-02 DIAGNOSIS — J301 Allergic rhinitis due to pollen: Secondary | ICD-10-CM | POA: Diagnosis not present

## 2022-04-09 DIAGNOSIS — J301 Allergic rhinitis due to pollen: Secondary | ICD-10-CM | POA: Diagnosis not present

## 2022-04-09 DIAGNOSIS — J3081 Allergic rhinitis due to animal (cat) (dog) hair and dander: Secondary | ICD-10-CM | POA: Diagnosis not present

## 2022-04-09 DIAGNOSIS — J3089 Other allergic rhinitis: Secondary | ICD-10-CM | POA: Diagnosis not present

## 2022-04-12 ENCOUNTER — Other Ambulatory Visit: Payer: Self-pay | Admitting: Internal Medicine

## 2022-04-12 DIAGNOSIS — E785 Hyperlipidemia, unspecified: Secondary | ICD-10-CM

## 2022-04-14 DIAGNOSIS — J3081 Allergic rhinitis due to animal (cat) (dog) hair and dander: Secondary | ICD-10-CM | POA: Diagnosis not present

## 2022-04-14 DIAGNOSIS — J301 Allergic rhinitis due to pollen: Secondary | ICD-10-CM | POA: Diagnosis not present

## 2022-04-14 DIAGNOSIS — J3089 Other allergic rhinitis: Secondary | ICD-10-CM | POA: Diagnosis not present

## 2022-04-15 DIAGNOSIS — J301 Allergic rhinitis due to pollen: Secondary | ICD-10-CM | POA: Diagnosis not present

## 2022-04-16 DIAGNOSIS — J3089 Other allergic rhinitis: Secondary | ICD-10-CM | POA: Diagnosis not present

## 2022-04-20 DIAGNOSIS — J3081 Allergic rhinitis due to animal (cat) (dog) hair and dander: Secondary | ICD-10-CM | POA: Diagnosis not present

## 2022-04-20 DIAGNOSIS — J301 Allergic rhinitis due to pollen: Secondary | ICD-10-CM | POA: Diagnosis not present

## 2022-04-20 DIAGNOSIS — J3089 Other allergic rhinitis: Secondary | ICD-10-CM | POA: Diagnosis not present

## 2022-05-04 ENCOUNTER — Ambulatory Visit
Admission: RE | Admit: 2022-05-04 | Discharge: 2022-05-04 | Disposition: A | Discharge status: Home / Self Care | Payer: Federal, State, Local not specified - PPO | Source: Ambulatory Visit | Attending: Emergency Medicine | Admitting: Emergency Medicine

## 2022-05-04 VITALS — BP 148/87 | HR 98 | Temp 98.2°F | Resp 20

## 2022-05-04 DIAGNOSIS — M7522 Bicipital tendinitis, left shoulder: Secondary | ICD-10-CM | POA: Diagnosis not present

## 2022-05-04 DIAGNOSIS — S46002A Unspecified injury of muscle(s) and tendon(s) of the rotator cuff of left shoulder, initial encounter: Secondary | ICD-10-CM | POA: Diagnosis not present

## 2022-05-04 MED ORDER — NAPROXEN 500 MG PO TABS: 500.0000 mg | ORAL_TABLET | Freq: Two times a day (BID) | ORAL | 0 refills | Status: DC

## 2022-05-04 MED ORDER — PREDNISONE 20 MG PO TABS: 40.0000 mg | ORAL_TABLET | Freq: Every day | ORAL | 0 refills | Status: DC

## 2022-05-04 MED ORDER — TIZANIDINE HCL 4 MG PO TABS: 4.0000 mg | ORAL_TABLET | Freq: Three times a day (TID) | ORAL | 0 refills | Status: DC | PRN

## 2022-05-04 MED ORDER — PREDNISONE 20 MG PO TABS: 40.0000 mg | ORAL_TABLET | Freq: Every day | ORAL | 0 refills | Status: AC

## 2022-05-04 NOTE — ED Provider Notes (Signed)
HPI  SUBJECTIVE:  Catherine Campbell is a left-handed 56 y.o. female who presents with daily, constant, throbbing, left shoulder pain that radiates to her upper arm after lifting a heavy object 1 week ago.  She states that her symptoms started later that day.  No fevers, chest pain, pressure, cough, wheeze, shortness of breath, radiation of this pain up her neck or through to her back.  No exertional component.  No shoulder erythema, edema, bruising.  No distal numbness or tingling, direct trauma to the shoulder, grip weakness, fevers.  She has tried ibuprofen, Tylenol arthritis, Tiger balm patch and Bengay without improvement in her symptoms.  Symptoms worse with using the shoulder and with lying flat.  She has a past medical history of asthma, hypertension.  No history of diabetes, coronary artery disease.  PCP: [facility].    Past Medical History:  Diagnosis Date   Allergy    ASCUS with positive high risk human papillomavirus of vagina 06/2013, 12/2016   Colposcopic biopsy koilocytotic atypia.   Asthma    Hypertension    Low grade squamous intraepithelial lesion (LGSIL) 06/2014, 06/2015, 06/2016, 06/2017, 12/2017, 07/2018   positive high-risk HPV. Colposcopic biopsy LGSIL negative ECC 2015,  ECC 2017 with koilocytotic atypia changes    Past Surgical History:  Procedure Laterality Date   BOIL UNDER ARM      Family History  Problem Relation Age of Onset   Diabetes Father    Hypertension Father    Colon polyps Father    Hypertension Mother    Cancer Mother        Pancreatic   Colon polyps Mother    Rectal cancer Neg Hx    Stomach cancer Neg Hx    Colon cancer Neg Hx     Social History   Tobacco Use   Smoking status: Never   Smokeless tobacco: Never  Vaping Use   Vaping Use: Never used  Substance Use Topics   Alcohol use: No    Alcohol/week: 0.0 standard drinks of alcohol   Drug use: No     Current Facility-Administered Medications:    0.9 %  sodium chloride  infusion, 500 mL, Intravenous, Once, Danis, Starr Lake III, MD  Current Outpatient Medications:    naproxen (NAPROSYN) 500 MG tablet, Take 1 tablet (500 mg total) by mouth 2 (two) times daily., Disp: 20 tablet, Rfl: 0   predniSONE (DELTASONE) 20 MG tablet, Take 2 tablets (40 mg total) by mouth daily with breakfast for 5 days., Disp: 10 tablet, Rfl: 0   tiZANidine (ZANAFLEX) 4 MG tablet, Take 1 tablet (4 mg total) by mouth every 8 (eight) hours as needed for muscle spasms., Disp: 30 tablet, Rfl: 0   albuterol (VENTOLIN HFA) 108 (90 Base) MCG/ACT inhaler, , Disp: , Rfl:    atorvastatin (LIPITOR) 20 MG tablet, TAKE 1 TABLET BY MOUTH EVERY DAY, Disp: 90 tablet, Rfl: 1   azelastine (ASTELIN) 0.1 % nasal spray, INSTILL 1 PUFF IN EACH NOSTRIL TWICE A DAY NASALLY 30 DAYS, Disp: , Rfl: 3   cetirizine (ZYRTEC) 10 MG tablet, Take 10 mg by mouth daily., Disp: , Rfl:    clobetasol cream (TEMOVATE) 0.05 %, Apply at bedtime as needed, Disp: 60 g, Rfl: 1   EPIPEN 2-PAK 0.3 MG/0.3ML SOAJ injection, Reported on 09/24/2015, Disp: , Rfl: 0   fexofenadine (ALLEGRA) 180 MG tablet, Take 180 mg by mouth daily., Disp: , Rfl:    fluticasone (FLONASE) 50 MCG/ACT nasal spray, INSTILL 2 SPRAYS  IN EACH NOSTRIL ONCE A DAY NASALLY 30 DAYS, Disp: , Rfl: 3   losartan (COZAAR) 50 MG tablet, TAKE 2 TABLETS BY MOUTH EVERY DAY, Disp: 180 tablet, Rfl: 1   montelukast (SINGULAIR) 10 MG tablet, Take 10 mg by mouth at bedtime. , Disp: , Rfl:    RESTASIS 0.05 % ophthalmic emulsion, INSTILL 1 DROP INTO BOTH EYES TWICE A DAY, Disp: , Rfl: 3   SYMBICORT 80-4.5 MCG/ACT inhaler, , Disp: , Rfl:    TYRVAYA 0.03 MG/ACT SOLN, Used for dry eye, Disp: , Rfl:    UNABLE TO FIND, Allergy shots once a week, Disp: , Rfl:   Allergies  Allergen Reactions   Doxycycline Hives   Metaxalone Hives   Penicillins Hives   Tramadol-Acetaminophen Hives     ROS  As noted in HPI.   Physical Exam  BP (!) 148/87   Pulse 98   Temp 98.2 F (36.8 C)   Resp  20   LMP 04/05/2020   SpO2 98%   Constitutional: Well developed, well nourished, no acute distress Eyes:  EOMI, conjunctiva normal bilaterally HENT: Normocephalic, atraumatic,mucus membranes moist Respiratory: Normal inspiratory effort Cardiovascular: Normal rate GI: nondistended skin: No rash, skin intact Musculoskeletal: L shoulder with ROM normal. Drop test painful but negative,  clavicle NT, A/C joint NT, scapula NT , proximal humerus NT, trapezius  tender with muscle spasm, shoulder joint  tender, Motor strength normal, Sensation intact LT over deltoid region, distal NVI with hand having intact sensation and strength in the median, radial, and ulnar nerve distribution.   Pain with internal rotation, pain  with external rotation, Positive tenderness in bicipital groove and over biceps, positive  empty can test,  negative liftoff test, pain but no instability with abduction/external rotation. RP 2+  Neurologic: Alert & oriented x 3, no focal neuro deficits Psychiatric: Speech and behavior appropriate  ED Course   Medications - No data to display  Orders Placed This Encounter  Procedures   AMB referral to sports medicine    Referral Priority:   Routine    Referral Type:   Consultation    Referred to Provider:   Gregor Hams, MD    Number of Visits Requested:   1    No results found for this or any previous visit (from the past 24 hour(s)). No results found.  ED Clinical Impression  1. Biceps tendinitis of left upper extremity   2. Injury of left rotator cuff, initial encounter      ED Assessment/Plan      Suspected biceps tendinitis/rotator cuff injury from lifting a heavy object.  Doubt ACS.  Home with Naprosyn/Tylenol, Zanaflex, prednisone 40 mg for 5 days, patient is to sleep with a pillow between her arm and her body to decompress the shoulder joint, will place referral to Dr. Lynne Leader, sports medicine.  Deferring imaging in the absence of trauma or bony  tenderness today.  Discussed MDM, treatment plan, and plan for follow-up with patient. . patient agrees with plan.   Meds ordered this encounter  Medications   tiZANidine (ZANAFLEX) 4 MG tablet    Sig: Take 1 tablet (4 mg total) by mouth every 8 (eight) hours as needed for muscle spasms.    Dispense:  30 tablet    Refill:  0   naproxen (NAPROSYN) 500 MG tablet    Sig: Take 1 tablet (500 mg total) by mouth 2 (two) times daily.    Dispense:  20 tablet    Refill:  0   predniSONE (DELTASONE) 20 MG tablet    Sig: Take 2 tablets (40 mg total) by mouth daily with breakfast for 5 days.    Dispense:  10 tablet    Refill:  0      *This clinic note was created using Scientist, clinical (histocompatibility and immunogenetics). Therefore, there may be occasional mistakes despite careful proofreading.  ?    Domenick Gong, MD 05/05/22 [phone]

## 2022-05-04 NOTE — ED Triage Notes (Signed)
Pt here with left aching shoulder and arm pin x 1 week that she woke up with

## 2022-05-04 NOTE — Discharge Instructions (Addendum)
I have put in a referral to Dr. Clementeen Graham, sports medicine for further evaluation and management.  Take the Naprosyn with 1000 mg of Tylenol twice a day.  Zanaflex as needed for muscle spasms.  Finish the prednisone, unless a healthcare provider tells you to stop.  Sleep with a pillow between your arm and your body to help decompress the shoulder joint.

## 2022-05-05 DIAGNOSIS — J3089 Other allergic rhinitis: Secondary | ICD-10-CM | POA: Diagnosis not present

## 2022-05-05 DIAGNOSIS — J301 Allergic rhinitis due to pollen: Secondary | ICD-10-CM | POA: Diagnosis not present

## 2022-05-05 DIAGNOSIS — H1045 Other chronic allergic conjunctivitis: Secondary | ICD-10-CM | POA: Diagnosis not present

## 2022-05-05 DIAGNOSIS — J4540 Moderate persistent asthma, uncomplicated: Secondary | ICD-10-CM | POA: Diagnosis not present

## 2022-05-12 DIAGNOSIS — J3089 Other allergic rhinitis: Secondary | ICD-10-CM | POA: Diagnosis not present

## 2022-05-12 DIAGNOSIS — J301 Allergic rhinitis due to pollen: Secondary | ICD-10-CM | POA: Diagnosis not present

## 2022-05-12 DIAGNOSIS — J3081 Allergic rhinitis due to animal (cat) (dog) hair and dander: Secondary | ICD-10-CM | POA: Diagnosis not present

## 2022-05-14 ENCOUNTER — Ambulatory Visit: Payer: Federal, State, Local not specified - PPO | Admitting: Sports Medicine

## 2022-05-14 VITALS — BP 132/82 | HR 102 | Ht 65.0 in | Wt 254.0 lb

## 2022-05-14 DIAGNOSIS — M25512 Pain in left shoulder: Secondary | ICD-10-CM | POA: Diagnosis not present

## 2022-05-14 DIAGNOSIS — M67912 Unspecified disorder of synovium and tendon, left shoulder: Secondary | ICD-10-CM | POA: Diagnosis not present

## 2022-05-14 NOTE — Progress Notes (Signed)
Aleen Sells D.[facility] [address] [place] [address] Phone: [phone]   Assessment and Plan:     1. Acute pain of left shoulder 2. Tendinopathy of left rotator cuff -Acute, uncomplicated, initial sports medicine visit - Consistent with rotator cuff tendinopathy of left shoulder based on HPI, physical exam - Patient elected for subacromial CSI.  Tolerated well per note below - Start HEP for shoulder - May discontinue naproxen and prednisone at this time - May continue to use tizanidine as needed for muscle spasms at night - Recommend using Tylenol/NSAIDs as needed for day-to-day pain relief   Procedure: Subacromial Injection Side: Left  Risks explained and consent was given verbally. The site was cleaned with alcohol prep. A steroid injection was performed from posterior approach using 75mL of 1% lidocaine without epinephrine and 50mL of kenalog 40mg /ml. This was well tolerated and resulted in symptomatic relief.  Needle was removed, hemostasis achieved, and post injection instructions were explained.   Pt was advised to call or return to clinic if these symptoms worsen or fail to improve as anticipated.   Pertinent previous records reviewed include ER note 05/04/2022   Follow Up: 3 weeks for reevaluation to ensure improvement after CSI.  If no improvement or worsening of symptoms, would obtain x-ray at that time   Subjective:   I, Moenique Parris, am serving as a 07/04/2022 for Doctor Neurosurgeon  Chief Complaint: left shoulder and arm pain   HPI:   05/14/22 Patient is a 56 year old female complaining of left shoulder and arm pain. She Patient states she has been in pain since labor day, she hasnt been able to get good sleep due to pain, sleeps with a pillow under her arm and still painful, no MOI, no numbness or tingling, been taking tylenol and naproxen and tizanidine and those dont really help just make her sleepy, posterior  shoulder pain that radiates to the front and down the arm, she is left hand dominate , decreased ROM   Relevant Historical Information: Hypertension  Additional pertinent review of systems negative.   Current Outpatient Medications:    albuterol (VENTOLIN HFA) 108 (90 Base) MCG/ACT inhaler, , Disp: , Rfl:    atorvastatin (LIPITOR) 20 MG tablet, TAKE 1 TABLET BY MOUTH EVERY DAY, Disp: 90 tablet, Rfl: 1   azelastine (ASTELIN) 0.1 % nasal spray, INSTILL 1 PUFF IN EACH NOSTRIL TWICE A DAY NASALLY 30 DAYS, Disp: , Rfl: 3   cetirizine (ZYRTEC) 10 MG tablet, Take 10 mg by mouth daily., Disp: , Rfl:    clobetasol cream (TEMOVATE) 0.05 %, Apply at bedtime as needed, Disp: 60 g, Rfl: 1   EPIPEN 2-PAK 0.3 MG/0.3ML SOAJ injection, Reported on 09/24/2015, Disp: , Rfl: 0   fexofenadine (ALLEGRA) 180 MG tablet, Take 180 mg by mouth daily., Disp: , Rfl:    fluticasone (FLONASE) 50 MCG/ACT nasal spray, INSTILL 2 SPRAYS IN EACH NOSTRIL ONCE A DAY NASALLY 30 DAYS, Disp: , Rfl: 3   losartan (COZAAR) 50 MG tablet, TAKE 2 TABLETS BY MOUTH EVERY DAY, Disp: 180 tablet, Rfl: 1   montelukast (SINGULAIR) 10 MG tablet, Take 10 mg by mouth at bedtime. , Disp: , Rfl:    naproxen (NAPROSYN) 500 MG tablet, Take 1 tablet (500 mg total) by mouth 2 (two) times daily., Disp: 20 tablet, Rfl: 0   RESTASIS 0.05 % ophthalmic emulsion, INSTILL 1 DROP INTO BOTH EYES TWICE A DAY, Disp: , Rfl: 3  SYMBICORT 80-4.5 MCG/ACT inhaler, , Disp: , Rfl:    tiZANidine (ZANAFLEX) 4 MG tablet, Take 1 tablet (4 mg total) by mouth every 8 (eight) hours as needed for muscle spasms., Disp: 30 tablet, Rfl: 0   TYRVAYA 0.03 MG/ACT SOLN, Used for dry eye, Disp: , Rfl:    UNABLE TO FIND, Allergy shots once a week, Disp: , Rfl:   Current Facility-Administered Medications:    0.9 %  sodium chloride infusion, 500 mL, Intravenous, Once, Danis, Estill Cotta III, MD   Objective:     Vitals:   05/14/22 1314  BP: 132/82  Pulse: (!) 102  SpO2: 100%  Weight:  254 lb (115.2 kg)  Height: 5\' 5"  (1.651 m)      Body mass index is 42.27 kg/m.    Physical Exam:    Gen: Appears well, nad, nontoxic and pleasant Neuro:sensation intact, strength is 5/5 with df/pf/inv/ev, muscle tone wnl Skin: no suspicious lesion or defmority Psych: A&O, appropriate mood and affect  Left shoulder: no deformity, swelling or muscle wasting No scapular winging FF 140 with painful arc, abd 170 with painful arc, int 10, ext 80 NTTP over the [place], clavicle, ac, coracoid, biceps groove, humerus, deltoid, trapezius, cervical spine Positive Neer's, Hawkins, empty can, O'Brien, crossarm Neg subscap liftoff, speeds,  Neg ant drawer, sulcus sign, apprehension Negative Spurling's test bilat FROM of neck    Electronically signed by:  Benito Mccreedy D.[facility] 1:31 PM 05/14/22

## 2022-05-14 NOTE — Patient Instructions (Addendum)
Good to see you Shoulder HEP 3 week follow up  

## 2022-05-18 DIAGNOSIS — J3081 Allergic rhinitis due to animal (cat) (dog) hair and dander: Secondary | ICD-10-CM | POA: Diagnosis not present

## 2022-05-18 DIAGNOSIS — J301 Allergic rhinitis due to pollen: Secondary | ICD-10-CM | POA: Diagnosis not present

## 2022-05-18 DIAGNOSIS — J3089 Other allergic rhinitis: Secondary | ICD-10-CM | POA: Diagnosis not present

## 2022-05-25 DIAGNOSIS — J3089 Other allergic rhinitis: Secondary | ICD-10-CM | POA: Diagnosis not present

## 2022-05-25 DIAGNOSIS — J3081 Allergic rhinitis due to animal (cat) (dog) hair and dander: Secondary | ICD-10-CM | POA: Diagnosis not present

## 2022-05-25 DIAGNOSIS — J301 Allergic rhinitis due to pollen: Secondary | ICD-10-CM | POA: Diagnosis not present

## 2022-06-03 DIAGNOSIS — J301 Allergic rhinitis due to pollen: Secondary | ICD-10-CM | POA: Diagnosis not present

## 2022-06-03 DIAGNOSIS — J3089 Other allergic rhinitis: Secondary | ICD-10-CM | POA: Diagnosis not present

## 2022-06-04 NOTE — Progress Notes (Signed)
Benito Mccreedy D.[facility] [place] Phone: [phone]   Assessment and Plan:    1. Acute pain of left shoulder 2. Tendinopathy of left rotator cuff -Acute, mild improvement, subsequent visit - Improvement in range of motion and mild improvement in pain after subacromial CSI on 05/14/2022, however patient continues to have pain most consistent with rotator cuff tendinopathy versus impingement syndrome likely exacerbated by patient being left-hand-dominant - Start meloxicam 15 mg daily x2 weeks.  If still having pain after 2 weeks, complete 3rd-week of meloxicam. May use remaining meloxicam as needed once daily for pain control.  Do not to use additional NSAIDs while taking meloxicam.  May use Tylenol [phone] mg 2 to 3 times a day for breakthrough pain. - Continue HEP and start physical therapy - Ambulatory referral to Physical Therapy  Other orders - meloxicam (MOBIC) 15 MG tablet; Take 1 tablet (15 mg total) by mouth daily.    Pertinent previous records reviewed include none   Follow Up: 4 weeks for reevaluation.  If no improvement or worsening of symptoms would obtain x-ray at that time   Subjective:   I, Hypoluxo, am serving as a Education administrator for Doctor Glennon Mac   Chief Complaint: left shoulder and arm pain    HPI:    05/14/22 Patient is a 56 year old female complaining of left shoulder and arm pain. She Patient states she has been in pain since labor day, she hasnt been able to get good sleep due to pain, sleeps with a pillow under her arm and still painful, no MOI, no numbness or tingling, been taking tylenol and naproxen and tizanidine and those dont really help just make her sleepy, posterior shoulder pain that radiates to the front and down the arm, she is left hand dominate , decreased ROM   06/05/2022 Patient states that she is good , shoulder still giving her a little problem  when she uses her  arm her thumb will get tingly     Relevant Historical Information: Hypertension  Additional pertinent review of systems negative.   Current Outpatient Medications:    albuterol (VENTOLIN HFA) 108 (90 Base) MCG/ACT inhaler, , Disp: , Rfl:    atorvastatin (LIPITOR) 20 MG tablet, TAKE 1 TABLET BY MOUTH EVERY DAY, Disp: 90 tablet, Rfl: 1   azelastine (ASTELIN) 0.1 % nasal spray, INSTILL 1 PUFF IN EACH NOSTRIL TWICE A DAY NASALLY 30 DAYS, Disp: , Rfl: 3   cetirizine (ZYRTEC) 10 MG tablet, Take 10 mg by mouth daily., Disp: , Rfl:    clobetasol cream (TEMOVATE) 0.05 %, Apply at bedtime as needed, Disp: 60 g, Rfl: 1   EPIPEN 2-PAK 0.3 MG/0.3ML SOAJ injection, Reported on 09/24/2015, Disp: , Rfl: 0   fexofenadine (ALLEGRA) 180 MG tablet, Take 180 mg by mouth daily., Disp: , Rfl:    fluticasone (FLONASE) 50 MCG/ACT nasal spray, INSTILL 2 SPRAYS IN EACH NOSTRIL ONCE A DAY NASALLY 30 DAYS, Disp: , Rfl: 3   losartan (COZAAR) 50 MG tablet, TAKE 2 TABLETS BY MOUTH EVERY DAY, Disp: 180 tablet, Rfl: 1   meloxicam (MOBIC) 15 MG tablet, Take 1 tablet (15 mg total) by mouth daily., Disp: 30 tablet, Rfl: 0   montelukast (SINGULAIR) 10 MG tablet, Take 10 mg by mouth at bedtime. , Disp: , Rfl:    naproxen (NAPROSYN) 500 MG tablet, Take 1 tablet (500 mg total) by mouth 2 (two) times daily., Disp: 20 tablet, Rfl:  0   RESTASIS 0.05 % ophthalmic emulsion, INSTILL 1 DROP INTO BOTH EYES TWICE A DAY, Disp: , Rfl: 3   SYMBICORT 80-4.5 MCG/ACT inhaler, , Disp: , Rfl:    tiZANidine (ZANAFLEX) 4 MG tablet, Take 1 tablet (4 mg total) by mouth every 8 (eight) hours as needed for muscle spasms., Disp: 30 tablet, Rfl: 0   TYRVAYA 0.03 MG/ACT SOLN, Used for dry eye, Disp: , Rfl:    UNABLE TO FIND, Allergy shots once a week, Disp: , Rfl:   Current Facility-Administered Medications:    0.9 %  sodium chloride infusion, 500 mL, Intravenous, Once, Danis, Starr Lake III, MD   Objective:     Vitals:   06/05/22 1259  BP: 128/68   Pulse: 76  SpO2: 99%  Weight: 246 lb (111.6 kg)  Height: 5\' 5"  (1.651 m)      Body mass index is 40.94 kg/m.    Physical Exam:    Gen: Appears well, nad, nontoxic and pleasant Neuro:sensation intact, strength is 5/5 with df/pf/inv/ev, muscle tone wnl Skin: no suspicious lesion or defmority Psych: A&O, appropriate mood and affect  Left shoulder: no deformity, swelling or muscle wasting No scapular winging FF 180, abd 180, int 0, ext 90 NTTP over the [place], clavicle, ac, coracoid, biceps groove, humerus, deltoid, trapezius, cervical spine Positive Hawking's, empty can, O'Brien, crossarm Neg neer,   subscap liftoff, speeds,   Neg ant drawer, sulcus sign, apprehension Negative Spurling's test bilat FROM of neck    Electronically signed by:  D.Aleen Sells Sports [facility] 1:12 PM 06/05/22

## 2022-06-05 ENCOUNTER — Ambulatory Visit: Payer: Federal, State, Local not specified - PPO | Admitting: Sports Medicine

## 2022-06-05 VITALS — BP 128/68 | HR 76 | Ht 65.0 in | Wt 246.0 lb

## 2022-06-05 DIAGNOSIS — M67912 Unspecified disorder of synovium and tendon, left shoulder: Secondary | ICD-10-CM | POA: Diagnosis not present

## 2022-06-05 DIAGNOSIS — M25512 Pain in left shoulder: Secondary | ICD-10-CM

## 2022-06-05 MED ORDER — MELOXICAM 15 MG PO TABS: 15.0000 mg | ORAL_TABLET | Freq: Every day | ORAL | 0 refills | Status: DC

## 2022-06-05 NOTE — Patient Instructions (Addendum)
Good to see you  - Start meloxicam 15 mg daily x2 weeks.  If still having pain after 2 weeks, complete 3rd-week of meloxicam. May use remaining meloxicam as needed once daily for pain control.  Do not to use additional NSAIDs while taking meloxicam.  May use Tylenol [phone] mg 2 to 3 times a day for breakthrough pain. PT referral  4 week follow up

## 2022-06-11 DIAGNOSIS — J301 Allergic rhinitis due to pollen: Secondary | ICD-10-CM | POA: Diagnosis not present

## 2022-06-11 DIAGNOSIS — J3089 Other allergic rhinitis: Secondary | ICD-10-CM | POA: Diagnosis not present

## 2022-06-15 DIAGNOSIS — J301 Allergic rhinitis due to pollen: Secondary | ICD-10-CM | POA: Diagnosis not present

## 2022-06-15 DIAGNOSIS — J3081 Allergic rhinitis due to animal (cat) (dog) hair and dander: Secondary | ICD-10-CM | POA: Diagnosis not present

## 2022-06-15 DIAGNOSIS — J3089 Other allergic rhinitis: Secondary | ICD-10-CM | POA: Diagnosis not present

## 2022-06-15 DIAGNOSIS — Z23 Encounter for immunization: Secondary | ICD-10-CM | POA: Diagnosis not present

## 2022-06-17 ENCOUNTER — Encounter: Payer: Self-pay | Admitting: Physical Therapy

## 2022-06-17 ENCOUNTER — Other Ambulatory Visit: Payer: Self-pay

## 2022-06-17 ENCOUNTER — Ambulatory Visit: Payer: Federal, State, Local not specified - PPO | Attending: Sports Medicine | Admitting: Physical Therapy

## 2022-06-17 DIAGNOSIS — G8929 Other chronic pain: Secondary | ICD-10-CM | POA: Diagnosis not present

## 2022-06-17 DIAGNOSIS — M25512 Pain in left shoulder: Secondary | ICD-10-CM | POA: Diagnosis not present

## 2022-06-17 DIAGNOSIS — M67912 Unspecified disorder of synovium and tendon, left shoulder: Secondary | ICD-10-CM | POA: Insufficient documentation

## 2022-06-17 DIAGNOSIS — M6281 Muscle weakness (generalized): Secondary | ICD-10-CM | POA: Diagnosis not present

## 2022-06-17 NOTE — Therapy (Signed)
OUTPATIENT PHYSICAL THERAPY EVALUATION   Patient Name: Catherine Campbell MRN: [id number] DOB:01-29-1966, 56 y.o., female Today's Date: 06/17/2022   PT End of Session - 06/17/22 1730     Visit Number 1    Number of Visits 9    Date for PT Re-Evaluation 08/12/22    Authorization Type [employer]    PT Start Time 1615    PT Stop Time 1700    PT Time Calculation (min) 45 min    Activity Tolerance Patient tolerated treatment well    Behavior During Therapy [facility] for tasks assessed/performed             Past Medical History:  Diagnosis Date   Allergy    ASCUS with positive high risk human papillomavirus of vagina 06/2013, 12/2016   Colposcopic biopsy koilocytotic atypia.   Asthma    Hypertension    Low grade squamous intraepithelial lesion (LGSIL) 06/2014, 06/2015, 06/2016, 06/2017, 12/2017, 07/2018   positive high-risk HPV. Colposcopic biopsy LGSIL negative ECC 2015,  ECC 2017 with koilocytotic atypia changes   Past Surgical History:  Procedure Laterality Date   BOIL UNDER ARM     Patient Active Problem List   Diagnosis Date Noted   Morbid obesity (HCC) 02/06/2020   Vitamin D deficiency 04/04/2019   Vitamin B12 deficiency 04/04/2019   Seasonal allergies 03/31/2018   Asthma, chronic 08/30/2013   RECTAL BLEEDING 07/30/2010   Hyperlipidemia 12/06/2009   Allergic rhinitis 06/03/2009   VAGINITIS 01/09/2008   Acute upper respiratory infection 09/12/2007   Essential hypertension 05/24/2007    PCP: [name], Limmie Patricia, MD  REFERRING PROVIDER: Richardean Sale, DO  REFERRING DIAG: Acute pain of left shoulder, Tendinopathy of left rotator cuff  THERAPY DIAG:  Chronic left shoulder pain  Muscle weakness (generalized)  Rationale for Evaluation and Treatment Rehabilitation  ONSET DATE: Beginning of September 2023 (approx 2 months ago)   SUBJECTIVE:                                                                                                                                                                                      SUBJECTIVE STATEMENT: Patient reports left shoulder hurts and she can't lay down to sleep, and she gets a pain that goes all the way down to her thumb that mainly occurs first thing in the morning or when she uses it repetitively. She states one day she was moving her mattress to put on a fitted sheet and she felt the pain in her shoulder. She did have an injection in the shoulder with minimal relief, and she was given some medication that seems to be helping. Denies any numbness or tingling.  Patient is left handed.   PERTINENT HISTORY: None  PAIN:  Are you having pain? Yes:  NPRS scale: 5/10 Pain location: Left shoulder, radiating to lateral upper arm down to left hand Pain description: Dull at rest, burning with movement Aggravating factors: Repetitive movement of the left shoulder Relieving factors: Medication, rest  PRECAUTIONS: None  WEIGHT BEARING RESTRICTIONS: No  FALLS:  Has patient fallen in last 6 months? No  OCCUPATION: Mainly desk work  PLOF: Independent  PATIENT GOALS: Wants the pain to stop   OBJECTIVE:  DIAGNOSTIC FINDINGS:  None  PATIENT SURVEYS:  FOTO 65% functional status  COGNITION: Overall cognitive status: Within functional limits for tasks assessed     SENSATION: WFL  POSTURE: Mild rounded shoulder and forward head posture  UPPER EXTREMITY ROM:     Cervical ROM non painful  Active ROM Right eval Left eval  Shoulder flexion 160 140  Shoulder internal rotation T8 L1  Shoulder external rotation T2 C7   UPPER EXTREMITY MMT:  MMT Right eval Left eval  Shoulder flexion 5 4  Shoulder extension 5 4+  Shoulder abduction 5 4  Shoulder internal rotation 5 5  Shoulder external rotation 5 4  Middle trapezius - -  Lower trapezius - -  Elbow flexion 5 5  Elbow extension 5 5  Grip strength (lbs) 45 43  (Blank rows = not tested)  SHOULDER SPECIAL TESTS: Impingement testing  positive Radicular testing negative  JOINT MOBILITY TESTING:  Left GHJ hypomobility and guarding   PALPATION:  Tenderness periacromial region and posterior cuff    TODAY'S TREATMENT: [facility] Adult PT Treatment:                                                DATE: 06/17/2022 Therapeutic Exercise: Single arm row with red 2 x 20 ER with red 2 x 20  PATIENT EDUCATION: Education details: Exam findings, POC, HEP Person educated: Patient Education method: Explanation, Demonstration, Tactile cues, Verbal cues, and Handouts Education comprehension: verbalized understanding, returned demonstration, verbal cues required, tactile cues required, and needs further education  HOME EXERCISE PROGRAM: Access Code: [email or web address]   ASSESSMENT: CLINICAL IMPRESSION: Patient is a 56 y.o. female who was seen today for physical therapy evaluation and treatment for chronic left shoulder pain. She is reporting lateral upper arm pain that does seem to be rotator cuff related as she exhibits limitation in active motion and strength deficits with pain during testing, and she is report pain that radiates to the left thumb which is unclear etiology as cervical screen and radicular testing were negative.   OBJECTIVE IMPAIRMENTS: decreased activity tolerance, decreased ROM, decreased strength, postural dysfunction, and pain.   ACTIVITY LIMITATIONS: carrying, lifting, sleeping, bathing, dressing, reach over head, and hygiene/grooming  PARTICIPATION LIMITATIONS: meal prep, cleaning, laundry, shopping, and occupation  PERSONAL FACTORS: Fitness, Past/current experiences, and Time since onset of injury/illness/exacerbation are also affecting patient's functional outcome.   REHAB POTENTIAL: Good  CLINICAL DECISION MAKING: Stable/uncomplicated  EVALUATION COMPLEXITY: Low   GOALS: Goals reviewed with patient? Yes  SHORT TERM GOALS: Target date: 07/15/2022  Patient will be I with initial HEP in order to progress with  therapy. Baseline: HEP provided at eval Goal status: INITIAL  2.  PT will review FOTO with patient by 3rd visit in order to understand expected progress and outcome with therapy. Baseline: FOTO assessed at  eval Goal status: INITIAL  3.  Patient will report left shoulder pain </= 2/10 in order to reduce functional limitations Baseline: 5/10 pain level Goal status: INITIAL  LONG TERM GOALS: Target date: 08/12/2022  Patient will be I with final HEP to maintain progress from PT. Baseline: HEP provided at eval Goal status: INITIAL  2.  Patient will report >/= 73% status on FOTO to indicate improved functional ability. Baseline: 65% functional status Goal status: INITIAL  3.  Patient will demonstrate left shoulder active elevation >/= 160 deg to improve overhead reach and dressing ability Baseline: 140 deg Goal status: INITIAL  4.  Patient exhibit left shoulder strength 5/5 MMT in order to reduce pain with repetitive shoulder activity and improve lifting ability Baseline: patient demonstrates left shoulder strength deficits (see above) Goal status: INITIAL   PLAN: PT FREQUENCY: 1x/week  PT DURATION: 8 weeks  PLANNED INTERVENTIONS: Therapeutic exercises, Therapeutic activity, Neuromuscular re-education, Balance training, Gait training, Patient/Family education, Self Care, Joint mobilization, Joint manipulation, Aquatic Therapy, Dry Needling, Electrical stimulation, Spinal manipulation, Spinal mobilization, Cryotherapy, Moist heat, Taping, Ionotophoresis 4mg /ml Dexamethasone, Manual therapy, and Re-evaluation  PLAN FOR NEXT SESSION: Review HEP and progress PRN, manual and stretching for left shoulder to improve motion, rotator cuff and parascapular muscle strengthening as tolerated   Rosana Hoes, PT, DPT, LAT, ATC 06/17/22  5:41 PM Phone: [phone] Fax: [phone]

## 2022-06-17 NOTE — Patient Instructions (Signed)
Access Code: [email or web address] URL: [email or web address] Date: 06/17/2022 Prepared by: Hilda Blades  Exercises - Standing Single Arm Row with Resistance Thumb Up  - 1 x daily - 2-3 sets - 20 reps - Shoulder External Rotation with Anchored Resistance  - 1 x daily - 2-3 sets - 20 reps

## 2022-06-22 DIAGNOSIS — J301 Allergic rhinitis due to pollen: Secondary | ICD-10-CM | POA: Diagnosis not present

## 2022-06-22 DIAGNOSIS — J3089 Other allergic rhinitis: Secondary | ICD-10-CM | POA: Diagnosis not present

## 2022-06-22 DIAGNOSIS — J3081 Allergic rhinitis due to animal (cat) (dog) hair and dander: Secondary | ICD-10-CM | POA: Diagnosis not present

## 2022-06-25 ENCOUNTER — Ambulatory Visit: Payer: Federal, State, Local not specified - PPO | Admitting: Physical Therapy

## 2022-06-25 NOTE — Therapy (Incomplete)
OUTPATIENT PHYSICAL THERAPY TREATMENT NOTE   Patient Name: Catherine Campbell MRN: [id number] DOB:1966/05/22, 56 y.o., female Today's Date: 06/25/2022  PCP: [name], Limmie Patricia, MD   REFERRING PROVIDER: Richardean Sale, DO   END OF SESSION:    Past Medical History:  Diagnosis Date   Allergy    ASCUS with positive high risk human papillomavirus of vagina 06/2013, 12/2016   Colposcopic biopsy koilocytotic atypia.   Asthma    Hypertension    Low grade squamous intraepithelial lesion (LGSIL) 06/2014, 06/2015, 06/2016, 06/2017, 12/2017, 07/2018   positive high-risk HPV. Colposcopic biopsy LGSIL negative ECC 2015,  ECC 2017 with koilocytotic atypia changes   Past Surgical History:  Procedure Laterality Date   BOIL UNDER ARM     Patient Active Problem List   Diagnosis Date Noted   Morbid obesity (HCC) 02/06/2020   Vitamin D deficiency 04/04/2019   Vitamin B12 deficiency 04/04/2019   Seasonal allergies 03/31/2018   Asthma, chronic 08/30/2013   RECTAL BLEEDING 07/30/2010   Hyperlipidemia 12/06/2009   Allergic rhinitis 06/03/2009   VAGINITIS 01/09/2008   Acute upper respiratory infection 09/12/2007   Essential hypertension 05/24/2007    REFERRING DIAG: Acute pain of left shoulder, Tendinopathy of left rotator cuff   THERAPY DIAG:  No diagnosis found.  Rationale for Evaluation and Treatment Rehabilitation  PERTINENT HISTORY: None   PRECAUTIONS: None    SUBJECTIVE:        SUBJECTIVE STATEMENT:  ***  PAIN:  Are you having pain? Yes:  NPRS scale: 5/10 Pain location: Left shoulder, radiating to lateral upper arm down to left hand Pain description: Dull at rest, burning with movement Aggravating factors: Repetitive movement of the left shoulder Relieving factors: Medication, rest  PATIENT GOALS: Wants the pain to stop    OBJECTIVE: (objective measures completed at initial evaluation unless otherwise dated) PATIENT SURVEYS:  FOTO 65% functional status    POSTURE: Mild rounded shoulder and forward head posture   UPPER EXTREMITY ROM:                          Cervical ROM non painful   Active ROM Right eval Left eval  Shoulder flexion 160 140  Shoulder internal rotation T8 L1  Shoulder external rotation T2 C7    UPPER EXTREMITY MMT:   MMT Right eval Left eval  Shoulder flexion 5 4  Shoulder extension 5 4+  Shoulder abduction 5 4  Shoulder internal rotation 5 5  Shoulder external rotation 5 4  Middle trapezius - -  Lower trapezius - -  Elbow flexion 5 5  Elbow extension 5 5  Grip strength (lbs) 45 43  (Blank rows = not tested)   SHOULDER SPECIAL TESTS: Impingement testing positive Radicular testing negative   JOINT MOBILITY TESTING:  Left GHJ hypomobility and guarding    PALPATION:  Tenderness periacromial region and posterior cuff               TODAY'S TREATMENT: [facility] Adult PT Treatment:                                                DATE: 06/25/2022 Therapeutic Exercise: Single arm row with red 2 x 20 ER with red 2 x 20   [facility] Adult PT Treatment:  DATE: 06/17/2022 Therapeutic Exercise: Single arm row with red 2 x 20 ER with red 2 x 20   PATIENT EDUCATION: Education details: Exam findings, POC, HEP Person educated: Patient Education method: Explanation, Demonstration, Tactile cues, Verbal cues, and Handouts Education comprehension: verbalized understanding, returned demonstration, verbal cues required, tactile cues required, and needs further education   HOME EXERCISE PROGRAM: Access Code: [email or web address]     ASSESSMENT: CLINICAL IMPRESSION: Patient tolerated therapy well with no adverse effects. *** Patient would benefit from continued skilled PT to progress her mobility and strength in order to reduce pain and maximize functional ability.  Patient is a 56 y.o. female who was seen today for physical therapy evaluation and treatment for chronic left shoulder  pain. She is reporting lateral upper arm pain that does seem to be rotator cuff related as she exhibits limitation in active motion and strength deficits with pain during testing, and she is report pain that radiates to the left thumb which is unclear etiology as cervical screen and radicular testing were negative.    OBJECTIVE IMPAIRMENTS: decreased activity tolerance, decreased ROM, decreased strength, postural dysfunction, and pain.    ACTIVITY LIMITATIONS: carrying, lifting, sleeping, bathing, dressing, reach over head, and hygiene/grooming   PARTICIPATION LIMITATIONS: meal prep, cleaning, laundry, shopping, and occupation   PERSONAL FACTORS: Fitness, Past/current experiences, and Time since onset of injury/illness/exacerbation are also affecting patient's functional outcome.      GOALS: Goals reviewed with patient? Yes   SHORT TERM GOALS: Target date: 07/15/2022   Patient will be I with initial HEP in order to progress with therapy. Baseline: HEP provided at eval Goal status: INITIAL   2.  PT will review FOTO with patient by 3rd visit in order to understand expected progress and outcome with therapy. Baseline: FOTO assessed at eval Goal status: INITIAL   3.  Patient will report left shoulder pain </= 2/10 in order to reduce functional limitations Baseline: 5/10 pain level Goal status: INITIAL   LONG TERM GOALS: Target date: 08/12/2022   Patient will be I with final HEP to maintain progress from PT. Baseline: HEP provided at eval Goal status: INITIAL   2.  Patient will report >/= 73% status on FOTO to indicate improved functional ability. Baseline: 65% functional status Goal status: INITIAL   3.  Patient will demonstrate left shoulder active elevation >/= 160 deg to improve overhead reach and dressing ability Baseline: 140 deg Goal status: INITIAL   4.  Patient exhibit left shoulder strength 5/5 MMT in order to reduce pain with repetitive shoulder activity and improve  lifting ability Baseline: patient demonstrates left shoulder strength deficits (see above) Goal status: INITIAL     PLAN: PT FREQUENCY: 1x/week   PT DURATION: 8 weeks   PLANNED INTERVENTIONS: Therapeutic exercises, Therapeutic activity, Neuromuscular re-education, Balance training, Gait training, Patient/Family education, Self Care, Joint mobilization, Joint manipulation, Aquatic Therapy, Dry Needling, Electrical stimulation, Spinal manipulation, Spinal mobilization, Cryotherapy, Moist heat, Taping, Ionotophoresis 4mg /ml Dexamethasone, Manual therapy, and Re-evaluation   PLAN FOR NEXT SESSION: Review HEP and progress PRN, manual and stretching for left shoulder to improve motion, rotator cuff and parascapular muscle strengthening as tolerated   Rosana Hoes, PT, DPT, LAT, ATC 06/25/22  9:14 AM Phone: [phone] Fax: [phone]

## 2022-06-29 DIAGNOSIS — J301 Allergic rhinitis due to pollen: Secondary | ICD-10-CM | POA: Diagnosis not present

## 2022-06-29 DIAGNOSIS — J3081 Allergic rhinitis due to animal (cat) (dog) hair and dander: Secondary | ICD-10-CM | POA: Diagnosis not present

## 2022-06-29 DIAGNOSIS — J3089 Other allergic rhinitis: Secondary | ICD-10-CM | POA: Diagnosis not present

## 2022-07-01 ENCOUNTER — Encounter: Payer: Self-pay | Admitting: Physical Therapy

## 2022-07-01 ENCOUNTER — Other Ambulatory Visit: Payer: Self-pay

## 2022-07-01 ENCOUNTER — Ambulatory Visit: Payer: Federal, State, Local not specified - PPO | Attending: Sports Medicine | Admitting: Physical Therapy

## 2022-07-01 DIAGNOSIS — M6281 Muscle weakness (generalized): Secondary | ICD-10-CM | POA: Diagnosis not present

## 2022-07-01 DIAGNOSIS — G8929 Other chronic pain: Secondary | ICD-10-CM | POA: Diagnosis not present

## 2022-07-01 DIAGNOSIS — M25512 Pain in left shoulder: Secondary | ICD-10-CM | POA: Insufficient documentation

## 2022-07-01 NOTE — Patient Instructions (Signed)
Access Code: [email or web address] URL: [email or web address] Date: 07/01/2022 Prepared by: Rosana Hoes  Exercises - Standing Single Arm Row with Resistance Thumb Up  - 1 x daily - 2-3 sets - 20 reps - Shoulder External Rotation with Anchored Resistance  - 1 x daily - 2-3 sets - 20 reps - Standing shoulder flexion wall slides  - 1-2 x daily - 2 sets - 10 reps - 5 seconds hold

## 2022-07-01 NOTE — Therapy (Signed)
OUTPATIENT PHYSICAL THERAPY TREATMENT NOTE   Patient Name: Catherine Campbell MRN: [id number] DOB:[birth year], 56 y.o., female Today's Date: 07/01/2022  PCP: [name], Limmie Patricia, MD   REFERRING PROVIDER: Richardean Sale, DO   END OF SESSION:   PT End of Session - 07/01/22 1711     Visit Number 2    Number of Visits 9    Date for PT Re-Evaluation 08/12/22    Authorization Type BCBS    PT Start Time 1700    PT Stop Time 1740    PT Time Calculation (min) 40 min    Activity Tolerance Patient tolerated treatment well    Behavior During Therapy [facility] for tasks assessed/performed             Past Medical History:  Diagnosis Date   Allergy    ASCUS with positive high risk human papillomavirus of vagina 06/2013, 12/2016   Colposcopic biopsy koilocytotic atypia.   Asthma    Hypertension    Low grade squamous intraepithelial lesion (LGSIL) 06/2014, 06/2015, 06/2016, 06/2017, 12/2017, 07/2018   positive high-risk HPV. Colposcopic biopsy LGSIL negative ECC 2015,  ECC 2017 with koilocytotic atypia changes   Past Surgical History:  Procedure Laterality Date   BOIL UNDER ARM     Patient Active Problem List   Diagnosis Date Noted   Morbid obesity (HCC) 02/06/2020   Vitamin D deficiency 04/04/2019   Vitamin B12 deficiency 04/04/2019   Seasonal allergies 03/31/2018   Asthma, chronic 08/30/2013   RECTAL BLEEDING 07/30/2010   Hyperlipidemia 12/06/2009   Allergic rhinitis 06/03/2009   VAGINITIS 01/09/2008   Acute upper respiratory infection 09/12/2007   Essential hypertension 05/24/2007    REFERRING DIAG: Acute pain of left shoulder, Tendinopathy of left rotator cuff   THERAPY DIAG:  Chronic left shoulder pain  Muscle weakness (generalized)  Rationale for Evaluation and Treatment Rehabilitation  PERTINENT HISTORY: None   PRECAUTIONS: None    SUBJECTIVE:        SUBJECTIVE STATEMENT: Patient reports improvement in her symptoms. She notes that before she was having  increased pain every 2-3 hours, now the pain is better and occurs less often.   PAIN:  Are you having pain? Yes:  NPRS scale: 1/10 Pain location: Left shoulder, radiating to lateral upper arm down to left hand Pain description: Dull at rest, burning with movement Aggravating factors: Repetitive movement of the left shoulder Relieving factors: Medication, rest  PATIENT GOALS: Wants the pain to stop    OBJECTIVE: (objective measures completed at initial evaluation unless otherwise dated) PATIENT SURVEYS:  FOTO 65% functional status   POSTURE: Mild rounded shoulder and forward head posture   UPPER EXTREMITY ROM:                          Cervical ROM non painful   Active ROM Right eval Left eval Left 07/01/2022  Shoulder flexion 160 140 148  Shoulder internal rotation T8 L1   Shoulder external rotation T2 C7     UPPER EXTREMITY MMT:   MMT Right eval Left eval  Shoulder flexion 5 4  Shoulder extension 5 4+  Shoulder abduction 5 4  Shoulder internal rotation 5 5  Shoulder external rotation 5 4  Middle trapezius - -  Lower trapezius - -  Elbow flexion 5 5  Elbow extension 5 5  Grip strength (lbs) 45 43  (Blank rows = not tested)   SHOULDER SPECIAL TESTS: Impingement testing positive Radicular testing negative  JOINT MOBILITY TESTING:  Left GHJ hypomobility and guarding    PALPATION:  Tenderness periacromial region and posterior cuff               TODAY'S TREATMENT: [facility] Adult PT Treatment:                                                DATE: 07/01/2022 Therapeutic Exercise: Recumbent bike L3 x 4 min (2 fwd/bwd) Supine dowel shoulder flexion x 20 Supine horizontal abduction with red 2 x 20 Row with green 2 x 20 Extension with red 2 x 20 ER / IR with green 2 x 20 Sanding wall walk with physioball x 10 Scaption with 3# 2 x 10 Manual: PROM left shoulder all directions   [facility] Adult PT Treatment:                                                DATE:  06/17/2022 Therapeutic Exercise: Single arm row with red 2 x 20 ER with red 2 x 20   PATIENT EDUCATION: Education details: HEP Person educated: Patient Education method: Programmer, multimedia, Demonstration, Tactile cues, Verbal cues, and Handouts Education comprehension: verbalized understanding, returned demonstration, verbal cues required, tactile cues required, and needs further education   HOME EXERCISE PROGRAM: Access Code: [email or web address]     ASSESSMENT: CLINICAL IMPRESSION: Patient tolerated therapy well with no adverse effects. Therapy focused on progressing left shoulder motion and strength with good tolerance. She reports improvement in her shoulder symptoms and was able to tolerate greater levels of resistance with strengthening. She demonstrates improvement in her shoulder motion but still limited compared to opposite side. Updated HEP to progress shoulder stretching. Patient would benefit from continued skilled PT to progress her mobility and strength in order to reduce pain and maximize functional ability.    OBJECTIVE IMPAIRMENTS: decreased activity tolerance, decreased ROM, decreased strength, postural dysfunction, and pain.    ACTIVITY LIMITATIONS: carrying, lifting, sleeping, bathing, dressing, reach over head, and hygiene/grooming   PARTICIPATION LIMITATIONS: meal prep, cleaning, laundry, shopping, and occupation   PERSONAL FACTORS: Fitness, Past/current experiences, and Time since onset of injury/illness/exacerbation are also affecting patient's functional outcome.      GOALS: Goals reviewed with patient? Yes   SHORT TERM GOALS: Target date: 07/15/2022   Patient will be I with initial HEP in order to progress with therapy. Baseline: HEP provided at eval Goal status: INITIAL   2.  PT will review FOTO with patient by 3rd visit in order to understand expected progress and outcome with therapy. Baseline: FOTO assessed at eval Goal status: INITIAL   3.  Patient will report  left shoulder pain </= 2/10 in order to reduce functional limitations Baseline: 5/10 pain level Goal status: INITIAL   LONG TERM GOALS: Target date: 08/12/2022   Patient will be I with final HEP to maintain progress from PT. Baseline: HEP provided at eval Goal status: INITIAL   2.  Patient will report >/= 73% status on FOTO to indicate improved functional ability. Baseline: 65% functional status Goal status: INITIAL   3.  Patient will demonstrate left shoulder active elevation >/= 160 deg to improve overhead reach and dressing ability Baseline: 140 deg Goal status: INITIAL   4.  Patient exhibit left shoulder strength 5/5 MMT in order to reduce pain with repetitive shoulder activity and improve lifting ability Baseline: patient demonstrates left shoulder strength deficits (see above) Goal status: INITIAL     PLAN: PT FREQUENCY: 1x/week   PT DURATION: 8 weeks   PLANNED INTERVENTIONS: Therapeutic exercises, Therapeutic activity, Neuromuscular re-education, Balance training, Gait training, Patient/Family education, Self Care, Joint mobilization, Joint manipulation, Aquatic Therapy, Dry Needling, Electrical stimulation, Spinal manipulation, Spinal mobilization, Cryotherapy, Moist heat, Taping, Ionotophoresis 4mg /ml Dexamethasone, Manual therapy, and Re-evaluation   PLAN FOR NEXT SESSION: Review HEP and progress PRN, manual and stretching for left shoulder to improve motion, rotator cuff and parascapular muscle strengthening as tolerated   Rosana Hoes, PT, DPT, LAT, ATC 07/01/22  5:45 PM Phone: [phone] Fax: [phone]

## 2022-07-02 NOTE — Progress Notes (Signed)
[facility] [address] [place] [address] Phone: [phone]   Assessment and Plan:     1. Acute pain of left shoulder 2. Tendinopathy of left rotator cuff  -Subacute, improving, subsequent visit - Overall improvement in symptoms consistent with rotator cuff tendinopathy that improved with subacromial CSI on 05/14/2022, course of meloxicam, HEP, and physical therapy - Discontinue daily meloxicam and using remainder as needed - Recommend using Tylenol for day-to-day pain relief - Recommend continuing HEP and completing physical therapy  Pertinent previous records reviewed include PT note 07/01/2022, PT note 06/17/2022, GFR 11/12/21   Follow Up: As needed if no improvement or worsening.  Could obtain x-ray of shoulder and discuss repeat CSI    Subjective:   I, Moenique Parris, am serving as a Neurosurgeon for Doctor Richardean Sale   Chief Complaint: left shoulder and arm pain    HPI:    05/14/22 Patient is a 56 year old female complaining of left shoulder and arm pain. She Patient states she has been in pain since labor day, she hasnt been able to get good sleep due to pain, sleeps with a pillow under her arm and still painful, no MOI, no numbness or tingling, been taking tylenol and naproxen and tizanidine and those dont really help just make her sleepy, posterior shoulder pain that radiates to the front and down the arm, she is left hand dominate , decreased ROM    06/05/2022 Patient states that she is good , shoulder still giving her a little problem  when she uses her arm her thumb will get tingly    07/03/2022 Patient states that she is good    Relevant Historical Information: Hypertension  Additional pertinent review of systems negative.   Current Outpatient Medications:    albuterol (VENTOLIN HFA) 108 (90 Base) MCG/ACT inhaler, , Disp: , Rfl:    atorvastatin (LIPITOR) 20 MG tablet, TAKE 1 TABLET BY MOUTH EVERY DAY,  Disp: 90 tablet, Rfl: 1   azelastine (ASTELIN) 0.1 % nasal spray, INSTILL 1 PUFF IN EACH NOSTRIL TWICE A DAY NASALLY 30 DAYS, Disp: , Rfl: 3   cetirizine (ZYRTEC) 10 MG tablet, Take 10 mg by mouth daily., Disp: , Rfl:    clobetasol cream (TEMOVATE) 0.05 %, Apply at bedtime as needed, Disp: 60 g, Rfl: 1   EPIPEN 2-PAK 0.3 MG/0.3ML SOAJ injection, Reported on 09/24/2015, Disp: , Rfl: 0   fexofenadine (ALLEGRA) 180 MG tablet, Take 180 mg by mouth daily., Disp: , Rfl:    fluticasone (FLONASE) 50 MCG/ACT nasal spray, INSTILL 2 SPRAYS IN EACH NOSTRIL ONCE A DAY NASALLY 30 DAYS, Disp: , Rfl: 3   losartan (COZAAR) 50 MG tablet, TAKE 2 TABLETS BY MOUTH EVERY DAY, Disp: 180 tablet, Rfl: 1   meloxicam (MOBIC) 15 MG tablet, Take 1 tablet (15 mg total) by mouth daily., Disp: 30 tablet, Rfl: 0   montelukast (SINGULAIR) 10 MG tablet, Take 10 mg by mouth at bedtime. , Disp: , Rfl:    naproxen (NAPROSYN) 500 MG tablet, Take 1 tablet (500 mg total) by mouth 2 (two) times daily., Disp: 20 tablet, Rfl: 0   RESTASIS 0.05 % ophthalmic emulsion, INSTILL 1 DROP INTO BOTH EYES TWICE A DAY, Disp: , Rfl: 3   SYMBICORT 80-4.5 MCG/ACT inhaler, , Disp: , Rfl:    tiZANidine (ZANAFLEX) 4 MG tablet, Take 1 tablet (4 mg total) by mouth every 8 (eight) hours as needed for muscle spasms., Disp: 30 tablet,  Rfl: 0   TYRVAYA 0.03 MG/ACT SOLN, Used for dry eye, Disp: , Rfl:    UNABLE TO FIND, Allergy shots once a week, Disp: , Rfl:   Current Facility-Administered Medications:    0.9 %  sodium chloride infusion, 500 mL, Intravenous, Once, Danis, Starr Lake III, MD   Objective:     Vitals:   07/03/22 1301  BP: 132/78  Pulse: 84  SpO2: 100%  Weight: 252 lb (114.3 kg)  Height: 5\' 5"  (1.651 m)      Body mass index is 41.93 kg/m.    Physical Exam:    Gen: Appears well, nad, nontoxic and pleasant Neuro:sensation intact, strength is 5/5 with df/pf/inv/ev, muscle tone wnl Skin: no suspicious lesion or defmority Psych: A&O,  appropriate mood and affect   Left shoulder: no deformity, swelling or muscle wasting No scapular winging FF 180, abd 180, int 0, ext 90 NTTP over the [place], clavicle, ac, coracoid, biceps groove, humerus, deltoid, trapezius, cervical spine Negative Hawking's, empty can, O'Brien, crossarm Neg neer,   subscap liftoff, speeds,   Neg ant drawer, sulcus sign, apprehension Negative Spurling's test bilat FROM of neck       Electronically signed by:  D[facility] 1:09 PM 07/03/22

## 2022-07-03 ENCOUNTER — Ambulatory Visit: Payer: Federal, State, Local not specified - PPO | Admitting: Sports Medicine

## 2022-07-03 VITALS — BP 132/78 | HR 84 | Ht 65.0 in | Wt 252.0 lb

## 2022-07-03 DIAGNOSIS — M67912 Unspecified disorder of synovium and tendon, left shoulder: Secondary | ICD-10-CM | POA: Diagnosis not present

## 2022-07-03 DIAGNOSIS — M25512 Pain in left shoulder: Secondary | ICD-10-CM | POA: Diagnosis not present

## 2022-07-03 NOTE — Patient Instructions (Addendum)
Good to see you  Continue HEP and PT Stop daily meloxicam and use remainder as needed  As needed follow up

## 2022-07-06 DIAGNOSIS — J301 Allergic rhinitis due to pollen: Secondary | ICD-10-CM | POA: Diagnosis not present

## 2022-07-06 DIAGNOSIS — J3089 Other allergic rhinitis: Secondary | ICD-10-CM | POA: Diagnosis not present

## 2022-07-06 DIAGNOSIS — J3081 Allergic rhinitis due to animal (cat) (dog) hair and dander: Secondary | ICD-10-CM | POA: Diagnosis not present

## 2022-07-08 ENCOUNTER — Ambulatory Visit: Payer: Federal, State, Local not specified - PPO | Admitting: Physical Therapy

## 2022-07-08 ENCOUNTER — Encounter: Payer: Self-pay | Admitting: Physical Therapy

## 2022-07-08 ENCOUNTER — Other Ambulatory Visit: Payer: Self-pay

## 2022-07-08 DIAGNOSIS — G8929 Other chronic pain: Secondary | ICD-10-CM | POA: Diagnosis not present

## 2022-07-08 DIAGNOSIS — M6281 Muscle weakness (generalized): Secondary | ICD-10-CM

## 2022-07-08 DIAGNOSIS — M25512 Pain in left shoulder: Secondary | ICD-10-CM | POA: Diagnosis not present

## 2022-07-08 NOTE — Therapy (Signed)
OUTPATIENT PHYSICAL THERAPY TREATMENT NOTE   Patient Name: Catherine Campbell MRN: [id number] DOB:08-May-1966, 56 y.o., female Today's Date: 07/08/2022  PCP: [name], Limmie Patricia, MD   REFERRING PROVIDER: Richardean Sale, DO   END OF SESSION:   PT End of Session - 07/08/22 1706     Visit Number 3    Number of Visits 9    Date for PT Re-Evaluation 08/12/22    Authorization Type BCBS    PT Start Time 1700    PT Stop Time 1740    PT Time Calculation (min) 40 min    Activity Tolerance Patient tolerated treatment well    Behavior During Therapy [facility] for tasks assessed/performed              Past Medical History:  Diagnosis Date   Allergy    ASCUS with positive high risk human papillomavirus of vagina 06/2013, 12/2016   Colposcopic biopsy koilocytotic atypia.   Asthma    Hypertension    Low grade squamous intraepithelial lesion (LGSIL) 06/2014, 06/2015, 06/2016, 06/2017, 12/2017, 07/2018   positive high-risk HPV. Colposcopic biopsy LGSIL negative ECC 2015,  ECC 2017 with koilocytotic atypia changes   Past Surgical History:  Procedure Laterality Date   BOIL UNDER ARM     Patient Active Problem List   Diagnosis Date Noted   Morbid obesity (HCC) 02/06/2020   Vitamin D deficiency 04/04/2019   Vitamin B12 deficiency 04/04/2019   Seasonal allergies 03/31/2018   Asthma, chronic 08/30/2013   RECTAL BLEEDING 07/30/2010   Hyperlipidemia 12/06/2009   Allergic rhinitis 06/03/2009   VAGINITIS 01/09/2008   Acute upper respiratory infection 09/12/2007   Essential hypertension 05/24/2007    REFERRING DIAG: Acute pain of left shoulder, Tendinopathy of left rotator cuff   THERAPY DIAG:  Chronic left shoulder pain  Muscle weakness (generalized)  Rationale for Evaluation and Treatment Rehabilitation  PERTINENT HISTORY: None   PRECAUTIONS: None    SUBJECTIVE:        SUBJECTIVE STATEMENT: Patient reports she continues to improve.  PAIN:  Are you having pain? Yes:   NPRS scale: 0/10 Pain location: Left shoulder, radiating to lateral upper arm down to left hand Pain description: Dull at rest, burning with movement Aggravating factors: Repetitive movement of the left shoulder Relieving factors: Medication, rest  PATIENT GOALS: Wants the pain to stop    OBJECTIVE: (objective measures completed at initial evaluation unless otherwise dated) PATIENT SURVEYS:  FOTO 65% functional status   POSTURE: Mild rounded shoulder and forward head posture   UPPER EXTREMITY ROM:                          Cervical ROM non painful   Active ROM Right eval Left eval Left 07/01/2022  Shoulder flexion 160 140 148  Shoulder internal rotation T8 L1   Shoulder external rotation T2 C7     UPPER EXTREMITY MMT:   MMT Right eval Left eval Left 07/08/22  Shoulder flexion 5 4 4+  Shoulder extension 5 4+   Shoulder abduction 5 4 4+  Shoulder internal rotation 5 5   Shoulder external rotation 5 4   Middle trapezius - -   Lower trapezius - -   Elbow flexion 5 5   Elbow extension 5 5   Grip strength (lbs) 45 43   (Blank rows = not tested)   SHOULDER SPECIAL TESTS: Impingement testing positive Radicular testing negative   JOINT MOBILITY TESTING:  Left GHJ hypomobility  and guarding    PALPATION:  Tenderness periacromial region and posterior cuff               TODAY'S TREATMENT: [facility] Adult PT Treatment:                                                DATE: 07/08/2022 Therapeutic Exercise: Recumbent bike L3 x 4 min (2 fwd/bwd) while taking subjective Row with blue 2 x 20 Extension with green 2 x 20 ER / IR with green 2 x 20 each Sanding wall walk with physioball x 10 Scaption with 2# 2 x 10 Seated horizontal abduction with red 2 x 20 Standing wall ball circles cw/ccw 2 x 10 each with red ball   [facility] Adult PT Treatment:                                                DATE: 07/01/2022 Therapeutic Exercise: Recumbent bike L3 x 4 min (2 fwd/bwd) Supine  dowel shoulder flexion x 20 Supine horizontal abduction with red 2 x 20 Row with green 2 x 20 Extension with red 2 x 20 ER / IR with green 2 x 20 Sanding wall walk with physioball x 10 Scaption with 3# 2 x 10 Manual: PROM left shoulder all directions   PATIENT EDUCATION: Education details: HEP Person educated: Patient Education method: Explanation, Demonstration, Tactile cues, Verbal cues Education comprehension: verbalized understanding, returned demonstration, verbal cues required, tactile cues required, and needs further education   HOME EXERCISE PROGRAM: Access Code: [email or web address]     ASSESSMENT: CLINICAL IMPRESSION: Patient tolerated therapy well with no adverse effects. Therapy focused on progressing shoulder strength and endurance with good tolerance. She notes continued improvement in her shoulder pain, but does continue to to exhibit strength deficits of the left shoulder and reports fatigue at end of therapy. She does require occasional cueing for posture and exercise technique. No changes to HEP this visit. Patient would benefit from continued skilled PT to progress her mobility and strength in order to reduce pain and maximize functional ability.   OBJECTIVE IMPAIRMENTS: decreased activity tolerance, decreased ROM, decreased strength, postural dysfunction, and pain.    ACTIVITY LIMITATIONS: carrying, lifting, sleeping, bathing, dressing, reach over head, and hygiene/grooming   PARTICIPATION LIMITATIONS: meal prep, cleaning, laundry, shopping, and occupation   PERSONAL FACTORS: Fitness, Past/current experiences, and Time since onset of injury/illness/exacerbation are also affecting patient's functional outcome.      GOALS: Goals reviewed with patient? Yes   SHORT TERM GOALS: Target date: 07/15/2022   Patient will be I with initial HEP in order to progress with therapy. Baseline: HEP provided at eval Goal status: INITIAL   2.  PT will review FOTO with patient by 3rd  visit in order to understand expected progress and outcome with therapy. Baseline: FOTO assessed at eval Goal status: INITIAL   3.  Patient will report left shoulder pain </= 2/10 in order to reduce functional limitations Baseline: 5/10 pain level Goal status: INITIAL   LONG TERM GOALS: Target date: 08/12/2022   Patient will be I with final HEP to maintain progress from PT. Baseline: HEP provided at eval Goal status: INITIAL   2.  Patient will report >/= 73% status  on FOTO to indicate improved functional ability. Baseline: 65% functional status Goal status: INITIAL   3.  Patient will demonstrate left shoulder active elevation >/= 160 deg to improve overhead reach and dressing ability Baseline: 140 deg Goal status: INITIAL   4.  Patient exhibit left shoulder strength 5/5 MMT in order to reduce pain with repetitive shoulder activity and improve lifting ability Baseline: patient demonstrates left shoulder strength deficits (see above) Goal status: INITIAL     PLAN: PT FREQUENCY: 1x/week   PT DURATION: 8 weeks   PLANNED INTERVENTIONS: Therapeutic exercises, Therapeutic activity, Neuromuscular re-education, Balance training, Gait training, Patient/Family education, Self Care, Joint mobilization, Joint manipulation, Aquatic Therapy, Dry Needling, Electrical stimulation, Spinal manipulation, Spinal mobilization, Cryotherapy, Moist heat, Taping, Ionotophoresis 4mg /ml Dexamethasone, Manual therapy, and Re-evaluation   PLAN FOR NEXT SESSION: Review HEP and progress PRN, manual and stretching for left shoulder to improve motion, rotator cuff and parascapular muscle strengthening as tolerated   Rosana Hoes, PT, DPT, LAT, ATC 07/08/22  5:43 PM Phone: [phone] Fax: [phone]

## 2022-07-13 DIAGNOSIS — J301 Allergic rhinitis due to pollen: Secondary | ICD-10-CM | POA: Diagnosis not present

## 2022-07-13 DIAGNOSIS — J3089 Other allergic rhinitis: Secondary | ICD-10-CM | POA: Diagnosis not present

## 2022-07-13 NOTE — Therapy (Signed)
OUTPATIENT PHYSICAL THERAPY TREATMENT NOTE  DISCHARGE   Patient Name: Catherine Campbell MRN: [id number] DOB:25-Apr-1966, 56 y.o., female Today's Date: 07/15/2022  PCP: [name], Limmie Patricia, MD   REFERRING PROVIDER: Richardean Sale, DO   END OF SESSION:   PT End of Session - 07/15/22 1707     Visit Number 4    Number of Visits 9    Date for PT Re-Evaluation 08/12/22    Authorization Type BCBS    PT Start Time 1700    PT Stop Time 1730    PT Time Calculation (min) 30 min    Activity Tolerance Patient tolerated treatment well    Behavior During Therapy [facility] for tasks assessed/performed               Past Medical History:  Diagnosis Date   Allergy    ASCUS with positive high risk human papillomavirus of vagina 06/2013, 12/2016   Colposcopic biopsy koilocytotic atypia.   Asthma    Hypertension    Low grade squamous intraepithelial lesion (LGSIL) 06/2014, 06/2015, 06/2016, 06/2017, 12/2017, 07/2018   positive high-risk HPV. Colposcopic biopsy LGSIL negative ECC 2015,  ECC 2017 with koilocytotic atypia changes   Past Surgical History:  Procedure Laterality Date   BOIL UNDER ARM     Patient Active Problem List   Diagnosis Date Noted   Morbid obesity (HCC) 02/06/2020   Vitamin D deficiency 04/04/2019   Vitamin B12 deficiency 04/04/2019   Seasonal allergies 03/31/2018   Asthma, chronic 08/30/2013   RECTAL BLEEDING 07/30/2010   Hyperlipidemia 12/06/2009   Allergic rhinitis 06/03/2009   VAGINITIS 01/09/2008   Acute upper respiratory infection 09/12/2007   Essential hypertension 05/24/2007    REFERRING DIAG: Acute pain of left shoulder, Tendinopathy of left rotator cuff   THERAPY DIAG:  Chronic left shoulder pain  Muscle weakness (generalized)  Rationale for Evaluation and Treatment Rehabilitation  PERTINENT HISTORY: None   PRECAUTIONS: None    SUBJECTIVE:        SUBJECTIVE STATEMENT: Patient reports she is doing great. Denies any pain and feels  like she is back to normal without limitations.  PAIN:  Are you having pain? No  PATIENT GOALS: Wants the pain to stop    OBJECTIVE: (objective measures completed at initial evaluation unless otherwise dated) PATIENT SURVEYS:  FOTO 65% functional status  07/15/2022: 101%   POSTURE: Mild rounded shoulder and forward head posture   UPPER EXTREMITY ROM:                          Cervical ROM non painful   Active ROM Right eval Left eval Left 07/01/2022 Rt / Lt 07/15/2022  Shoulder flexion 160 140 148 165 / 163  Shoulder internal rotation T8 L1  T8 / T10  Shoulder external rotation T2 C7  T2 / T1    UPPER EXTREMITY MMT:   MMT Right eval Left eval Left 07/08/22 Left 07/15/22  Shoulder flexion 5 4 4+ 5  Shoulder extension 5 4+    Shoulder abduction 5 4 4+ 5  Shoulder internal rotation 5 5    Shoulder external rotation 5 4  5   Middle trapezius - -    Lower trapezius - -    Elbow flexion 5 5    Elbow extension 5 5    Grip strength (lbs) 45 43    (Blank rows = not tested)   SHOULDER SPECIAL TESTS: Impingement testing positive Radicular testing negative  JOINT MOBILITY TESTING:  Left GHJ hypomobility and guarding    PALPATION:  Tenderness periacromial region and posterior cuff               TODAY'S TREATMENT: [facility] Adult PT Treatment:                                                DATE: 07/15/2022 Therapeutic Exercise: Recumbent bike L3 x 4 min (2 fwd/bwd) while taking subjective Row with blue 2 x 20 Extension with green 2 x 20 ER / IR with green x 20 each Standing wall ball circles cw/ccw 2 x 20 each with red ball Scaption with 2# 2 x 10 Seated horizontal abduction with red 2 x 20   [facility] Adult PT Treatment:                                                DATE: 07/08/2022 Therapeutic Exercise: Recumbent bike L3 x 4 min (2 fwd/bwd) while taking subjective Row with blue 2 x 20 Extension with green 2 x 20 ER / IR with green 2 x 20 each Sanding wall walk  with physioball x 10 Scaption with 2# 2 x 10 Seated horizontal abduction with red 2 x 20 Standing wall ball circles cw/ccw 2 x 10 each with red ball  [facility] Adult PT Treatment:                                                DATE: 07/01/2022 Therapeutic Exercise: Recumbent bike L3 x 4 min (2 fwd/bwd) Supine dowel shoulder flexion x 20 Supine horizontal abduction with red 2 x 20 Row with green 2 x 20 Extension with red 2 x 20 ER / IR with green 2 x 20 Sanding wall walk with physioball x 10 Scaption with 3# 2 x 10 Manual: PROM left shoulder all directions   PATIENT EDUCATION: Education details: POC discharge, HEP Person educated: Patient Education method: Programmer, multimedia, Demonstration Education comprehension: verbalized understanding, returned demonstration, verbal cues required   HOME EXERCISE PROGRAM: Access Code: [email or web address]     ASSESSMENT: CLINICAL IMPRESSION: Patient tolerated therapy well with no adverse effects. She has achieved all established goals, demonstrating improved shoulder motion, strength, decreased pain, and improved functional ability indicated with FOTO score. She is independent with her HEP so will transition to home exercise program and will be formally discharged from PT.   OBJECTIVE IMPAIRMENTS: decreased activity tolerance, decreased ROM, decreased strength, postural dysfunction, and pain.    ACTIVITY LIMITATIONS: carrying, lifting, sleeping, bathing, dressing, reach over head, and hygiene/grooming   PARTICIPATION LIMITATIONS: meal prep, cleaning, laundry, shopping, and occupation   PERSONAL FACTORS: Fitness, Past/current experiences, and Time since onset of injury/illness/exacerbation are also affecting patient's functional outcome.      GOALS: Goals reviewed with patient? Yes   SHORT TERM GOALS: Target date: 07/15/2022   Patient will be I with initial HEP in order to progress with therapy. Baseline: HEP provided at eval 07/15/2022: independent Goal  status: MET   2.  PT will review FOTO with patient by 3rd visit in order to understand expected  progress and outcome with therapy. Baseline: FOTO assessed at eval 07/15/2022: reviewed Goal status: MET   3.  Patient will report left shoulder pain </= 2/10 in order to reduce functional limitations Baseline: 5/10 pain level 07/15/2022: denies pain Goal status: MET   LONG TERM GOALS: Target date: 08/12/2022   Patient will be I with final HEP to maintain progress from PT. Baseline: HEP provided at eval 07/15/2022: independent Goal status: MET   2.  Patient will report >/= 73% status on FOTO to indicate improved functional ability. Baseline: 65% functional status 07/15/2022: 101% Goal status: MET   3.  Patient will demonstrate left shoulder active elevation >/= 160 deg to improve overhead reach and dressing ability Baseline: 140 deg 07/15/2022:163 deg Goal status: MET   4.  Patient exhibit left shoulder strength 5/5 MMT in order to reduce pain with repetitive shoulder activity and improve lifting ability Baseline: patient demonstrates left shoulder strength deficits (see above) 07/15/2022: grossly 5/5 MMT Goal status: MET     PLAN: PT FREQUENCY: 1x/week   PT DURATION: 8 weeks   PLANNED INTERVENTIONS: Therapeutic exercises, Therapeutic activity, Neuromuscular re-education, Balance training, Gait training, Patient/Family education, Self Care, Joint mobilization, Joint manipulation, Aquatic Therapy, Dry Needling, Electrical stimulation, Spinal manipulation, Spinal mobilization, Cryotherapy, Moist heat, Taping, Ionotophoresis 4mg /ml Dexamethasone, Manual therapy, and Re-evaluation   PLAN FOR NEXT SESSION: NA - discharge   Rosana Hoes, PT, DPT, LAT, ATC 07/15/22  5:32 PM Phone: [phone] Fax: [phone]   PHYSICAL THERAPY DISCHARGE SUMMARY  Visits from Start of Care: 4  Current functional level related to goals / functional outcomes: See above   Remaining  deficits: See above   Education / Equipment: HEP   Patient agrees to discharge. Patient goals were met. Patient is being discharged due to meeting the stated rehab goals.

## 2022-07-15 ENCOUNTER — Encounter: Payer: Self-pay | Admitting: Physical Therapy

## 2022-07-15 ENCOUNTER — Ambulatory Visit: Payer: Federal, State, Local not specified - PPO | Admitting: Physical Therapy

## 2022-07-15 ENCOUNTER — Other Ambulatory Visit: Payer: Self-pay

## 2022-07-15 DIAGNOSIS — M6281 Muscle weakness (generalized): Secondary | ICD-10-CM

## 2022-07-15 DIAGNOSIS — G8929 Other chronic pain: Secondary | ICD-10-CM | POA: Diagnosis not present

## 2022-07-15 DIAGNOSIS — M25512 Pain in left shoulder: Secondary | ICD-10-CM | POA: Diagnosis not present

## 2022-07-15 NOTE — Patient Instructions (Signed)
Access Code: [email or web address] URL: [email or web address] Date: 07/15/2022 Prepared by: Rosana Hoes  Exercises - Standing Single Arm Row with Resistance Thumb Up  - 1 x daily - 2-3 sets - 20 reps - Shoulder External Rotation with Anchored Resistance  - 1 x daily - 2-3 sets - 20 reps - Standing shoulder flexion wall slides  - 1-2 x daily - 2 sets - 10 reps - 5 seconds hold

## 2022-07-20 DIAGNOSIS — J301 Allergic rhinitis due to pollen: Secondary | ICD-10-CM | POA: Diagnosis not present

## 2022-07-20 DIAGNOSIS — J3081 Allergic rhinitis due to animal (cat) (dog) hair and dander: Secondary | ICD-10-CM | POA: Diagnosis not present

## 2022-07-20 DIAGNOSIS — J3089 Other allergic rhinitis: Secondary | ICD-10-CM | POA: Diagnosis not present

## 2022-07-24 ENCOUNTER — Other Ambulatory Visit: Payer: Self-pay | Admitting: Internal Medicine

## 2022-07-24 DIAGNOSIS — I10 Essential (primary) hypertension: Secondary | ICD-10-CM

## 2022-07-27 DIAGNOSIS — J3089 Other allergic rhinitis: Secondary | ICD-10-CM | POA: Diagnosis not present

## 2022-07-27 DIAGNOSIS — J3081 Allergic rhinitis due to animal (cat) (dog) hair and dander: Secondary | ICD-10-CM | POA: Diagnosis not present

## 2022-07-27 DIAGNOSIS — J301 Allergic rhinitis due to pollen: Secondary | ICD-10-CM | POA: Diagnosis not present

## 2022-08-04 DIAGNOSIS — J3089 Other allergic rhinitis: Secondary | ICD-10-CM | POA: Diagnosis not present

## 2022-08-04 DIAGNOSIS — J301 Allergic rhinitis due to pollen: Secondary | ICD-10-CM | POA: Diagnosis not present

## 2022-08-05 ENCOUNTER — Encounter: Payer: Self-pay | Admitting: Nurse Practitioner

## 2022-08-10 ENCOUNTER — Ambulatory Visit (INDEPENDENT_AMBULATORY_CARE_PROVIDER_SITE_OTHER): Payer: Federal, State, Local not specified - PPO | Admitting: Nurse Practitioner

## 2022-08-10 ENCOUNTER — Encounter: Payer: Self-pay | Admitting: Nurse Practitioner

## 2022-08-10 ENCOUNTER — Other Ambulatory Visit (HOSPITAL_COMMUNITY)
Admission: RE | Admit: 2022-08-10 | Discharge: 2022-08-10 | Disposition: A | Discharge status: Home / Self Care | Payer: Federal, State, Local not specified - PPO | Source: Ambulatory Visit | Attending: Nurse Practitioner | Admitting: Nurse Practitioner

## 2022-08-10 VITALS — BP 122/82 | Ht 64.0 in | Wt 250.2 lb

## 2022-08-10 DIAGNOSIS — B372 Candidiasis of skin and nail: Secondary | ICD-10-CM | POA: Diagnosis not present

## 2022-08-10 DIAGNOSIS — Z780 Asymptomatic menopausal state: Secondary | ICD-10-CM | POA: Diagnosis not present

## 2022-08-10 DIAGNOSIS — Z01419 Encounter for gynecological examination (general) (routine) without abnormal findings: Secondary | ICD-10-CM | POA: Insufficient documentation

## 2022-08-10 MED ORDER — NYSTATIN 100000 UNIT/GM EX POWD: 1.0000 | Freq: Two times a day (BID) | CUTANEOUS | 0 refills | Status: DC

## 2022-08-10 NOTE — Progress Notes (Signed)
Catherine Campbell 03-Dec-1965 [id number]   History:  56 y.o. G0 presents for annual exam. Complains of rash on inner thighs. Postmenopausal - no HRT, no bleeding.  History of persistent LGSIL 2015-2019, negative colposcopy and biopsy 2020, normal 2021, 07/2021 ASCUS neg HPV. HTN, HLD managed by PCP.   Gynecologic History Patient's last menstrual period was 04/05/2020.   Contraception/Family planning: abstinence Sexually active: No  Health Maintenance Last Pap: 08/06/2021. Results were: ASCUS neg HPV Last mammogram: 11/11/2021. Results were: Normal Last colonoscopy: 05/26/2018. Results were: Normal, 10-year recall Last Dexa: Not indicated  Past medical history, past surgical history, family history and social history were all reviewed and documented in the EPIC chart. Works for [employer].   ROS:  A ROS was performed and pertinent positives and negatives are included.  Exam:  Vitals:   08/10/22 1608  BP: 122/82  Weight: 250 lb 3.2 oz (113.5 kg)  Height: 5\' 4"  (1.626 m)    Body mass index is 42.95 kg/m.  General appearance:  Normal Thyroid:  Symmetrical, normal in size, without palpable masses or nodularity. Respiratory  Auscultation:  Clear without wheezing or rhonchi Cardiovascular  Auscultation:  Regular rate, without rubs, murmurs or gallops  Edema/varicosities:  Not grossly evident Abdominal  Soft,nontender, without masses, guarding or rebound.  Liver/spleen:  No organomegaly noted  Hernia:  None appreciated  Skin  Inspection:  Rash under breasts and in bilateral groin c/w yeast Breasts: Examined lying and sitting.   Right: Without masses, retractions, nipple discharge or axillary adenopathy.   Left: Without masses, retractions, nipple discharge or axillary adenopathy. Genitourinary   Inguinal/mons:  Normal without inguinal adenopathy  External genitalia:  Normal appearing vulva with no masses, tenderness, or lesions  BUS/Urethra/Skene's glands:  Normal  Vagina:   Normal appearing with normal color and discharge, no lesions  Cervix:  Normal appearing without discharge or lesions  Uterus:  Difficult to palpate due to body habitus  but no gross masses or tenderness  Adnexa/parametria:     Rt: Normal in size, without masses or tenderness.   Lt: Normal in size, without masses or tenderness.  Anus and perineum: Normal  Digital rectal exam: Normal sphincter tone without palpated masses or tenderness  Patient informed chaperone available to be present for breast and pelvic exam. Patient has requested no chaperone to be present. Patient has been advised what will be completed during breast and pelvic exam.   Assessment/Plan:  56 y.o. G0 for annual exam.   Well female exam with routine gynecological exam - Plan: Cytology - PAP( [place]). Education provided on SBEs, importance of preventative screenings, current guidelines, high calcium diet, regular exercise, and multivitamin daily.  Labs with PCP.  Postmenopausal - no HRT, no bleeding  Skin yeast infection - Plan: nystatin (MYCOSTATIN/NYSTOP) powder BID to groin and under breasts.   Screening for cervical cancer - History of persistent LGSIL 2015-2019, negative colposcopy and biopsy 2020, normal pap 2021, 07/2021 ASCUS neg HPV. Pap today per guidelines.   Screening for breast cancer - Normal mammogram history.  Continue annual screenings.  Normal breast exam today.  Screening for colon cancer - 2019 colonoscopy. Will repeat at GI's recommended interval.   Return in 1 year for annual.     Olivia Mackie DNP, 4:23 PM 08/10/2022

## 2022-08-11 DIAGNOSIS — J301 Allergic rhinitis due to pollen: Secondary | ICD-10-CM | POA: Diagnosis not present

## 2022-08-11 DIAGNOSIS — J3081 Allergic rhinitis due to animal (cat) (dog) hair and dander: Secondary | ICD-10-CM | POA: Diagnosis not present

## 2022-08-11 DIAGNOSIS — J3089 Other allergic rhinitis: Secondary | ICD-10-CM | POA: Diagnosis not present

## 2022-08-19 ENCOUNTER — Other Ambulatory Visit: Payer: Self-pay

## 2022-08-19 DIAGNOSIS — J3089 Other allergic rhinitis: Secondary | ICD-10-CM | POA: Diagnosis not present

## 2022-08-19 DIAGNOSIS — R87610 Atypical squamous cells of undetermined significance on cytologic smear of cervix (ASC-US): Secondary | ICD-10-CM

## 2022-08-19 DIAGNOSIS — J301 Allergic rhinitis due to pollen: Secondary | ICD-10-CM | POA: Diagnosis not present

## 2022-08-19 DIAGNOSIS — J3081 Allergic rhinitis due to animal (cat) (dog) hair and dander: Secondary | ICD-10-CM | POA: Diagnosis not present

## 2022-08-19 LAB — CYTOLOGY - PAP
Comment: NEGATIVE
Diagnosis: UNDETERMINED — AB
High risk HPV: POSITIVE — AB

## 2022-08-26 ENCOUNTER — Ambulatory Visit: Payer: Federal, State, Local not specified - PPO | Admitting: Internal Medicine

## 2022-08-26 ENCOUNTER — Encounter: Payer: Self-pay | Admitting: Internal Medicine

## 2022-08-26 VITALS — BP 130/84 | HR 100 | Temp 98.1°F | Wt 251.0 lb

## 2022-08-26 DIAGNOSIS — J3089 Other allergic rhinitis: Secondary | ICD-10-CM | POA: Diagnosis not present

## 2022-08-26 DIAGNOSIS — J301 Allergic rhinitis due to pollen: Secondary | ICD-10-CM | POA: Diagnosis not present

## 2022-08-26 DIAGNOSIS — E538 Deficiency of other specified B group vitamins: Secondary | ICD-10-CM

## 2022-08-26 DIAGNOSIS — G2581 Restless legs syndrome: Secondary | ICD-10-CM | POA: Diagnosis not present

## 2022-08-26 MED ORDER — CYANOCOBALAMIN 1000 MCG/ML IJ SOLN
1000.0000 ug | Freq: Once | INTRAMUSCULAR | Status: AC
  Administered 2022-08-26: 1000 ug via INTRAMUSCULAR

## 2022-08-26 NOTE — Progress Notes (Signed)
Established Patient Office Visit     CC/Reason for Visit: Tingly legs  HPI: Catherine Campbell is a 57 y.o. female who is coming in today for the above mentioned reasons. Past Medical History is significant for: Hyperlipidemia, hypertension, obesity, vitamin D and B12 deficiencies.  She has not had labs or seen me since last year.  She states that for the past 5 days she has been noticing tingling of bilateral legs and feet.  She has noted to have vitamin B12 deficiency and has not had any supplementation in close to a year.   Past Medical/Surgical History: Past Medical History:  Diagnosis Date   Allergy    ASCUS with positive high risk human papillomavirus of vagina 06/2013, 12/2016   Colposcopic biopsy koilocytotic atypia.   Asthma    Hypertension    Low grade squamous intraepithelial lesion (LGSIL) 06/2014, 06/2015, 06/2016, 06/2017, 12/2017, 07/2018   positive high-risk HPV. Colposcopic biopsy LGSIL negative ECC 2015,  ECC 2017 with koilocytotic atypia changes    Past Surgical History:  Procedure Laterality Date   BOIL UNDER ARM      Social History:  reports that she has never smoked. She has never used smokeless tobacco. She reports that she does not drink alcohol and does not use drugs.  Allergies: Allergies  Allergen Reactions   Doxycycline Hives   Metaxalone Hives   Penicillins Hives   Tramadol-Acetaminophen Hives    Family History:  Family History  Problem Relation Age of Onset   Diabetes Father    Hypertension Father    Colon polyps Father    Hypertension Mother    Cancer Mother        Pancreatic   Colon polyps Mother    Rectal cancer Neg Hx    Stomach cancer Neg Hx    Colon cancer Neg Hx      Current Outpatient Medications:    albuterol (VENTOLIN HFA) 108 (90 Base) MCG/ACT inhaler, , Disp: , Rfl:    atorvastatin (LIPITOR) 20 MG tablet, TAKE 1 TABLET BY MOUTH EVERY DAY, Disp: 90 tablet, Rfl: 1   azelastine (ASTELIN) 0.1 % nasal spray, INSTILL 1  PUFF IN EACH NOSTRIL TWICE A DAY NASALLY 30 DAYS, Disp: , Rfl: 3   cetirizine (ZYRTEC) 10 MG tablet, Take 10 mg by mouth daily., Disp: , Rfl:    clobetasol cream (TEMOVATE) 0.05 %, Apply at bedtime as needed, Disp: 60 g, Rfl: 1   EPIPEN 2-PAK 0.3 MG/0.3ML SOAJ injection, Reported on 09/24/2015, Disp: , Rfl: 0   fexofenadine (ALLEGRA) 180 MG tablet, Take 180 mg by mouth daily., Disp: , Rfl:    fluticasone (FLONASE) 50 MCG/ACT nasal spray, INSTILL 2 SPRAYS IN EACH NOSTRIL ONCE A DAY NASALLY 30 DAYS, Disp: , Rfl: 3   losartan (COZAAR) 50 MG tablet, TAKE 2 TABLETS BY MOUTH EVERY DAY, Disp: 180 tablet, Rfl: 1   meloxicam (MOBIC) 15 MG tablet, Take 1 tablet (15 mg total) by mouth daily., Disp: 30 tablet, Rfl: 0   montelukast (SINGULAIR) 10 MG tablet, Take 10 mg by mouth at bedtime. , Disp: , Rfl:    naproxen (NAPROSYN) 500 MG tablet, Take 1 tablet (500 mg total) by mouth 2 (two) times daily., Disp: 20 tablet, Rfl: 0   nystatin (MYCOSTATIN/NYSTOP) powder, Apply 1 Application topically 2 (two) times daily., Disp: 60 g, Rfl: 0   RESTASIS 0.05 % ophthalmic emulsion, INSTILL 1 DROP INTO BOTH EYES TWICE A DAY, Disp: , Rfl: 3   SYMBICORT  80-4.5 MCG/ACT inhaler, , Disp: , Rfl:    tiZANidine (ZANAFLEX) 4 MG tablet, Take 1 tablet (4 mg total) by mouth every 8 (eight) hours as needed for muscle spasms., Disp: 30 tablet, Rfl: 0   TYRVAYA 0.03 MG/ACT SOLN, Used for dry eye, Disp: , Rfl:    UNABLE TO FIND, Allergy shots once a week, Disp: , Rfl:   Current Facility-Administered Medications:    0.9 %  sodium chloride infusion, 500 mL, Intravenous, Once, Danis, Estill Cotta III, MD   cyanocobalamin (VITAMIN B12) injection 1,000 mcg, 1,000 mcg, Intramuscular, Once, Isaac Bliss, Rayford Halsted, MD  Review of Systems:  Constitutional: Denies fever, chills, diaphoresis, appetite change and fatigue.  HEENT: Denies photophobia, eye pain, redness, hearing loss, ear pain, congestion, sore throat, rhinorrhea, sneezing, mouth sores,  trouble swallowing, neck pain, neck stiffness and tinnitus.   Respiratory: Denies SOB, DOE, cough, chest tightness,  and wheezing.   Cardiovascular: Denies chest pain, palpitations and leg swelling.  Gastrointestinal: Denies nausea, vomiting, abdominal pain, diarrhea, constipation, blood in stool and abdominal distention.  Genitourinary: Denies dysuria, urgency, frequency, hematuria, flank pain and difficulty urinating.  Endocrine: Denies: hot or cold intolerance, sweats, changes in hair or nails, polyuria, polydipsia. Musculoskeletal: Denies myalgias, back pain, joint swelling, arthralgias and gait problem.  Skin: Denies pallor, rash and wound.  Neurological: Denies dizziness, seizures, syncope, weakness, light-headedness, and headaches.  Hematological: Denies adenopathy. Easy bruising, personal or family bleeding history  Psychiatric/Behavioral: Denies suicidal ideation, mood changes, confusion, nervousness, sleep disturbance and agitation    Physical Exam: Vitals:   08/26/22 1608  BP: 130/84  Pulse: 100  Temp: 98.1 F (36.7 C)  TempSrc: Oral  SpO2: 100%  Weight: 251 lb (113.9 kg)    Body mass index is 43.08 kg/m.   Constitutional: NAD, calm, comfortable Eyes: PERRL, lids and conjunctivae normal, wears corrective lenses ENMT: Mucous membranes are moist.  Respiratory: clear to auscultation bilaterally, no wheezing, no crackles. Normal respiratory effort. No accessory muscle use.  Cardiovascular: Regular rate and rhythm, no murmurs / rubs / gallops. No extremity edema. Marland Kitchen  Psychiatric: Normal judgment and insight. Alert and oriented x 3. Normal mood.    Impression and Plan:  Vitamin B12 deficiency - Plan: cyanocobalamin (VITAMIN B12) injection 1,000 mcg  Restless legs - Plan: Comprehensive metabolic panel, Magnesium  -Suspect tingling legs might be related to either B12 deficiency or electrolyte deficiency. -Check labs today, B12 injection.  Time spent:31 minutes  reviewing chart, interviewing and examining patient and formulating plan of care.      Lelon Frohlich, MD [facility]

## 2022-08-27 LAB — COMPREHENSIVE METABOLIC PANEL
ALT: 15 U/L (ref 0–35)
AST: 17 U/L (ref 0–37)
Albumin: 4.1 g/dL (ref 3.5–5.2)
Alkaline Phosphatase: 93 U/L (ref 39–117)
BUN: 9 mg/dL (ref 6–23)
CO2: 28 mEq/L (ref 19–32)
Calcium: 9.4 mg/dL (ref 8.4–10.5)
Chloride: 102 mEq/L (ref 96–112)
Creatinine, Ser: 1.02 mg/dL (ref 0.40–1.20)
GFR: 61.96 mL/min (ref >60.00)
Glucose, Bld: 105 mg/dL — ABNORMAL HIGH (ref 70–99)
Potassium: 4.1 mEq/L (ref 3.5–5.1)
Sodium: 138 mEq/L (ref 135–145)
Total Bilirubin: 0.3 mg/dL (ref 0.2–1.2)
Total Protein: 8.2 g/dL (ref 6.0–8.3)

## 2022-08-27 LAB — MAGNESIUM: Magnesium: 2 mg/dL (ref 1.5–2.5)

## 2022-08-31 ENCOUNTER — Ambulatory Visit
Admission: RE | Admit: 2022-08-31 | Discharge: 2022-08-31 | Disposition: A | Discharge status: Home / Self Care | Payer: Federal, State, Local not specified - PPO | Source: Ambulatory Visit | Attending: Emergency Medicine | Admitting: Emergency Medicine

## 2022-08-31 VITALS — BP 146/84 | HR 104 | Temp 98.5°F | Resp 18

## 2022-08-31 DIAGNOSIS — J4540 Moderate persistent asthma, uncomplicated: Secondary | ICD-10-CM

## 2022-08-31 DIAGNOSIS — U071 COVID-19: Secondary | ICD-10-CM | POA: Diagnosis not present

## 2022-08-31 MED ORDER — DEXAMETHASONE 6 MG PO TABS: 6.0000 mg | ORAL_TABLET | Freq: Two times a day (BID) | ORAL | 0 refills | Status: AC

## 2022-08-31 MED ORDER — PROMETHAZINE-DM 6.25-15 MG/5ML PO SYRP: 5.0000 mL | ORAL_SOLUTION | Freq: Four times a day (QID) | ORAL | 0 refills | Status: DC | PRN

## 2022-08-31 NOTE — ED Provider Notes (Signed)
[facility]    CSN: [id number] Arrival date & time: 08/31/22  1057    HISTORY   Chief Complaint  Patient presents with   COVID-19   Covid Positive   HPI Catherine Campbell is a pleasant, 57 y.o. female who presents to urgent care today. Patient reports a positive COVID-19 test yesterday.  Patient states she has been experiencing nasal congestion, runny nose, cough and scratchy throat.  Patient states symptoms have been going on for 5 days.    Past Medical History:  Diagnosis Date   Allergy    ASCUS with positive high risk human papillomavirus of vagina 06/2013, 12/2016   Colposcopic biopsy koilocytotic atypia.   Asthma    Hypertension    Low grade squamous intraepithelial lesion (LGSIL) 06/2014, 06/2015, 06/2016, 06/2017, 12/2017, 07/2018   positive high-risk HPV. Colposcopic biopsy LGSIL negative ECC 2015,  ECC 2017 with koilocytotic atypia changes   Patient Active Problem List   Diagnosis Date Noted   Morbid obesity (HCC) 02/06/2020   Vitamin D deficiency 04/04/2019   Vitamin B12 deficiency 04/04/2019   Seasonal allergies 03/31/2018   Asthma, chronic 08/30/2013   RECTAL BLEEDING 07/30/2010   Hyperlipidemia 12/06/2009   Allergic rhinitis 06/03/2009   VAGINITIS 01/09/2008   Acute upper respiratory infection 09/12/2007   Essential hypertension 05/24/2007   Past Surgical History:  Procedure Laterality Date   BOIL UNDER ARM     OB History     Gravida  0   Para  0   Term  0   Preterm  0   AB  0   Living  0      SAB  0   IAB  0   Ectopic  0   Multiple  0   Live Births  0          Home Medications    Prior to Admission medications   Medication Sig Start Date End Date Taking? Authorizing Provider  albuterol (VENTOLIN HFA) 108 (90 Base) MCG/ACT inhaler  04/02/17   [provider]  atorvastatin (LIPITOR) 20 MG tablet TAKE 1 TABLET BY MOUTH EVERY DAY 04/13/22   [name], Limmie Patricia, MD  azelastine (ASTELIN) 0.1 % nasal spray  INSTILL 1 PUFF IN EACH NOSTRIL TWICE A DAY NASALLY 30 DAYS 03/29/16   [provider]  cetirizine (ZYRTEC) 10 MG tablet Take 10 mg by mouth daily.    [provider]  clobetasol cream (TEMOVATE) 0.05 % Apply at bedtime as needed 07/04/20   Theresia Majors, MD  EPIPEN 2-PAK 0.3 MG/0.3ML SOAJ injection Reported on 09/24/2015 07/24/14   [provider]  fexofenadine (ALLEGRA) 180 MG tablet Take 180 mg by mouth daily.    [provider]  fluticasone (FLONASE) 50 MCG/ACT nasal spray INSTILL 2 SPRAYS IN EACH NOSTRIL ONCE A DAY NASALLY 30 DAYS 03/29/16   [provider]  losartan (COZAAR) 50 MG tablet TAKE 2 TABLETS BY MOUTH EVERY DAY 07/27/22   [name], Limmie Patricia, MD  meloxicam (MOBIC) 15 MG tablet Take 1 tablet (15 mg total) by mouth daily. 06/05/22   Richardean Sale, DO  montelukast (SINGULAIR) 10 MG tablet Take 10 mg by mouth at bedtime.  04/30/13   [provider]  naproxen (NAPROSYN) 500 MG tablet Take 1 tablet (500 mg total) by mouth 2 (two) times daily. 05/04/22   Domenick Gong, MD  nystatin (MYCOSTATIN/NYSTOP) powder Apply 1 Application topically 2 (two) times daily. 08/10/22   Olivia Mackie, NP  RESTASIS  0.05 % ophthalmic emulsion INSTILL 1 DROP INTO BOTH EYES TWICE A DAY 02/25/18   [provider]  SYMBICORT 80-4.5 MCG/ACT inhaler  02/10/18   [provider]  tiZANidine (ZANAFLEX) 4 MG tablet Take 1 tablet (4 mg total) by mouth every 8 (eight) hours as needed for muscle spasms. 05/04/22   Domenick Gong, MD  TYRVAYA 0.03 MG/ACT SOLN Used for dry eye 09/17/20   [provider]  UNABLE TO FIND Allergy shots once a week    [provider]    Family History Family History  Problem Relation Age of Onset   Diabetes Father    Hypertension Father    Colon polyps Father    Hypertension Mother    Cancer Mother        Pancreatic   Colon polyps Mother    Rectal cancer Neg Hx    Stomach cancer Neg Hx     Colon cancer Neg Hx    Social History Social History   Tobacco Use   Smoking status: Never   Smokeless tobacco: Never  Vaping Use   Vaping Use: Never used  Substance Use Topics   Alcohol use: No    Alcohol/week: 0.0 standard drinks of alcohol   Drug use: No   Allergies   Doxycycline, Metaxalone, Penicillins, and Tramadol-acetaminophen  Review of Systems Review of Systems Pertinent findings revealed after performing a 14 point review of systems has been noted in the history of present illness.  Physical Exam Triage Vital Signs ED Triage Vitals  Enc Vitals Group     BP 06/20/21 0827 (!) 147/82     Pulse Rate 06/20/21 0827 72     Resp 06/20/21 0827 18     Temp 06/20/21 0827 98.3 F (36.8 C)     Temp Source 06/20/21 0827 Oral     SpO2 06/20/21 0827 98 %     Weight --      Height --      Head Circumference --      Peak Flow --      Pain Score 06/20/21 0826 5     Pain Loc --      Pain Edu? --      Excl. in GC? --   No data found.  Updated Vital Signs BP (!) 146/84 (BP Location: Left Arm)   Pulse (!) 104   Temp 98.5 F (36.9 C) (Oral)   Resp 18   LMP 04/05/2020   SpO2 95%   Physical Exam Vitals and nursing note reviewed.  Constitutional:      General: She is not in acute distress.    Appearance: Normal appearance. She is not ill-appearing.  HENT:     Head: Normocephalic and atraumatic.     Salivary Glands: Right salivary gland is not diffusely enlarged or tender. Left salivary gland is not diffusely enlarged or tender.     Right Ear: Tympanic membrane, ear canal and external ear normal. No drainage. No middle ear effusion. There is no impacted cerumen. Tympanic membrane is not erythematous or bulging.     Left Ear: Tympanic membrane, ear canal and external ear normal. No drainage.  No middle ear effusion. There is no impacted cerumen. Tympanic membrane is not erythematous or bulging.     Nose: Nose normal. No nasal deformity, septal deviation, mucosal edema,  congestion or rhinorrhea.     Right Turbinates: Not enlarged, swollen or pale.     Left Turbinates: Not enlarged, swollen or pale.  Right Sinus: No maxillary sinus tenderness or frontal sinus tenderness.     Left Sinus: No maxillary sinus tenderness or frontal sinus tenderness.     Mouth/Throat:     Lips: Pink. No lesions.     Mouth: Mucous membranes are moist. No oral lesions.     Pharynx: Oropharynx is clear. Uvula midline. No posterior oropharyngeal erythema or uvula swelling.     Tonsils: No tonsillar exudate. 0 on the right. 0 on the left.  Eyes:     General: Lids are normal.        Right eye: No discharge.        Left eye: No discharge.     Extraocular Movements: Extraocular movements intact.     Conjunctiva/sclera: Conjunctivae normal.     Right eye: Right conjunctiva is not injected.     Left eye: Left conjunctiva is not injected.  Neck:     Trachea: Trachea and phonation normal.  Cardiovascular:     Rate and Rhythm: Normal rate and regular rhythm.     Pulses: Normal pulses.     Heart sounds: Normal heart sounds. No murmur heard.    No friction rub. No gallop.  Pulmonary:     Effort: Pulmonary effort is normal. No accessory muscle usage, prolonged expiration or respiratory distress.     Breath sounds: Normal breath sounds. No stridor, decreased air movement or transmitted upper airway sounds. No decreased breath sounds, wheezing, rhonchi or rales.  Chest:     Chest wall: No tenderness.  Musculoskeletal:        General: Normal range of motion.     Cervical back: Normal range of motion and neck supple. Normal range of motion.  Lymphadenopathy:     Cervical: No cervical adenopathy.  Skin:    General: Skin is warm and dry.     Findings: No erythema or rash.  Neurological:     General: No focal deficit present.     Mental Status: She is alert and oriented to person, place, and time.  Psychiatric:        Mood and Affect: Mood normal.        Behavior: Behavior normal.      Visual Acuity Right Eye Distance:   Left Eye Distance:   Bilateral Distance:    Right Eye Near:   Left Eye Near:    Bilateral Near:     UC Couse / Diagnostics / Procedures:     Radiology No results found.  Procedures Procedures (including critical care time) EKG  Pending results:  Labs Reviewed - No data to display  Medications Ordered in UC: Medications - No data to display  UC Diagnoses / Final Clinical Impressions(s)   I have reviewed the triage vital signs and the nursing notes.  Pertinent labs & imaging results that were available during my care of the patient were reviewed by me and considered in my medical decision making (see chart for details).    Final diagnoses:  COVID-19  Moderate persistent asthma without complication   Physical exam today is reassuring.  Patient provided with Promethazine DM for nighttime cough and dexamethasone to keep her lungs calm.  Patient provided with education regarding COVID-19 and self-care at home.  Return precautions advised. Please see discharge instructions below for further details of plan of care as provided to patient. ED Prescriptions     Medication Sig Dispense Auth. Provider   dexamethasone (DECADRON) 6 MG tablet Take 1 tablet (6 mg total) by mouth 2 (  two) times daily with a meal for 5 days. 10 tablet Theadora Rama Scales, PA-C   promethazine-dextromethorphan (PROMETHAZINE-DM) 6.25-15 MG/5ML syrup Take 5 mLs by mouth 4 (four) times daily as needed for cough. 118 mL Theadora Rama Scales, PA-C      PDMP not reviewed this encounter.  Disposition Upon Discharge:  Condition: stable for discharge home Home: take medications as prescribed; routine discharge instructions as discussed; follow up as advised.  Patient presented with an acute illness with associated systemic symptoms and significant discomfort requiring urgent management. In my opinion, this is a condition that a prudent lay person (someone who  possesses an average knowledge of health and medicine) may potentially expect to result in complications if not addressed urgently such as respiratory distress, impairment of bodily function or dysfunction of bodily organs.   Routine symptom specific, illness specific and/or disease specific instructions were discussed with the patient and/or caregiver at length.   As such, the patient has been evaluated and assessed, work-up was performed and treatment was provided in alignment with urgent care protocols and evidence based medicine.  Patient/parent/caregiver has been advised that the patient may require follow up for further testing and treatment if the symptoms continue in spite of treatment, as clinically indicated and appropriate.  If the patient was tested for COVID-19, Influenza and/or RSV, then the patient/parent/guardian was advised to isolate at home pending the results of his/her diagnostic coronavirus test and potentially longer if they're positive. I have also advised pt that if his/her COVID-19 test returns positive, it's recommended to self-isolate for at least 10 days after symptoms first appeared AND until fever-free for 24 hours without fever reducer AND other symptoms have improved or resolved. Discussed self-isolation recommendations as well as instructions for household member/close contacts as per the [facility] and [place] DHHS, and also gave patient the COVID packet with this information.  Patient/parent/caregiver has been advised to return to the [facility] or PCP in 3-5 days if no better; to PCP or the Emergency Department if new signs and symptoms develop, or if the current signs or symptoms continue to change or worsen for further workup, evaluation and treatment as clinically indicated and appropriate  The patient will follow up with their current PCP if and as advised. If the patient does not currently have a PCP we will assist them in obtaining one.   The patient may need specialty follow up  if the symptoms continue, in spite of conservative treatment and management, for further workup, evaluation, consultation and treatment as clinically indicated and appropriate.  Patient/parent/caregiver verbalized understanding and agreement of plan as discussed.  All questions were addressed during visit.  Please see discharge instructions below for further details of plan.  Discharge Instructions:   Discharge Instructions      I have enclosed information about COVID-19 that I hope you find helpful.  Because it has been more than 5 days since your symptoms began, you would no longer benefit from antiviral therapy.  Please begin dexamethasone 1 tablet twice daily for the next 5 days.  This will keep your lungs calm and prevent asthma exacerbation.  Please be sure that you are using your inhalers as prescribed.  I have sent you prescription for Promethazine DM which is both a nasal decongestant and an antinausea medication that makes most patients feel fairly sleepy.  The DM is dextromethorphan, a cough suppressant found in many over-the-counter cough medications.  Please take 5 mL before bedtime to minimize your cough which will help you  sleep better.   Please follow-up with your primary care provider if you are not feeling any better in the next 3 to 5 days.  Thank you for visiting urgent care today.      This office note has been dictated using Museum/gallery curator.  Unfortunately, this method of dictation can sometimes lead to typographical or grammatical errors.  I apologize for your inconvenience in advance if this occurs.  Please do not hesitate to reach out to me if clarification is needed.      Lynden Oxford Scales, PA-C 08/31/22 1136

## 2022-08-31 NOTE — ED Triage Notes (Signed)
Pt states at home covid test was positive yesterday. The patient c/o congestion, cough, and scratchy throat.   Started: Thursday   Home interventions: dayquil

## 2022-08-31 NOTE — Discharge Instructions (Addendum)
I have enclosed information about COVID-19 that I hope you find helpful.  Because it has been more than 5 days since your symptoms began, you would no longer benefit from antiviral therapy.  Please begin dexamethasone 1 tablet twice daily for the next 5 days.  This will keep your lungs calm and prevent asthma exacerbation.  Please be sure that you are using your inhalers as prescribed.  I have sent you prescription for Promethazine DM which is both a nasal decongestant and an antinausea medication that makes most patients feel fairly sleepy.  The DM is dextromethorphan, a cough suppressant found in many over-the-counter cough medications.  Please take 5 mL before bedtime to minimize your cough which will help you sleep better.   Please follow-up with your primary care provider if you are not feeling any better in the next 3 to 5 days.  Thank you for visiting urgent care today.

## 2022-09-07 ENCOUNTER — Telehealth: Payer: Self-pay

## 2022-09-07 NOTE — Telephone Encounter (Addendum)
Patient called because she had Colpo scheduled this Thursday, 09/10/22 and she recently had Covid. Wanted to see if she needed to reschedule.  Her sx began on 08/27/22 and subsequently tested positive for Covid on 08/30/22.  She is fine now with only some lingering congestion. No fever or other sx. She tested negative on 09/05/22.  I advised her she was fine to come to her visit but we would ask her to mask at that visit. She was quite agreeable to that as she said she is going to return to practicing masking after having had Covid.

## 2022-09-10 ENCOUNTER — Other Ambulatory Visit (HOSPITAL_COMMUNITY)
Admission: RE | Admit: 2022-09-10 | Discharge: 2022-09-10 | Disposition: A | Discharge status: Home / Self Care | Payer: Federal, State, Local not specified - PPO | Source: Ambulatory Visit | Attending: Obstetrics and Gynecology | Admitting: Obstetrics and Gynecology

## 2022-09-10 ENCOUNTER — Ambulatory Visit: Payer: Federal, State, Local not specified - PPO | Admitting: Obstetrics and Gynecology

## 2022-09-10 ENCOUNTER — Encounter: Payer: Self-pay | Admitting: Obstetrics and Gynecology

## 2022-09-10 VITALS — BP 118/74 | HR 72 | Ht 65.0 in | Wt 245.0 lb

## 2022-09-10 DIAGNOSIS — N841 Polyp of cervix uteri: Secondary | ICD-10-CM | POA: Diagnosis not present

## 2022-09-10 DIAGNOSIS — R87810 Cervical high risk human papillomavirus (HPV) DNA test positive: Secondary | ICD-10-CM

## 2022-09-10 DIAGNOSIS — N72 Inflammatory disease of cervix uteri: Secondary | ICD-10-CM | POA: Diagnosis not present

## 2022-09-10 DIAGNOSIS — R87610 Atypical squamous cells of undetermined significance on cytologic smear of cervix (ASC-US): Secondary | ICD-10-CM | POA: Diagnosis not present

## 2022-09-10 NOTE — Progress Notes (Signed)
GYNECOLOGY  VISIT   HPI: 57 y.o.   Single Black or African American Not Hispanic or Latino  female   [id number] with Patient's last menstrual period was 04/05/2020.   here for evaluation of an ASCUS, +HPV pap from 08/10/22.   Prior pap 12/22 ASCUS/-HPV  She has a h/o LSIL with + HPV several years ago. Last colposcopy was in 3/20 and was normal with a negative ECC.   GYNECOLOGIC HISTORY: Patient's last menstrual period was 04/05/2020. Contraception:PMP Menopausal hormone therapy: none        OB History     Gravida  0   Para  0   Term  0   Preterm  0   AB  0   Living  0      SAB  0   IAB  0   Ectopic  0   Multiple  0   Live Births  0              Patient Active Problem List   Diagnosis Date Noted   Morbid obesity ([place]) 02/06/2020   Vitamin D deficiency 04/04/2019   Vitamin B12 deficiency 04/04/2019   Seasonal allergies 03/31/2018   Asthma, chronic 08/30/2013   RECTAL BLEEDING 07/30/2010   Hyperlipidemia 12/06/2009   Allergic rhinitis 06/03/2009   VAGINITIS 01/09/2008   Acute upper respiratory infection 09/12/2007   Essential hypertension 05/24/2007    Past Medical History:  Diagnosis Date   Allergy    ASCUS with positive high risk human papillomavirus of vagina 06/2013, 12/2016   Colposcopic biopsy koilocytotic atypia.   Asthma    Hypertension    Low grade squamous intraepithelial lesion (LGSIL) 06/2014, 06/2015, 06/2016, 06/2017, 12/2017, 07/2018   positive high-risk HPV. Colposcopic biopsy LGSIL negative ECC 2015,  ECC 2017 with koilocytotic atypia changes    Past Surgical History:  Procedure Laterality Date   BOIL UNDER ARM      Current Outpatient Medications  Medication Sig Dispense Refill   albuterol (VENTOLIN HFA) 108 (90 Base) MCG/ACT inhaler      atorvastatin (LIPITOR) 20 MG tablet TAKE 1 TABLET BY MOUTH EVERY DAY 90 tablet 1   azelastine (ASTELIN) 0.1 % nasal spray INSTILL 1 PUFF IN EACH NOSTRIL TWICE A DAY NASALLY 30 DAYS  3    cetirizine (ZYRTEC) 10 MG tablet Take 10 mg by mouth daily.     clobetasol cream (TEMOVATE) 0.05 % Apply at bedtime as needed 60 g 1   EPIPEN 2-PAK 0.3 MG/0.3ML SOAJ injection Reported on 09/24/2015  0   fexofenadine (ALLEGRA) 180 MG tablet Take 180 mg by mouth daily.     fluticasone (FLONASE) 50 MCG/ACT nasal spray INSTILL 2 SPRAYS IN EACH NOSTRIL ONCE A DAY NASALLY 30 DAYS  3   losartan (COZAAR) 50 MG tablet TAKE 2 TABLETS BY MOUTH EVERY DAY 180 tablet 1   meloxicam (MOBIC) 15 MG tablet Take 1 tablet (15 mg total) by mouth daily. 30 tablet 0   montelukast (SINGULAIR) 10 MG tablet Take 10 mg by mouth at bedtime.      naproxen (NAPROSYN) 500 MG tablet Take 1 tablet (500 mg total) by mouth 2 (two) times daily. 20 tablet 0   nystatin (MYCOSTATIN/NYSTOP) powder Apply 1 Application topically 2 (two) times daily. 60 g 0   promethazine-dextromethorphan (PROMETHAZINE-DM) 6.25-15 MG/5ML syrup Take 5 mLs by mouth 4 (four) times daily as needed for cough. 118 mL 0   RESTASIS 0.05 % ophthalmic emulsion INSTILL 1 DROP INTO BOTH EYES TWICE A  DAY  3   SYMBICORT 80-4.5 MCG/ACT inhaler      tiZANidine (ZANAFLEX) 4 MG tablet Take 1 tablet (4 mg total) by mouth every 8 (eight) hours as needed for muscle spasms. 30 tablet 0   TYRVAYA 0.03 MG/ACT SOLN Used for dry eye     UNABLE TO FIND Allergy shots once a week     No current facility-administered medications for this visit.     ALLERGIES: Doxycycline, Metaxalone, Penicillins, and Tramadol-acetaminophen  Family History  Problem Relation Age of Onset   Diabetes Father    Hypertension Father    Colon polyps Father    Hypertension Mother    Cancer Mother        Pancreatic   Colon polyps Mother    Rectal cancer Neg Hx    Stomach cancer Neg Hx    Colon cancer Neg Hx     Social History   Socioeconomic History   Marital status: Single    Spouse name: Not on file   Number of children: Not on file   Years of education: Not on file   Highest education  level: Bachelor's degree (e.g., BA, AB, BS)  Occupational History   Not on file  Tobacco Use   Smoking status: Never   Smokeless tobacco: Never  Vaping Use   Vaping Use: Never used  Substance and Sexual Activity   Alcohol use: No    Alcohol/week: 0.0 standard drinks of alcohol   Drug use: No   Sexual activity: Not Currently    Birth control/protection: Abstinence    Comment: 1st intercourse 57 yo-Fewer than 5 partners,des neg  Other Topics Concern   Not on file  Social History Narrative   Not on file   Social Determinants of Health   Financial Resource Strain: Low Risk  (10/20/2021)   Overall Financial Resource Strain (CARDIA)    Difficulty of Paying Living Expenses: Not hard at all  Food Insecurity: No Food Insecurity (10/20/2021)   Hunger Vital Sign    Worried About Running Out of Food in the Last Year: Never true    Ran Out of Food in the Last Year: Never true  Transportation Needs: No Transportation Needs (10/20/2021)   PRAPARE - Administrator, Civil Service (Medical): No    Lack of Transportation (Non-Medical): No  Physical Activity: Insufficiently Active (10/20/2021)   Exercise Vital Sign    Days of Exercise per Week: 3 days    Minutes of Exercise per Session: 40 min  Stress: No Stress Concern Present (10/20/2021)   [employer] of Occupational Health - Occupational Stress Questionnaire    Feeling of Stress : Not at all  Social Connections: Moderately Integrated (10/20/2021)   Social Connection and Isolation Panel [NHANES]    Frequency of Communication with Friends and Family: More than three times a week    Frequency of Social Gatherings with Friends and Family: More than three times a week    Attends Religious Services: More than 4 times per year    Active Member of [employer] or Organizations: Yes    Attends Engineer, structural: More than 4 times per year    Marital Status: Never married  Intimate Partner Violence: Not on file     ROS  PHYSICAL EXAMINATION:    LMP 04/05/2020     General appearance: alert, cooperative and appears stated age  Pelvic: External genitalia:  no lesions  Urethra:  normal appearing urethra with no masses, tenderness or lesions              Bartholins and Skenes: normal                 Vagina: normal appearing vagina with normal color and discharge, no lesions              Cervix:  polyp seen  Colposcopy: not satisfactory, no aceto-white changes. Negative lugols examination of the cervix and upper vagina. Cervical polyp removed with ringed forceps. ECC done.                 Chaperone was present for exam.  1. Atypical squamous cell changes of undetermined significance (ASCUS) on cervical cytology with positive high risk human papilloma virus (HPV) - Colposcopy - Surgical pathology( [place]/ [facility])  2. Cervical polyp - Surgical pathology( [place]/ [facility])

## 2022-09-10 NOTE — Patient Instructions (Signed)

## 2022-09-14 LAB — SURGICAL PATHOLOGY

## 2022-09-24 DIAGNOSIS — J3081 Allergic rhinitis due to animal (cat) (dog) hair and dander: Secondary | ICD-10-CM | POA: Diagnosis not present

## 2022-09-24 DIAGNOSIS — J3089 Other allergic rhinitis: Secondary | ICD-10-CM | POA: Diagnosis not present

## 2022-09-24 DIAGNOSIS — J301 Allergic rhinitis due to pollen: Secondary | ICD-10-CM | POA: Diagnosis not present

## 2022-10-01 DIAGNOSIS — J3089 Other allergic rhinitis: Secondary | ICD-10-CM | POA: Diagnosis not present

## 2022-10-01 DIAGNOSIS — J301 Allergic rhinitis due to pollen: Secondary | ICD-10-CM | POA: Diagnosis not present

## 2022-10-08 DIAGNOSIS — J3081 Allergic rhinitis due to animal (cat) (dog) hair and dander: Secondary | ICD-10-CM | POA: Diagnosis not present

## 2022-10-08 DIAGNOSIS — J301 Allergic rhinitis due to pollen: Secondary | ICD-10-CM | POA: Diagnosis not present

## 2022-10-08 DIAGNOSIS — J3089 Other allergic rhinitis: Secondary | ICD-10-CM | POA: Diagnosis not present

## 2022-10-15 DIAGNOSIS — J3089 Other allergic rhinitis: Secondary | ICD-10-CM | POA: Diagnosis not present

## 2022-10-15 DIAGNOSIS — J301 Allergic rhinitis due to pollen: Secondary | ICD-10-CM | POA: Diagnosis not present

## 2022-10-19 ENCOUNTER — Other Ambulatory Visit: Payer: Self-pay | Admitting: Internal Medicine

## 2022-10-19 DIAGNOSIS — E785 Hyperlipidemia, unspecified: Secondary | ICD-10-CM

## 2022-10-22 DIAGNOSIS — J3089 Other allergic rhinitis: Secondary | ICD-10-CM | POA: Diagnosis not present

## 2022-10-22 DIAGNOSIS — J3081 Allergic rhinitis due to animal (cat) (dog) hair and dander: Secondary | ICD-10-CM | POA: Diagnosis not present

## 2022-10-22 DIAGNOSIS — J301 Allergic rhinitis due to pollen: Secondary | ICD-10-CM | POA: Diagnosis not present

## 2022-10-27 DIAGNOSIS — J3089 Other allergic rhinitis: Secondary | ICD-10-CM | POA: Diagnosis not present

## 2022-10-27 DIAGNOSIS — J3081 Allergic rhinitis due to animal (cat) (dog) hair and dander: Secondary | ICD-10-CM | POA: Diagnosis not present

## 2022-10-27 DIAGNOSIS — J301 Allergic rhinitis due to pollen: Secondary | ICD-10-CM | POA: Diagnosis not present

## 2022-11-04 DIAGNOSIS — J3081 Allergic rhinitis due to animal (cat) (dog) hair and dander: Secondary | ICD-10-CM | POA: Diagnosis not present

## 2022-11-04 DIAGNOSIS — J301 Allergic rhinitis due to pollen: Secondary | ICD-10-CM | POA: Diagnosis not present

## 2022-11-04 DIAGNOSIS — J3089 Other allergic rhinitis: Secondary | ICD-10-CM | POA: Diagnosis not present

## 2022-11-12 DIAGNOSIS — J3081 Allergic rhinitis due to animal (cat) (dog) hair and dander: Secondary | ICD-10-CM | POA: Diagnosis not present

## 2022-11-12 DIAGNOSIS — J301 Allergic rhinitis due to pollen: Secondary | ICD-10-CM | POA: Diagnosis not present

## 2022-11-12 DIAGNOSIS — J3089 Other allergic rhinitis: Secondary | ICD-10-CM | POA: Diagnosis not present

## 2022-11-17 DIAGNOSIS — Z1231 Encounter for screening mammogram for malignant neoplasm of breast: Secondary | ICD-10-CM | POA: Diagnosis not present

## 2022-11-17 LAB — HM MAMMOGRAPHY

## 2022-11-23 DIAGNOSIS — J301 Allergic rhinitis due to pollen: Secondary | ICD-10-CM | POA: Diagnosis not present

## 2022-11-23 DIAGNOSIS — J3081 Allergic rhinitis due to animal (cat) (dog) hair and dander: Secondary | ICD-10-CM | POA: Diagnosis not present

## 2022-11-23 DIAGNOSIS — J3089 Other allergic rhinitis: Secondary | ICD-10-CM | POA: Diagnosis not present

## 2022-11-30 ENCOUNTER — Ambulatory Visit
Admission: EM | Admit: 2022-11-30 | Discharge: 2022-11-30 | Disposition: A | Discharge status: Home / Self Care | Payer: Federal, State, Local not specified - PPO | Attending: Urgent Care | Admitting: Urgent Care

## 2022-11-30 ENCOUNTER — Ambulatory Visit: Payer: Self-pay

## 2022-11-30 DIAGNOSIS — J0181 Other acute recurrent sinusitis: Secondary | ICD-10-CM | POA: Diagnosis not present

## 2022-11-30 DIAGNOSIS — J309 Allergic rhinitis, unspecified: Secondary | ICD-10-CM | POA: Diagnosis not present

## 2022-11-30 DIAGNOSIS — J4540 Moderate persistent asthma, uncomplicated: Secondary | ICD-10-CM | POA: Diagnosis not present

## 2022-11-30 MED ORDER — LEVOFLOXACIN 750 MG PO TABS: 750.0000 mg | ORAL_TABLET | Freq: Every day | ORAL | 0 refills | Status: DC

## 2022-11-30 MED ORDER — PREDNISONE 20 MG PO TABS
ORAL_TABLET | ORAL | 0 refills | Status: DC
Start: 2022-11-30 — End: 2022-12-17

## 2022-11-30 NOTE — ED Triage Notes (Signed)
Pt reports nasal congestion x 3 weeks. Allergies meds gives no relief.

## 2022-11-30 NOTE — ED Provider Notes (Signed)
[facility]  Note:  This document was prepared using Conservation officer, historic buildings and may include unintentional dictation errors.  MRN: [id number] DOB: [birth year]  Subjective:   Catherine Campbell is a 57 y.o. female presenting for 3-week history of persistent and worsening sinus congestion, sinus drainage, sinus fullness.  Has been feeling more wheezing and chest tightness.  Has allergic rhinitis and asthma.  Takes Zyrtec, Allegra, Singulair, Flonase. Uses her albuterol inhaler as needed. No smoking of any kind including cigarettes, cigars, vaping, marijuana use.    No current facility-administered medications for this encounter.  Current Outpatient Medications:    albuterol (VENTOLIN HFA) 108 (90 Base) MCG/ACT inhaler, , Disp: , Rfl:    atorvastatin (LIPITOR) 20 MG tablet, TAKE 1 TABLET BY MOUTH EVERY DAY, Disp: 90 tablet, Rfl: 0   azelastine (ASTELIN) 0.1 % nasal spray, INSTILL 1 PUFF IN EACH NOSTRIL TWICE A DAY NASALLY 30 DAYS, Disp: , Rfl: 3   cetirizine (ZYRTEC) 10 MG tablet, Take 10 mg by mouth daily., Disp: , Rfl:    clobetasol cream (TEMOVATE) 0.05 %, Apply at bedtime as needed, Disp: 60 g, Rfl: 1   EPIPEN 2-PAK 0.3 MG/0.3ML SOAJ injection, Reported on 09/24/2015, Disp: , Rfl: 0   fexofenadine (ALLEGRA) 180 MG tablet, Take 180 mg by mouth daily., Disp: , Rfl:    fluticasone (FLONASE) 50 MCG/ACT nasal spray, INSTILL 2 SPRAYS IN EACH NOSTRIL ONCE A DAY NASALLY 30 DAYS, Disp: , Rfl: 3   losartan (COZAAR) 50 MG tablet, TAKE 2 TABLETS BY MOUTH EVERY DAY, Disp: 180 tablet, Rfl: 1   montelukast (SINGULAIR) 10 MG tablet, Take 10 mg by mouth at bedtime. , Disp: , Rfl:    naproxen (NAPROSYN) 500 MG tablet, Take 1 tablet (500 mg total) by mouth 2 (two) times daily., Disp: 20 tablet, Rfl: 0   nystatin (MYCOSTATIN/NYSTOP) powder, Apply 1 Application topically 2 (two) times daily., Disp: 60 g, Rfl: 0   promethazine-dextromethorphan (PROMETHAZINE-DM) 6.25-15 MG/5ML  syrup, Take 5 mLs by mouth 4 (four) times daily as needed for cough., Disp: 118 mL, Rfl: 0   RESTASIS 0.05 % ophthalmic emulsion, INSTILL 1 DROP INTO BOTH EYES TWICE A DAY, Disp: , Rfl: 3   SYMBICORT 80-4.5 MCG/ACT inhaler, , Disp: , Rfl:    TYRVAYA 0.03 MG/ACT SOLN, Used for dry eye, Disp: , Rfl:    UNABLE TO FIND, Allergy shots once a week, Disp: , Rfl:    Allergies  Allergen Reactions   Doxycycline Hives   Metaxalone Hives   Penicillins Hives   Tramadol-Acetaminophen Hives    Past Medical History:  Diagnosis Date   Allergy    ASCUS with positive high risk human papillomavirus of vagina 06/2013, 12/2016   Colposcopic biopsy koilocytotic atypia.   Asthma    Hypertension    Low grade squamous intraepithelial lesion (LGSIL) 06/2014, 06/2015, 06/2016, 06/2017, 12/2017, 07/2018   positive high-risk HPV. Colposcopic biopsy LGSIL negative ECC 2015,  ECC 2017 with koilocytotic atypia changes     Past Surgical History:  Procedure Laterality Date   BOIL UNDER ARM      Family History  Problem Relation Age of Onset   Diabetes Father    Hypertension Father    Colon polyps Father    Hypertension Mother    Cancer Mother        Pancreatic   Colon polyps Mother    Rectal cancer Neg Hx    Stomach cancer Neg Hx    Colon  cancer Neg Hx     Social History   Tobacco Use   Smoking status: Never   Smokeless tobacco: Never  Vaping Use   Vaping Use: Never used  Substance Use Topics   Alcohol use: No    Alcohol/week: 0.0 standard drinks of alcohol   Drug use: No    ROS   Objective:   Vitals: BP (!) 149/88 (BP Location: Left Arm)   Pulse 96   Resp 18   LMP 04/05/2020   SpO2 97%   Physical Exam Constitutional:      General: She is not in acute distress.    Appearance: Normal appearance. She is well-developed and normal weight. She is not ill-appearing, toxic-appearing or diaphoretic.  HENT:     Head: Normocephalic and atraumatic.     Right Ear: Tympanic membrane, ear  canal and external ear normal. No drainage or tenderness. No middle ear effusion. There is no impacted cerumen. Tympanic membrane is not erythematous or bulging.     Left Ear: Tympanic membrane, ear canal and external ear normal. No drainage or tenderness.  No middle ear effusion. There is no impacted cerumen. Tympanic membrane is not erythematous or bulging.     Nose: Congestion present. No rhinorrhea.     Comments: Nasal mucosa boggy and erythematous.    Mouth/Throat:     Mouth: Mucous membranes are moist. No oral lesions.     Pharynx: No pharyngeal swelling, oropharyngeal exudate, posterior oropharyngeal erythema or uvula swelling.     Tonsils: No tonsillar exudate or tonsillar abscesses.  Eyes:     General: No scleral icterus.       Right eye: No discharge.        Left eye: No discharge.     Extraocular Movements: Extraocular movements intact.     Right eye: Normal extraocular motion.     Left eye: Normal extraocular motion.     Conjunctiva/sclera: Conjunctivae normal.  Cardiovascular:     Rate and Rhythm: Normal rate and regular rhythm.     Heart sounds: Normal heart sounds. No murmur heard.    No friction rub. No gallop.  Pulmonary:     Effort: No respiratory distress.     Breath sounds: No stridor. No wheezing, rhonchi or rales.     Comments: Slight decrease in lung sounds throughout. Chest:     Chest wall: No tenderness.  Musculoskeletal:     Cervical back: Normal range of motion and neck supple.  Lymphadenopathy:     Cervical: No cervical adenopathy.  Skin:    General: Skin is warm and dry.  Neurological:     General: No focal deficit present.     Mental Status: She is alert and oriented to person, place, and time.  Psychiatric:        Mood and Affect: Mood normal.        Behavior: Behavior normal.        Thought Content: Thought content normal.        Judgment: Judgment normal.     Assessment and Plan :   PDMP not reviewed this encounter.  1. Other acute  recurrent sinusitis   2. Allergic rhinitis, unspecified seasonality, unspecified trigger   3. Moderate persistent asthma, uncomplicated     Will start empiric treatment for sinusitis with levofloxacin given antibiotic allergies. Start prednisone to help with her asthma, allergies. Deferred imaging given clear cardiopulmonary exam, hemodynamically stable vital signs.  Recommended supportive care otherwise. Counseled patient on potential for adverse effects  with medications prescribed/recommended today, ER and return-to-clinic precautions discussed, patient verbalized understanding.    Wallis Bamberg, PA-C 11/30/22 1728

## 2022-11-30 NOTE — Discharge Instructions (Addendum)
We will manage this as a sinus infection with levofloxacin because of your medication allergies. Use prednisone in the context of your asthma, allergies.  For sore throat or cough try using a honey-based tea. Use 3 teaspoons of honey with juice squeezed from half lemon. Place shaved pieces of ginger into 1/2-1 cup of water and warm over stove top. Then mix the ingredients and repeat every 4 hours as needed. Please take Tylenol 500mg -650mg  every 6 hours for throat pain, fevers, aches and pains. Hydrate very well with at least 2 liters of water. Eat light meals such as soups (chicken and noodles, vegetable, chicken and wild rice).  Do not eat foods that you are allergic to.  Taking an antihistamine like Zyrtec can help against postnasal drainage, sinus congestion which can cause sinus pain, sinus headaches, throat pain, painful swallowing, coughing.  You can take this together with cough medication as needed.

## 2022-12-01 ENCOUNTER — Encounter: Payer: Federal, State, Local not specified - PPO | Admitting: Internal Medicine

## 2022-12-09 DIAGNOSIS — J301 Allergic rhinitis due to pollen: Secondary | ICD-10-CM | POA: Diagnosis not present

## 2022-12-09 DIAGNOSIS — J3089 Other allergic rhinitis: Secondary | ICD-10-CM | POA: Diagnosis not present

## 2022-12-17 ENCOUNTER — Ambulatory Visit: Payer: Federal, State, Local not specified - PPO | Admitting: Nurse Practitioner

## 2022-12-17 VITALS — BP 124/78 | HR 99

## 2022-12-17 DIAGNOSIS — J3089 Other allergic rhinitis: Secondary | ICD-10-CM | POA: Diagnosis not present

## 2022-12-17 DIAGNOSIS — J301 Allergic rhinitis due to pollen: Secondary | ICD-10-CM | POA: Diagnosis not present

## 2022-12-17 DIAGNOSIS — B372 Candidiasis of skin and nail: Secondary | ICD-10-CM

## 2022-12-17 MED ORDER — FLUCONAZOLE 150 MG PO TABS: 150.0000 mg | ORAL_TABLET | ORAL | 0 refills | Status: AC

## 2022-12-17 MED ORDER — NYSTATIN-TRIAMCINOLONE 100000-0.1 UNIT/GM-% EX OINT: 1.0000 | TOPICAL_OINTMENT | Freq: Two times a day (BID) | CUTANEOUS | 0 refills | Status: DC

## 2022-12-17 NOTE — Progress Notes (Signed)
Acute Office Visit  Subjective:    Patient ID: Catherine Campbell, female    DOB: May 10, 1966, 57 y.o.   MRN: [id number]   HPI 57 y.o. presents today for groin rash. H/O fungal infection in this area. Nystatin has worked well in the past. Rash is very itchy and red. On antibiotics recently for URI.   Patient's last menstrual period was 04/05/2020.    Review of Systems  Constitutional: Negative.   Skin:  Positive for rash (Bilateral groin).       Objective:    Physical Exam Constitutional:      Appearance: Normal appearance.  Skin:    Findings: Rash present.          BP 124/78   Pulse 99   LMP 04/05/2020   SpO2 98%  Wt Readings from Last 3 Encounters:  09/10/22 245 lb (111.1 kg)  08/26/22 251 lb (113.9 kg)  08/10/22 250 lb 3.2 oz (113.5 kg)        Patient informed chaperone available to be present for breast and/or pelvic exam. Patient has requested no chaperone to be present. Patient has been advised what will be completed during breast and pelvic exam.   Assessment & Plan:   Problem List Items Addressed This Visit   None Visit Diagnoses     Skin yeast infection    -  Primary   Relevant Medications   nystatin-triamcinolone ointment (MYCOLOG)   fluconazole (DIFLUCAN) 150 MG tablet      Plan: Rash consistent with fungal infection. Diflucan 150 mg every other day x 14 days, mycolog BID x 7-10 days. Keep area clean and dry to prevent recurrences.      Olivia Mackie DNP, 4:21 PM 12/17/2022

## 2022-12-24 DIAGNOSIS — J3089 Other allergic rhinitis: Secondary | ICD-10-CM | POA: Diagnosis not present

## 2022-12-24 DIAGNOSIS — J3081 Allergic rhinitis due to animal (cat) (dog) hair and dander: Secondary | ICD-10-CM | POA: Diagnosis not present

## 2022-12-24 DIAGNOSIS — J301 Allergic rhinitis due to pollen: Secondary | ICD-10-CM | POA: Diagnosis not present

## 2022-12-28 DIAGNOSIS — J3089 Other allergic rhinitis: Secondary | ICD-10-CM | POA: Diagnosis not present

## 2022-12-28 DIAGNOSIS — H1045 Other chronic allergic conjunctivitis: Secondary | ICD-10-CM | POA: Diagnosis not present

## 2022-12-28 DIAGNOSIS — J4540 Moderate persistent asthma, uncomplicated: Secondary | ICD-10-CM | POA: Diagnosis not present

## 2022-12-28 DIAGNOSIS — J301 Allergic rhinitis due to pollen: Secondary | ICD-10-CM | POA: Diagnosis not present

## 2023-01-06 DIAGNOSIS — J301 Allergic rhinitis due to pollen: Secondary | ICD-10-CM | POA: Diagnosis not present

## 2023-01-06 DIAGNOSIS — J3081 Allergic rhinitis due to animal (cat) (dog) hair and dander: Secondary | ICD-10-CM | POA: Diagnosis not present

## 2023-01-06 DIAGNOSIS — J3089 Other allergic rhinitis: Secondary | ICD-10-CM | POA: Diagnosis not present

## 2023-01-13 DIAGNOSIS — J301 Allergic rhinitis due to pollen: Secondary | ICD-10-CM | POA: Diagnosis not present

## 2023-01-13 DIAGNOSIS — J3089 Other allergic rhinitis: Secondary | ICD-10-CM | POA: Diagnosis not present

## 2023-01-18 ENCOUNTER — Other Ambulatory Visit: Payer: Self-pay | Admitting: Internal Medicine

## 2023-01-18 DIAGNOSIS — I10 Essential (primary) hypertension: Secondary | ICD-10-CM

## 2023-01-18 DIAGNOSIS — E785 Hyperlipidemia, unspecified: Secondary | ICD-10-CM

## 2023-01-20 DIAGNOSIS — J3089 Other allergic rhinitis: Secondary | ICD-10-CM | POA: Diagnosis not present

## 2023-01-20 DIAGNOSIS — J301 Allergic rhinitis due to pollen: Secondary | ICD-10-CM | POA: Diagnosis not present

## 2023-01-20 DIAGNOSIS — J3081 Allergic rhinitis due to animal (cat) (dog) hair and dander: Secondary | ICD-10-CM | POA: Diagnosis not present

## 2023-01-27 ENCOUNTER — Ambulatory Visit (INDEPENDENT_AMBULATORY_CARE_PROVIDER_SITE_OTHER): Payer: Federal, State, Local not specified - PPO | Admitting: Internal Medicine

## 2023-01-27 ENCOUNTER — Encounter: Payer: Self-pay | Admitting: Internal Medicine

## 2023-01-27 VITALS — BP 110/80 | HR 80 | Temp 98.2°F | Ht 65.0 in | Wt 255.0 lb

## 2023-01-27 DIAGNOSIS — Z0000 Encounter for general adult medical examination without abnormal findings: Secondary | ICD-10-CM | POA: Diagnosis not present

## 2023-01-27 DIAGNOSIS — E559 Vitamin D deficiency, unspecified: Secondary | ICD-10-CM | POA: Diagnosis not present

## 2023-01-27 DIAGNOSIS — Z1159 Encounter for screening for other viral diseases: Secondary | ICD-10-CM

## 2023-01-27 DIAGNOSIS — I10 Essential (primary) hypertension: Secondary | ICD-10-CM | POA: Diagnosis not present

## 2023-01-27 DIAGNOSIS — Z114 Encounter for screening for human immunodeficiency virus [HIV]: Secondary | ICD-10-CM

## 2023-01-27 DIAGNOSIS — E538 Deficiency of other specified B group vitamins: Secondary | ICD-10-CM

## 2023-01-27 DIAGNOSIS — E785 Hyperlipidemia, unspecified: Secondary | ICD-10-CM

## 2023-01-27 DIAGNOSIS — J3081 Allergic rhinitis due to animal (cat) (dog) hair and dander: Secondary | ICD-10-CM | POA: Diagnosis not present

## 2023-01-27 DIAGNOSIS — J3089 Other allergic rhinitis: Secondary | ICD-10-CM | POA: Diagnosis not present

## 2023-01-27 DIAGNOSIS — J301 Allergic rhinitis due to pollen: Secondary | ICD-10-CM | POA: Diagnosis not present

## 2023-01-27 LAB — COMPREHENSIVE METABOLIC PANEL
ALT: 15 U/L (ref 0–35)
AST: 19 U/L (ref 0–37)
Albumin: 4.1 g/dL (ref 3.5–5.2)
Alkaline Phosphatase: 88 U/L (ref 39–117)
BUN: 14 mg/dL (ref 6–23)
CO2: 23 mEq/L (ref 19–32)
Calcium: 9.5 mg/dL (ref 8.4–10.5)
Chloride: 105 mEq/L (ref 96–112)
Creatinine, Ser: 0.99 mg/dL (ref 0.40–1.20)
GFR: 64.03 mL/min (ref >60.00)
Glucose, Bld: 85 mg/dL (ref 70–99)
Potassium: 4 mEq/L (ref 3.5–5.1)
Sodium: 141 mEq/L (ref 135–145)
Total Bilirubin: 0.4 mg/dL (ref 0.2–1.2)
Total Protein: 7.9 g/dL (ref 6.0–8.3)

## 2023-01-27 LAB — CBC WITH DIFFERENTIAL/PLATELET
Basophils Absolute: 0 10*3/uL (ref 0.0–0.1)
Basophils Relative: 0.7 % (ref 0.0–3.0)
Eosinophils Absolute: 0.1 10*3/uL (ref 0.0–0.7)
Eosinophils Relative: 2.8 % (ref 0.0–5.0)
HCT: 36.3 % (ref 36.0–46.0)
Hemoglobin: 11.9 g/dL — ABNORMAL LOW (ref 12.0–15.0)
Lymphocytes Relative: 31.4 % (ref 12.0–46.0)
Lymphs Abs: 1.5 10*3/uL (ref 0.7–4.0)
MCHC: 32.6 g/dL (ref 30.0–36.0)
MCV: 85.6 fl (ref 78.0–100.0)
Monocytes Absolute: 0.4 10*3/uL (ref 0.1–1.0)
Monocytes Relative: 8.2 % (ref 3.0–12.0)
Neutro Abs: 2.7 10*3/uL (ref 1.4–7.7)
Neutrophils Relative %: 56.9 % (ref 43.0–77.0)
Platelets: 308 10*3/uL (ref 150.0–400.0)
RBC: 4.25 Mil/uL (ref 3.87–5.11)
RDW: 15.1 % (ref 11.5–15.5)
WBC: 4.7 10*3/uL (ref 4.0–10.5)

## 2023-01-27 LAB — HEMOGLOBIN A1C: Hgb A1c MFr Bld: 5.9 % (ref 4.6–6.5)

## 2023-01-27 LAB — LIPID PANEL
Cholesterol: 160 mg/dL (ref 0–200)
HDL: 60.3 mg/dL (ref >39.00)
LDL Cholesterol: 83 mg/dL (ref 0–99)
NonHDL: 99.65
Total CHOL/HDL Ratio: 3
Triglycerides: 82 mg/dL (ref 0.0–149.0)
VLDL: 16.4 mg/dL (ref 0.0–40.0)

## 2023-01-27 LAB — VITAMIN D 25 HYDROXY (VIT D DEFICIENCY, FRACTURES): VITD: 23.14 ng/mL — ABNORMAL LOW (ref 30.00–100.00)

## 2023-01-27 LAB — VITAMIN B12: Vitamin B-12: >1500 pg/mL — ABNORMAL HIGH (ref 211–911)

## 2023-01-27 LAB — TSH: TSH: 2.15 u[IU]/mL (ref 0.35–5.50)

## 2023-01-27 MED ORDER — CYANOCOBALAMIN 1000 MCG/ML IJ SOLN
1000.0000 ug | Freq: Once | INTRAMUSCULAR | Status: AC
Start: 2023-01-27 — End: 2023-01-27
  Administered 2023-01-27: 1000 ug via INTRAMUSCULAR

## 2023-01-27 NOTE — Progress Notes (Signed)
Established Patient Office Visit     CC/Reason for Visit: Annual preventive exam and follow-up chronic medical conditions  HPI: Catherine Campbell is a 57 y.o. female who is coming in today for the above mentioned reasons. Past Medical History is significant for: Hypertension, hyperlipidemia, vitamin D and B12 deficiencies, obesity.  She is doing well and has no acute concerns or complaints.  She is due for B12 injection today.  Immunizations are up-to-date, cancer screening is up-to-date.   Past Medical/Surgical History: Past Medical History:  Diagnosis Date   Allergy    ASCUS with positive high risk human papillomavirus of vagina 06/2013, 12/2016   Colposcopic biopsy koilocytotic atypia.   Asthma    Hypertension    Low grade squamous intraepithelial lesion (LGSIL) 06/2014, 06/2015, 06/2016, 06/2017, 12/2017, 07/2018   positive high-risk HPV. Colposcopic biopsy LGSIL negative ECC 2015,  ECC 2017 with koilocytotic atypia changes    Past Surgical History:  Procedure Laterality Date   BOIL UNDER ARM      Social History:  reports that she has never smoked. She has never used smokeless tobacco. She reports that she does not drink alcohol and does not use drugs.  Allergies: Allergies  Allergen Reactions   Doxycycline Hives   Metaxalone Hives   Penicillins Hives   Tramadol-Acetaminophen Hives    Family History:  Family History  Problem Relation Age of Onset   Diabetes Father    Hypertension Father    Colon polyps Father    Hypertension Mother    Cancer Mother        Pancreatic   Colon polyps Mother    Rectal cancer Neg Hx    Stomach cancer Neg Hx    Colon cancer Neg Hx      Current Outpatient Medications:    albuterol (VENTOLIN HFA) 108 (90 Base) MCG/ACT inhaler, , Disp: , Rfl:    atorvastatin (LIPITOR) 20 MG tablet, TAKE 1 TABLET BY MOUTH EVERY DAY, Disp: 90 tablet, Rfl: 0   azelastine (ASTELIN) 0.1 % nasal spray, INSTILL 1 PUFF IN EACH NOSTRIL TWICE A DAY  NASALLY 30 DAYS, Disp: , Rfl: 3   cetirizine (ZYRTEC) 10 MG tablet, Take 10 mg by mouth daily., Disp: , Rfl:    EPIPEN 2-PAK 0.3 MG/0.3ML SOAJ injection, Reported on 09/24/2015, Disp: , Rfl: 0   fexofenadine (ALLEGRA) 180 MG tablet, Take 180 mg by mouth daily., Disp: , Rfl:    fluticasone (FLONASE) 50 MCG/ACT nasal spray, INSTILL 2 SPRAYS IN EACH NOSTRIL ONCE A DAY NASALLY 30 DAYS, Disp: , Rfl: 3   losartan (COZAAR) 50 MG tablet, TAKE 2 TABLETS BY MOUTH EVERY DAY, Disp: 180 tablet, Rfl: 0   montelukast (SINGULAIR) 10 MG tablet, Take 10 mg by mouth at bedtime. , Disp: , Rfl:    naproxen (NAPROSYN) 500 MG tablet, Take 1 tablet (500 mg total) by mouth 2 (two) times daily., Disp: 20 tablet, Rfl: 0   nystatin (MYCOSTATIN/NYSTOP) powder, Apply 1 Application topically 2 (two) times daily., Disp: 60 g, Rfl: 0   nystatin-triamcinolone ointment (MYCOLOG), Apply 1 Application topically 2 (two) times daily., Disp: 30 g, Rfl: 0   RESTASIS 0.05 % ophthalmic emulsion, INSTILL 1 DROP INTO BOTH EYES TWICE A DAY, Disp: , Rfl: 3   SYMBICORT 80-4.5 MCG/ACT inhaler, , Disp: , Rfl:    UNABLE TO FIND, Allergy shots once a week, Disp: , Rfl:   Review of Systems:  Negative unless indicated in HPI.   Physical Exam: Vitals:  01/27/23 0805  BP: 110/80  Pulse: 80  Temp: 98.2 F (36.8 C)  TempSrc: Oral  SpO2: 98%  Weight: 255 lb (115.7 kg)  Height: 5\' 5"  (1.651 m)    Body mass index is 42.43 kg/m.   Physical Exam Vitals reviewed.  Constitutional:      General: She is not in acute distress.    Appearance: Normal appearance. She is not ill-appearing, toxic-appearing or diaphoretic.  HENT:     Head: Normocephalic.     Right Ear: Tympanic membrane, ear canal and external ear normal. There is no impacted cerumen.     Left Ear: Tympanic membrane, ear canal and external ear normal. There is no impacted cerumen.     Nose: Nose normal.     Mouth/Throat:     Mouth: Mucous membranes are moist.     Pharynx:  Oropharynx is clear. No oropharyngeal exudate or posterior oropharyngeal erythema.  Eyes:     General: No scleral icterus.       Right eye: No discharge.        Left eye: No discharge.     Conjunctiva/sclera: Conjunctivae normal.     Pupils: Pupils are equal, round, and reactive to light.  Neck:     Vascular: No carotid bruit.  Cardiovascular:     Rate and Rhythm: Normal rate and regular rhythm.     Pulses: Normal pulses.     Heart sounds: Normal heart sounds.  Pulmonary:     Effort: Pulmonary effort is normal. No respiratory distress.     Breath sounds: Normal breath sounds.  Abdominal:     General: Abdomen is flat. Bowel sounds are normal.     Palpations: Abdomen is soft.  Musculoskeletal:        General: Normal range of motion.     Cervical back: Normal range of motion.  Skin:    General: Skin is warm and dry.  Neurological:     General: No focal deficit present.     Mental Status: She is alert and oriented to person, place, and time. Mental status is at baseline.  Psychiatric:        Mood and Affect: Mood normal.        Behavior: Behavior normal.        Thought Content: Thought content normal.        Judgment: Judgment normal.     Flowsheet Row Office Visit from 01/27/2023 in [facility] [place]  PHQ-9 Total Score 0        Impression and Plan:  Encounter for preventive health examination  Vitamin B12 deficiency -     Vitamin B12; Future -     Cyanocobalamin  Vitamin D deficiency -     VITAMIN D 25 Hydroxy (Vit-D Deficiency, Fractures); Future  Hyperlipidemia, unspecified hyperlipidemia type -     Lipid panel; Future  Essential hypertension -     Comprehensive metabolic panel; Future -     CBC with Differential/Platelet; Future  Morbid obesity (HCC) -     TSH; Future -     Hemoglobin A1c  Encounter for hepatitis C screening test for low risk patient -     Hepatitis C antibody; Future  Encounter for screening for HIV -      HIV Antibody (routine testing w rflx); Future   -Recommend routine eye and dental care. -Healthy lifestyle discussed in detail. -Labs to be updated today. -Prostate cancer screening: N/A Health Maintenance  Topic Date Due  HIV Screening  Never done   Hepatitis C Screening  Never done   COVID-19 Vaccine (5 - 2023-24 season) 04/24/2022   Flu Shot  03/25/2023   Mammogram  11/16/2024   Pap Smear  08/10/2025   Colon Cancer Screening  05/26/2028   DTaP/Tdap/Td vaccine (2 - Td or Tdap) 04/03/2029   Zoster (Shingles) Vaccine  Completed   HPV Vaccine  Aged Out        Eudell Mcphee [name], MD [facility]

## 2023-01-28 ENCOUNTER — Other Ambulatory Visit: Payer: Self-pay | Admitting: Internal Medicine

## 2023-01-28 ENCOUNTER — Encounter: Payer: Self-pay | Admitting: Internal Medicine

## 2023-01-28 DIAGNOSIS — E559 Vitamin D deficiency, unspecified: Secondary | ICD-10-CM

## 2023-01-28 DIAGNOSIS — R7302 Impaired glucose tolerance (oral): Secondary | ICD-10-CM | POA: Insufficient documentation

## 2023-01-28 LAB — HEPATITIS C ANTIBODY: Hepatitis C Ab: NONREACTIVE

## 2023-01-28 LAB — HIV ANTIBODY (ROUTINE TESTING W REFLEX): HIV 1&2 Ab, 4th Generation: NONREACTIVE

## 2023-01-28 MED ORDER — VITAMIN D (ERGOCALCIFEROL) 1.25 MG (50000 UNIT) PO CAPS
50000.0000 [IU] | ORAL_CAPSULE | ORAL | 0 refills | Status: AC
Start: 2023-01-28 — End: 2023-04-16

## 2023-02-03 DIAGNOSIS — J301 Allergic rhinitis due to pollen: Secondary | ICD-10-CM | POA: Diagnosis not present

## 2023-02-03 DIAGNOSIS — J3081 Allergic rhinitis due to animal (cat) (dog) hair and dander: Secondary | ICD-10-CM | POA: Diagnosis not present

## 2023-02-03 DIAGNOSIS — J3089 Other allergic rhinitis: Secondary | ICD-10-CM | POA: Diagnosis not present

## 2023-02-10 DIAGNOSIS — J301 Allergic rhinitis due to pollen: Secondary | ICD-10-CM | POA: Diagnosis not present

## 2023-02-10 DIAGNOSIS — J3089 Other allergic rhinitis: Secondary | ICD-10-CM | POA: Diagnosis not present

## 2023-02-10 DIAGNOSIS — J3081 Allergic rhinitis due to animal (cat) (dog) hair and dander: Secondary | ICD-10-CM | POA: Diagnosis not present

## 2023-02-16 ENCOUNTER — Ambulatory Visit
Admission: RE | Admit: 2023-02-16 | Discharge: 2023-02-16 | Disposition: A | Discharge status: Home / Self Care | Payer: Federal, State, Local not specified - PPO | Source: Ambulatory Visit | Attending: Nurse Practitioner | Admitting: Nurse Practitioner

## 2023-02-16 VITALS — BP 153/82 | HR 102 | Temp 98.7°F | Resp 19

## 2023-02-16 DIAGNOSIS — M7989 Other specified soft tissue disorders: Secondary | ICD-10-CM | POA: Diagnosis not present

## 2023-02-16 NOTE — ED Provider Notes (Signed)
[facility]    CSN: [id number] Arrival date & time: 02/16/23  1202      History   Chief Complaint Chief Complaint  Patient presents with   Foot Swelling    HPI Catherine Campbell is a 57 y.o. female presents for lower extremity swelling.  Patient reports left foot swelling over the past 3 days that only slightly improves with elevation.  Denies any injury or known inciting event.  She has been working out more and doing activities such as Psychiatrist.  She denies any calf pain or redness.  No history of PAD, PVD, or DVT.  Denies chest pain, shortness of breath, orthopnea.  Denies any history of kidney or CHF.  Denies any recent long distance travel.  No new medications.  Does report she is drinking more water recently.  Denies any changes in weight over the past 3 days.  Denies change in salt.  No other concerns at this time.  HPI  Past Medical History:  Diagnosis Date   Allergy    ASCUS with positive high risk human papillomavirus of vagina 06/2013, 12/2016   Colposcopic biopsy koilocytotic atypia.   Asthma    Hypertension    Low grade squamous intraepithelial lesion (LGSIL) 06/2014, 06/2015, 06/2016, 06/2017, 12/2017, 07/2018   positive high-risk HPV. Colposcopic biopsy LGSIL negative ECC 2015,  ECC 2017 with koilocytotic atypia changes    Patient Active Problem List   Diagnosis Date Noted   IGT (impaired glucose tolerance) 01/28/2023   Morbid obesity (HCC) 02/06/2020   Vitamin D deficiency 04/04/2019   Vitamin B12 deficiency 04/04/2019   Seasonal allergies 03/31/2018   Asthma, chronic 08/30/2013   RECTAL BLEEDING 07/30/2010   Hyperlipidemia 12/06/2009   Allergic rhinitis 06/03/2009   VAGINITIS 01/09/2008   Acute upper respiratory infection 09/12/2007   Essential hypertension 05/24/2007    Past Surgical History:  Procedure Laterality Date   BOIL UNDER ARM      OB History     Gravida  0   Para  0   Term  0   Preterm  0   AB  0   Living  0       SAB  0   IAB  0   Ectopic  0   Multiple  0   Live Births  0            Home Medications    Prior to Admission medications   Medication Sig Start Date End Date Taking? Authorizing Provider  Vitamin D, Ergocalciferol, (DRISDOL) 1.25 MG (50000 UNIT) CAPS capsule Take 1 capsule (50,000 Units total) by mouth every 7 (seven) days for 12 doses. 01/28/23 04/16/23  [name], Limmie Patricia, MD  albuterol (VENTOLIN HFA) 108 [phone] Base) MCG/ACT inhaler  04/02/17   [provider]  atorvastatin (LIPITOR) 20 MG tablet TAKE 1 TABLET BY MOUTH EVERY DAY 01/19/23   [name], Limmie Patricia, MD  azelastine (ASTELIN) 0.1 % nasal spray INSTILL 1 PUFF IN EACH NOSTRIL TWICE A DAY NASALLY 30 DAYS 03/29/16   [provider]  cetirizine (ZYRTEC) 10 MG tablet Take 10 mg by mouth daily.    [provider]  EPIPEN 2-PAK 0.3 MG/0.3ML SOAJ injection Reported on 09/24/2015 07/24/14   [provider]  fexofenadine (ALLEGRA) 180 MG tablet Take 180 mg by mouth daily.    [provider]  fluticasone (FLONASE) 50 MCG/ACT nasal spray INSTILL 2 SPRAYS IN EACH NOSTRIL ONCE A DAY NASALLY 30 DAYS 03/29/16  [provider]  losartan (COZAAR) 50 MG tablet TAKE 2 TABLETS BY MOUTH EVERY DAY 01/19/23   [name], Limmie Patricia, MD  montelukast (SINGULAIR) 10 MG tablet Take 10 mg by mouth at bedtime.  04/30/13   [provider]  naproxen (NAPROSYN) 500 MG tablet Take 1 tablet (500 mg total) by mouth 2 (two) times daily. 05/04/22   Domenick Gong, MD  nystatin (MYCOSTATIN/NYSTOP) powder Apply 1 Application topically 2 (two) times daily. 08/10/22   Olivia Mackie, NP  nystatin-triamcinolone ointment (MYCOLOG) Apply 1 Application topically 2 (two) times daily. 12/17/22   Wyline Beady A, NP  RESTASIS 0.05 % ophthalmic emulsion INSTILL 1 DROP INTO BOTH EYES TWICE A DAY 02/25/18   [provider]  SYMBICORT 80-4.5 MCG/ACT inhaler  02/10/18   [provider]  UNABLE TO FIND Allergy shots once a week    [provider]    Family History Family History  Problem Relation Age of Onset   Diabetes Father    Hypertension Father    Colon polyps Father    Hypertension Mother    Cancer Mother        Pancreatic   Colon polyps Mother    Rectal cancer Neg Hx    Stomach cancer Neg Hx    Colon cancer Neg Hx     Social History Social History   Tobacco Use   Smoking status: Never   Smokeless tobacco: Never  Vaping Use   Vaping Use: Never used  Substance Use Topics   Alcohol use: No    Alcohol/week: 0.0 standard drinks of alcohol   Drug use: No     Allergies   Doxycycline, Metaxalone, Penicillins, and Tramadol-acetaminophen   Review of Systems Review of Systems  Cardiovascular:  Positive for leg swelling.     Physical Exam Triage Vital Signs ED Triage Vitals  Enc Vitals Group     BP 02/16/23 1208 (!) 153/82     Pulse Rate 02/16/23 1207 (!) 102     Resp 02/16/23 1207 19     Temp 02/16/23 1207 98.7 F (37.1 C)     Temp Source 02/16/23 1207 Oral     SpO2 02/16/23 1207 96 %     Weight --      Height --      Head Circumference --      Peak Flow --      Pain Score 02/16/23 1206 6     Pain Loc --      Pain Edu? --      Excl. in GC? --    No data found.  Updated Vital Signs BP (!) 153/82   Pulse (!) 102   Temp 98.7 F (37.1 C) (Oral)   Resp 19   LMP 04/05/2020   SpO2 96%   Visual Acuity Right Eye Distance:   Left Eye Distance:   Bilateral Distance:    Right Eye Near:   Left Eye Near:    Bilateral Near:     Physical Exam Vitals and nursing note reviewed.  Constitutional:      General: She is not in acute distress.    Appearance: Normal appearance. She is not ill-appearing, toxic-appearing or diaphoretic.  HENT:     Head: Normocephalic and atraumatic.  Eyes:     Pupils: Pupils are equal, round, and reactive to light.  Cardiovascular:     Rate and Rhythm: Normal rate and regular  rhythm.     Pulses:  Dorsalis pedis pulses are 2+ on the right side and 2+ on the left side.     Heart sounds: Normal heart sounds.     Comments: Edema extends to knee on left and to mid shin on right.  No posterior calf tenderness to palpation bilaterally.  DP is +2 bilaterally.  Left calf measures 46.5 cm and right measures 47.5 cm.  There is no erythema or warmth to bilateral lower extremities. Pulmonary:     Effort: Pulmonary effort is normal.     Breath sounds: Normal breath sounds.  Musculoskeletal:     Right lower leg: 1+ Pitting Edema present.     Left lower leg: 1+ Pitting Edema present.  Skin:    General: Skin is warm and dry.  Neurological:     General: No focal deficit present.     Mental Status: She is alert and oriented to person, place, and time.  Psychiatric:        Mood and Affect: Mood normal.        Behavior: Behavior normal.    Clinical feature Score     Active cancer (treatment ongoing or within the previous six months or palliative) 0  Paralysis, paresis, or recent plaster immobilization of the lower extremities 0  Recently bedridden for more than three days or major surgery, within four weeks                                                    0  Localized tenderness along the distribution of the deep venous system 0  Entire leg swollen 0  Calf swelling by more than 3 cm when compared to the asymptomatic leg (measured below tibial tuberosity) 0  Pitting edema (greater in the symptomatic leg) 0  Collateral superficial veins (nonvaricose) 0  Alternative diagnosis as likely or more likely than that of deep venous thrombosis -2                                                                                                                                                             Total Score 0     Interpretation    High probability  3 or greater  Moderate probability 1 or 2  Low probability 0 or less  Modification   This clinical model has been  modified to take one other clinical feature into account: a previously documented deep vein thrombosis (DVT) is given the score of 1. Using this modified scoring system, DVT is either likely or unlikely, as follows:  DVT likely 2 or greater   DVT unlikely 1 or less     UC Treatments / Results  Labs (all labs ordered are listed, but only abnormal results are displayed) Labs Reviewed  BASIC METABOLIC PANEL   Comprehensive metabolic panel Order: [id number] Status: Final result     Visible to patient: Yes (not seen)     Next appt: 07/29/2023 at 08:00 AM in Family Medicine [facility] [name], MD)     Dx: Essential hypertension   2 Result Notes     1 Patient Communication           Component Ref Range & Units 2 wk ago (01/27/23) 5 mo ago (08/26/22) 1 yr ago (11/12/21) 2 yr ago (10/18/20) 3 yr ago (04/04/19) 4 yr ago (03/31/18) 5 yr ago (03/15/17) 5 yr ago (03/15/17)  Sodium 135 - 145 mEq/L 141 138 138 142 138 142  138  Potassium 3.5 - 5.1 mEq/L 4.0 4.1 3.9 4.3 4.2 4.0  4.0  Chloride 96 - 112 mEq/L 105 102 103 104 104 106  106  CO2 19 - 32 mEq/L 23 28 27 31 28 29  25   Glucose, Bld 70 - 99 mg/dL 85 086 High  73 83 86 90  89  BUN 6 - 23 mg/dL 14 9 14 13 11 11  13   Creatinine, Ser 0.40 - 1.20 mg/dL 5.78 4.69 6.29 5.28 4.13 0.93  0.91  Total Bilirubin 0.2 - 1.2 mg/dL 0.4 0.3 0.5 0.4 0.3 0.4 0.6   Alkaline Phosphatase 39 - 117 U/L 88 93 98 94 77 76 70   AST 0 - 37 U/L 19 17 20 14 13 12 12    ALT 0 - 35 U/L 15 15 17 12 12 9 8    Total Protein 6.0 - 8.3 g/dL 7.9 8.2 8.0 7.7 7.3 7.3 6.8   Albumin 3.5 - 5.2 g/dL 4.1 4.1 4.2 4.0 3.9 3.9 3.6   GFR >60.00 mL/min 64.03 61.96 CM 69.61 CM 67.51 CM 81.63 81.73  84.16  Comment: Calculated using the CKD-EPI Creatinine Equation (2021)  Calcium 8.4 - 10.5 mg/dL 9.5 9.4 9.6 9.5 9.3 9.3  8.9  Resulting Agency [place] [facility] [place] [facility] [place] [facility] [place] [facility] [place] [facility] [place] [facility] [place] [facility] [place] [facility]              EKG   Radiology No results found.  Procedures Procedures (including critical care time)  Medications Ordered in UC Medications - No data to display  Initial Impression / Assessment and Plan / UC Course  I have reviewed the triage vital signs and the nursing notes.  Pertinent labs & imaging results that were available during my care of the patient were reviewed by me and considered in my medical decision making (see chart for details).     Reviewed exam and symptoms with patient.  Bilateral lower extremity pitting edema slightly worse on left.  Wells score of 0.  No posterior calf tenderness, warmth or erythema.  No other signs of fluid overload or respiratory compromise.  Patient did recently have labs at the beginning of the month with her PCP and were normal.  Will recheck a BMP to reassess kidney function to make sure there is been no changes.  Advised her to elevate her legs, monitor her salt and fluid intake.  I also advised her to call her PCP today to make a follow-up appointment in 1 to 2 days for recheck.  Strict ER precautions reviewed and patient verbalized understanding. Final Clinical Impressions(s) / UC Diagnoses   Final diagnoses:  Swelling of left lower extremity     Discharge  Instructions      The clinic will contact you with results your blood work done today.  This should be available tomorrow.  When she does elevate both of your legs to see if that improves her swelling.  Monitor your fluid intake.  When she to contact your PCP today to make a follow-up appointment with them in the next couple days for recheck.  Please go to the ER if you develop any worsening symptoms.   ED Prescriptions   None    PDMP not reviewed this encounter.   Radford Pax, NP 02/16/23 1235

## 2023-02-16 NOTE — ED Triage Notes (Signed)
Pt presents with c/o lt foot swelling x 3 days. Pt states she feels uncomfortable in the shoe she is wearing.

## 2023-02-16 NOTE — Discharge Instructions (Addendum)
The clinic will contact you with results your blood work done today.  This should be available tomorrow.  Elevate both of your legs to see if that improves your swelling.  Monitor your fluid intake.  Please contact your PCP today to make a follow-up appointment with them in the next 1-2 days for recheck.  Please go to the ER if you develop any worsening symptoms.

## 2023-02-17 DIAGNOSIS — J301 Allergic rhinitis due to pollen: Secondary | ICD-10-CM | POA: Diagnosis not present

## 2023-02-17 DIAGNOSIS — J3081 Allergic rhinitis due to animal (cat) (dog) hair and dander: Secondary | ICD-10-CM | POA: Diagnosis not present

## 2023-02-17 DIAGNOSIS — J3089 Other allergic rhinitis: Secondary | ICD-10-CM | POA: Diagnosis not present

## 2023-02-17 LAB — BASIC METABOLIC PANEL
BUN/Creatinine Ratio: 12 (ref 9–23)
BUN: 11 mg/dL (ref 6–24)
CO2: 22 mmol/L (ref 20–29)
Calcium: 9.1 mg/dL (ref 8.7–10.2)
Chloride: 108 mmol/L — ABNORMAL HIGH (ref 96–106)
Creatinine, Ser: 0.93 mg/dL (ref 0.57–1.00)
Glucose: 108 mg/dL — ABNORMAL HIGH (ref 70–99)
Potassium: 3.8 mmol/L (ref 3.5–5.2)
Sodium: 144 mmol/L (ref 134–144)
eGFR: 73 mL/min/{1.73_m2} (ref >59)

## 2023-02-24 DIAGNOSIS — J3089 Other allergic rhinitis: Secondary | ICD-10-CM | POA: Diagnosis not present

## 2023-02-24 DIAGNOSIS — J301 Allergic rhinitis due to pollen: Secondary | ICD-10-CM | POA: Diagnosis not present

## 2023-03-03 DIAGNOSIS — J3089 Other allergic rhinitis: Secondary | ICD-10-CM | POA: Diagnosis not present

## 2023-03-03 DIAGNOSIS — J3081 Allergic rhinitis due to animal (cat) (dog) hair and dander: Secondary | ICD-10-CM | POA: Diagnosis not present

## 2023-03-03 DIAGNOSIS — J301 Allergic rhinitis due to pollen: Secondary | ICD-10-CM | POA: Diagnosis not present

## 2023-03-10 DIAGNOSIS — J301 Allergic rhinitis due to pollen: Secondary | ICD-10-CM | POA: Diagnosis not present

## 2023-03-10 DIAGNOSIS — J3089 Other allergic rhinitis: Secondary | ICD-10-CM | POA: Diagnosis not present

## 2023-03-17 DIAGNOSIS — J3089 Other allergic rhinitis: Secondary | ICD-10-CM | POA: Diagnosis not present

## 2023-03-17 DIAGNOSIS — J301 Allergic rhinitis due to pollen: Secondary | ICD-10-CM | POA: Diagnosis not present

## 2023-03-17 DIAGNOSIS — J3081 Allergic rhinitis due to animal (cat) (dog) hair and dander: Secondary | ICD-10-CM | POA: Diagnosis not present

## 2023-03-24 DIAGNOSIS — J301 Allergic rhinitis due to pollen: Secondary | ICD-10-CM | POA: Diagnosis not present

## 2023-03-24 DIAGNOSIS — J3089 Other allergic rhinitis: Secondary | ICD-10-CM | POA: Diagnosis not present

## 2023-03-31 DIAGNOSIS — J3089 Other allergic rhinitis: Secondary | ICD-10-CM | POA: Diagnosis not present

## 2023-03-31 DIAGNOSIS — J3081 Allergic rhinitis due to animal (cat) (dog) hair and dander: Secondary | ICD-10-CM | POA: Diagnosis not present

## 2023-03-31 DIAGNOSIS — J301 Allergic rhinitis due to pollen: Secondary | ICD-10-CM | POA: Diagnosis not present

## 2023-04-05 ENCOUNTER — Telehealth: Payer: Self-pay | Admitting: Internal Medicine

## 2023-04-05 NOTE — Telephone Encounter (Signed)
Pt last b12 injection was jan 2024 is it ok to sch or does pt need to have lab first

## 2023-04-07 DIAGNOSIS — J3089 Other allergic rhinitis: Secondary | ICD-10-CM | POA: Diagnosis not present

## 2023-04-07 DIAGNOSIS — J301 Allergic rhinitis due to pollen: Secondary | ICD-10-CM | POA: Diagnosis not present

## 2023-04-14 ENCOUNTER — Other Ambulatory Visit: Payer: Self-pay | Admitting: Internal Medicine

## 2023-04-14 DIAGNOSIS — E559 Vitamin D deficiency, unspecified: Secondary | ICD-10-CM

## 2023-04-15 DIAGNOSIS — J301 Allergic rhinitis due to pollen: Secondary | ICD-10-CM | POA: Diagnosis not present

## 2023-04-15 DIAGNOSIS — J3089 Other allergic rhinitis: Secondary | ICD-10-CM | POA: Diagnosis not present

## 2023-04-16 ENCOUNTER — Other Ambulatory Visit: Payer: Self-pay | Admitting: Internal Medicine

## 2023-04-16 DIAGNOSIS — E785 Hyperlipidemia, unspecified: Secondary | ICD-10-CM

## 2023-04-16 DIAGNOSIS — I10 Essential (primary) hypertension: Secondary | ICD-10-CM

## 2023-04-28 DIAGNOSIS — J3089 Other allergic rhinitis: Secondary | ICD-10-CM | POA: Diagnosis not present

## 2023-04-28 DIAGNOSIS — J301 Allergic rhinitis due to pollen: Secondary | ICD-10-CM | POA: Diagnosis not present

## 2023-05-02 ENCOUNTER — Other Ambulatory Visit: Payer: Self-pay | Admitting: Sports Medicine

## 2023-05-06 DIAGNOSIS — J301 Allergic rhinitis due to pollen: Secondary | ICD-10-CM | POA: Diagnosis not present

## 2023-05-06 DIAGNOSIS — J3089 Other allergic rhinitis: Secondary | ICD-10-CM | POA: Diagnosis not present

## 2023-05-12 DIAGNOSIS — J3089 Other allergic rhinitis: Secondary | ICD-10-CM | POA: Diagnosis not present

## 2023-05-12 DIAGNOSIS — J301 Allergic rhinitis due to pollen: Secondary | ICD-10-CM | POA: Diagnosis not present

## 2023-05-12 DIAGNOSIS — J3081 Allergic rhinitis due to animal (cat) (dog) hair and dander: Secondary | ICD-10-CM | POA: Diagnosis not present

## 2023-05-19 DIAGNOSIS — J301 Allergic rhinitis due to pollen: Secondary | ICD-10-CM | POA: Diagnosis not present

## 2023-05-19 DIAGNOSIS — J3089 Other allergic rhinitis: Secondary | ICD-10-CM | POA: Diagnosis not present

## 2023-05-19 DIAGNOSIS — J3081 Allergic rhinitis due to animal (cat) (dog) hair and dander: Secondary | ICD-10-CM | POA: Diagnosis not present

## 2023-05-25 ENCOUNTER — Ambulatory Visit
Admission: EM | Admit: 2023-05-25 | Discharge: 2023-05-25 | Disposition: A | Discharge status: Home / Self Care | Payer: Federal, State, Local not specified - PPO | Attending: Internal Medicine | Admitting: Internal Medicine

## 2023-05-25 DIAGNOSIS — M62830 Muscle spasm of back: Secondary | ICD-10-CM

## 2023-05-25 DIAGNOSIS — J301 Allergic rhinitis due to pollen: Secondary | ICD-10-CM | POA: Diagnosis not present

## 2023-05-25 MED ORDER — CYCLOBENZAPRINE HCL 10 MG PO TABS
10.0000 mg | ORAL_TABLET | Freq: Two times a day (BID) | ORAL | 0 refills | Status: DC | PRN
Start: 2023-05-25 — End: 2023-06-07

## 2023-05-25 MED ORDER — KETOROLAC TROMETHAMINE 30 MG/ML IJ SOLN
30.0000 mg | Freq: Once | INTRAMUSCULAR | Status: AC
  Administered 2023-05-25: 30 mg via INTRAMUSCULAR

## 2023-05-25 NOTE — ED Triage Notes (Signed)
Pt presents with c/o lt arm should and arm pain X 3 days  States she slept wrong and c/o possibly pulling a muscle.   Home interventions: Tylenol

## 2023-05-25 NOTE — Discharge Instructions (Signed)
You were given a Toradol injection in clinic today. Do not take any over the counter NSAID's such as Advil, ibuprofen, Aleve, or naproxen for 24 hours. You may take tylenol if needed.  You may take Flexeril as needed.  Please note this medication can make you drowsy.  Do not drink alcohol or drive on this medication.  Heat to the shoulder as needed and rest.  Please follow-up with your PCP if your symptoms do not improve.  Please go to the emergency room if you develop any worsening symptoms.  I hope you feel better soon!

## 2023-05-25 NOTE — ED Provider Notes (Signed)
[facility]    CSN: [id number] Arrival date & time: 05/25/23  1557      History   Chief Complaint Chief Complaint  Patient presents with   Arm Pain    HPI Catherine Campbell is a 57 y.o. female presents for shoulder pain.  Patient reports 2 days of intermittent posterior shoulder pain/throbbing/spasm that radiates into her left upper arm.  States it occurred after she slept on the left side.  Pain typically occurs with walking.  Denies any numbness/tingling/weakness of her upper extremities.  No neck pain.  Denies history of injuries or surgeries to the left shoulder and is.  She has been taking With minimal improvement.  No other concerns at this time.   Arm Pain    Past Medical History:  Diagnosis Date   Allergy    ASCUS with positive high risk human papillomavirus of vagina 06/2013, 12/2016   Colposcopic biopsy koilocytotic atypia.   Asthma    Hypertension    Low grade squamous intraepithelial lesion (LGSIL) 06/2014, 06/2015, 06/2016, 06/2017, 12/2017, 07/2018   positive high-risk HPV. Colposcopic biopsy LGSIL negative ECC 2015,  ECC 2017 with koilocytotic atypia changes    Patient Active Problem List   Diagnosis Date Noted   IGT (impaired glucose tolerance) 01/28/2023   Morbid obesity (HCC) 02/06/2020   Vitamin D deficiency 04/04/2019   Vitamin B12 deficiency 04/04/2019   Seasonal allergies 03/31/2018   Asthma, chronic 08/30/2013   RECTAL BLEEDING 07/30/2010   Hyperlipidemia 12/06/2009   Allergic rhinitis 06/03/2009   VAGINITIS 01/09/2008   Acute upper respiratory infection 09/12/2007   Essential hypertension 05/24/2007    Past Surgical History:  Procedure Laterality Date   BOIL UNDER ARM      OB History     Gravida  0   Para  0   Term  0   Preterm  0   AB  0   Living  0      SAB  0   IAB  0   Ectopic  0   Multiple  0   Live Births  0            Home Medications    Prior to Admission medications   Medication Sig Start  Date End Date Taking? Authorizing Provider  cyclobenzaprine (FLEXERIL) 10 MG tablet Take 1 tablet (10 mg total) by mouth 2 (two) times daily as needed for muscle spasms. 05/25/23  Yes Radford Pax, NP  albuterol (VENTOLIN HFA) 108 (90 Base) MCG/ACT inhaler  04/02/17   [provider]  atorvastatin (LIPITOR) 20 MG tablet TAKE 1 TABLET BY MOUTH EVERY DAY 04/20/23   [name], Limmie Patricia, MD  azelastine (ASTELIN) 0.1 % nasal spray INSTILL 1 PUFF IN EACH NOSTRIL TWICE A DAY NASALLY 30 DAYS 03/29/16   [provider]  cetirizine (ZYRTEC) 10 MG tablet Take 10 mg by mouth daily.    [provider]  EPIPEN 2-PAK 0.3 MG/0.3ML SOAJ injection Reported on 09/24/2015 07/24/14   [provider]  fexofenadine (ALLEGRA) 180 MG tablet Take 180 mg by mouth daily.    [provider]  fluticasone (FLONASE) 50 MCG/ACT nasal spray INSTILL 2 SPRAYS IN EACH NOSTRIL ONCE A DAY NASALLY 30 DAYS 03/29/16   [provider]  losartan (COZAAR) 50 MG tablet TAKE 2 TABLETS BY MOUTH EVERY DAY 04/20/23   [name], Limmie Patricia, MD  montelukast (SINGULAIR) 10 MG tablet Take 10 mg by mouth at bedtime.  04/30/13  [provider]  naproxen (NAPROSYN) 500 MG tablet Take 1 tablet (500 mg total) by mouth 2 (two) times daily. 05/04/22   Domenick Gong, MD  nystatin (MYCOSTATIN/NYSTOP) powder Apply 1 Application topically 2 (two) times daily. 08/10/22   Olivia Mackie, NP  nystatin-triamcinolone ointment (MYCOLOG) Apply 1 Application topically 2 (two) times daily. 12/17/22   Olivia Mackie, NP  RESTASIS 0.05 % ophthalmic emulsion INSTILL 1 DROP INTO BOTH EYES TWICE A DAY 02/25/18   [provider]  [facility] 80-4.5 MCG/ACT inhaler  02/10/18   [provider]  UNABLE TO FIND Allergy shots once a week    [provider]    Family History Family History  Problem Relation Age of Onset   Diabetes Father    Hypertension Father    Colon polyps  Father    Hypertension Mother    Cancer Mother        Pancreatic   Colon polyps Mother    Rectal cancer Neg Hx    Stomach cancer Neg Hx    Colon cancer Neg Hx     Social History Social History   Tobacco Use   Smoking status: Never   Smokeless tobacco: Never  Vaping Use   Vaping status: Never Used  Substance Use Topics   Alcohol use: No    Alcohol/week: 0.0 standard drinks of alcohol   Drug use: No     Allergies   Doxycycline, Metaxalone, Penicillins, and Tramadol-acetaminophen   Review of Systems Review of Systems  Musculoskeletal:        Left shoulder pain      Physical Exam Triage Vital Signs ED Triage Vitals [05/25/23 1604]  Encounter Vitals Group     BP (!) 144/86     Systolic BP Percentile      Diastolic BP Percentile      Pulse Rate 87     Resp 17     Temp 98.2 F (36.8 C)     Temp Source Oral     SpO2 98 %     Weight      Height      Head Circumference      Peak Flow      Pain Score 6     Pain Loc      Pain Education      Exclude from Growth Chart    No data found.  Updated Vital Signs BP (!) 144/86 (BP Location: Right Arm)   Pulse 87   Temp 98.2 F (36.8 C) (Oral)   Resp 17   LMP 04/05/2020   SpO2 98%   Visual Acuity Right Eye Distance:   Left Eye Distance:   Bilateral Distance:    Right Eye Near:   Left Eye Near:    Bilateral Near:     Physical Exam Vitals and nursing note reviewed.  Constitutional:      General: She is not in acute distress.    Appearance: Normal appearance. She is not ill-appearing.  HENT:     Head: Normocephalic and atraumatic.  Eyes:     Pupils: Pupils are equal, round, and reactive to light.  Cardiovascular:     Rate and Rhythm: Normal rate.  Pulmonary:     Effort: Pulmonary effort is normal.  Musculoskeletal:     Left shoulder: Tenderness present. No swelling, deformity, effusion, laceration, bony tenderness or crepitus. Normal range of motion. Normal strength. Normal pulse.       Arms:      Comments:  Tenderness to palpation along the left trapezius that extends into the left rhomboid.  Full range of motion of neck and shoulder without restriction.  Negative Hawking's test.  Strength 5 out of 5 bilateral upper extremities.  Skin:    General: Skin is warm and dry.  Neurological:     General: No focal deficit present.     Mental Status: She is alert and oriented to person, place, and time.  Psychiatric:        Mood and Affect: Mood normal.        Behavior: Behavior normal.      UC Treatments / Results  Labs (all labs ordered are listed, but only abnormal results are displayed) Labs Reviewed - No data to display  EKG   Radiology No results found.  Procedures Procedures (including critical care time)  Medications Ordered in UC Medications  ketorolac (TORADOL) 30 MG/ML injection 30 mg (30 mg Intramuscular Given 05/25/23 1638)    Initial Impression / Assessment and Plan / UC Course  I have reviewed the triage vital signs and the nursing notes.  Pertinent labs & imaging results that were available during my care of the patient were reviewed by me and considered in my medical decision making (see chart for details).     Reviewed exam and symptoms with patient.  No red flags.  Discussed trapezius muscle spasm.  Toradol injection provided in clinic.  She was monitored for 10 minutes after injection with no reaction noted and tolerated well.  Instructed no NSAIDs for 24 hours and verbalized understanding.  Trial of Flexeril, side effect profile reviewed.  Heat to the area and rest.  PCP follow-up if symptoms do not improve.  ER precautions reviewed. Final Clinical Impressions(s) / UC Diagnoses   Final diagnoses:  Spasm of left trapezius muscle     Discharge Instructions      You were given a Toradol injection in clinic today. Do not take any over the counter NSAID's such as Advil, ibuprofen, Aleve, or naproxen for 24 hours. You may take tylenol if needed.  You may  take Flexeril as needed.  Please note this medication can make you drowsy.  Do not drink alcohol or drive on this medication.  Heat to the shoulder as needed and rest.  Please follow-up with your PCP if your symptoms do not improve.  Please go to the emergency room if you develop any worsening symptoms.  I hope you feel better soon!      ED Prescriptions     Medication Sig Dispense Auth. Provider   cyclobenzaprine (FLEXERIL) 10 MG tablet Take 1 tablet (10 mg total) by mouth 2 (two) times daily as needed for muscle spasms. 10 tablet Radford Pax, NP      PDMP not reviewed this encounter.   Radford Pax, NP 05/25/23 1655

## 2023-05-26 DIAGNOSIS — J3089 Other allergic rhinitis: Secondary | ICD-10-CM | POA: Diagnosis not present

## 2023-05-27 DIAGNOSIS — J301 Allergic rhinitis due to pollen: Secondary | ICD-10-CM | POA: Diagnosis not present

## 2023-05-27 DIAGNOSIS — J3089 Other allergic rhinitis: Secondary | ICD-10-CM | POA: Diagnosis not present

## 2023-06-02 DIAGNOSIS — J3081 Allergic rhinitis due to animal (cat) (dog) hair and dander: Secondary | ICD-10-CM | POA: Diagnosis not present

## 2023-06-02 DIAGNOSIS — J301 Allergic rhinitis due to pollen: Secondary | ICD-10-CM | POA: Diagnosis not present

## 2023-06-02 DIAGNOSIS — J3089 Other allergic rhinitis: Secondary | ICD-10-CM | POA: Diagnosis not present

## 2023-06-07 ENCOUNTER — Encounter: Payer: Self-pay | Admitting: Emergency Medicine

## 2023-06-07 ENCOUNTER — Telehealth: Payer: Self-pay | Admitting: Emergency Medicine

## 2023-06-07 ENCOUNTER — Ambulatory Visit
Admission: EM | Admit: 2023-06-07 | Discharge: 2023-06-07 | Disposition: A | Discharge status: Home / Self Care | Payer: Federal, State, Local not specified - PPO | Attending: Emergency Medicine | Admitting: Emergency Medicine

## 2023-06-07 DIAGNOSIS — M25512 Pain in left shoulder: Secondary | ICD-10-CM

## 2023-06-07 MED ORDER — METHOCARBAMOL 500 MG PO TABS: 500.0000 mg | ORAL_TABLET | Freq: Three times a day (TID) | ORAL | 0 refills | Status: DC | PRN

## 2023-06-07 MED ORDER — METHYLPREDNISOLONE ACETATE 40 MG/ML IJ SUSP
40.0000 mg | Freq: Once | INTRAMUSCULAR | Status: AC
  Administered 2023-06-07: 40 mg via INTRAMUSCULAR

## 2023-06-07 NOTE — Discharge Instructions (Addendum)
The steroid shot should start to work in about 30-45 minutes to reduce pain and inflammation of the shoulder   Try the robaxin (muscle relaxer) 2-3 times daily. If this makes you drowsy, take only one before bed  Please contact orthopedics for follow up!

## 2023-06-07 NOTE — ED Triage Notes (Signed)
Pt reports she was seen here 10/1 for left shoulder pain. Reports still not better. Taken muscle relaxer, OTC tylenol, did heat. Pt reports can't sleep at night due to pain.

## 2023-06-07 NOTE — ED Provider Notes (Signed)
[facility]    CSN: [id number] Arrival date & time: 06/07/23  1721      History   Chief Complaint Chief Complaint  Patient presents with   Shoulder Pain    HPI Catherine Campbell is a 57 y.o. female.  2 week history of left shoulder/neck pain Was seen at onset and given toradol IM, flexeril. Temporarily helpful. She does not know of any injury or trauma to the area. No falls. Believes she slept oddly on it.  Not having pain radiate down the arm. No paresthesias. Denies weakness  Has prior right shoulder soft tissue injury but wasn't as bad as this Saw [facility] for that last year  Past Medical History:  Diagnosis Date   Allergy    ASCUS with positive high risk human papillomavirus of vagina 06/2013, 12/2016   Colposcopic biopsy koilocytotic atypia.   Asthma    Hypertension    Low grade squamous intraepithelial lesion (LGSIL) 06/2014, 06/2015, 06/2016, 06/2017, 12/2017, 07/2018   positive high-risk HPV. Colposcopic biopsy LGSIL negative ECC 2015,  ECC 2017 with koilocytotic atypia changes    Patient Active Problem List   Diagnosis Date Noted   IGT (impaired glucose tolerance) 01/28/2023   Morbid obesity (HCC) 02/06/2020   Vitamin D deficiency 04/04/2019   Vitamin B12 deficiency 04/04/2019   Seasonal allergies 03/31/2018   Asthma, chronic 08/30/2013   RECTAL BLEEDING 07/30/2010   Hyperlipidemia 12/06/2009   Allergic rhinitis 06/03/2009   VAGINITIS 01/09/2008   Acute upper respiratory infection 09/12/2007   Essential hypertension 05/24/2007    Past Surgical History:  Procedure Laterality Date   BOIL UNDER ARM      OB History     Gravida  0   Para  0   Term  0   Preterm  0   AB  0   Living  0      SAB  0   IAB  0   Ectopic  0   Multiple  0   Live Births  0            Home Medications    Prior to Admission medications   Medication Sig Start Date End Date Taking? Authorizing Provider  methocarbamol (ROBAXIN) 500 MG  tablet Take 1 tablet (500 mg total) by mouth 3 (three) times daily as needed for muscle spasms. 06/07/23  Yes Ylonda Storr, Lurena Joiner, PA-C  albuterol (VENTOLIN HFA) 108 (90 Base) MCG/ACT inhaler  04/02/17   [provider]  atorvastatin (LIPITOR) 20 MG tablet TAKE 1 TABLET BY MOUTH EVERY DAY 04/20/23   [name], Limmie Patricia, MD  azelastine (ASTELIN) 0.1 % nasal spray INSTILL 1 PUFF IN EACH NOSTRIL TWICE A DAY NASALLY 30 DAYS 03/29/16   [provider]  cetirizine (ZYRTEC) 10 MG tablet Take 10 mg by mouth daily.    [provider]  EPIPEN 2-PAK 0.3 MG/0.3ML SOAJ injection Reported on 09/24/2015 07/24/14   [provider]  fexofenadine (ALLEGRA) 180 MG tablet Take 180 mg by mouth daily.    [provider]  fluticasone (FLONASE) 50 MCG/ACT nasal spray INSTILL 2 SPRAYS IN EACH NOSTRIL ONCE A DAY NASALLY 30 DAYS 03/29/16   [provider]  losartan (COZAAR) 50 MG tablet TAKE 2 TABLETS BY MOUTH EVERY DAY 04/20/23   [name], Limmie Patricia, MD  nystatin-triamcinolone ointment [facility]) Apply 1 Application topically 2 (two) times daily. 12/17/22   Wyline Beady A, NP  RESTASIS 0.05 % ophthalmic emulsion INSTILL 1 DROP INTO BOTH  EYES TWICE A DAY 02/25/18   [provider]  [facility] 80-4.5 MCG/ACT inhaler  02/10/18   [provider]    Family History Family History  Problem Relation Age of Onset   Diabetes Father    Hypertension Father    Colon polyps Father    Hypertension Mother    Cancer Mother        Pancreatic   Colon polyps Mother    Rectal cancer Neg Hx    Stomach cancer Neg Hx    Colon cancer Neg Hx     Social History Social History   Tobacco Use   Smoking status: Never   Smokeless tobacco: Never  Vaping Use   Vaping status: Never Used  Substance Use Topics   Alcohol use: No    Alcohol/week: 0.0 standard drinks of alcohol   Drug use: No     Allergies   Doxycycline, Metaxalone, Penicillins, and  Tramadol-acetaminophen   Review of Systems Review of Systems Per HPI  Physical Exam Triage Vital Signs ED Triage Vitals [06/07/23 1751]  Encounter Vitals Group     BP (!) 145/87     Systolic BP Percentile      Diastolic BP Percentile      Pulse Rate 98     Resp 18     Temp 98.5 F (36.9 C)     Temp Source Oral     SpO2 98 %     Weight      Height      Head Circumference      Peak Flow      Pain Score 9     Pain Loc      Pain Education      Exclude from Growth Chart    No data found.  Updated Vital Signs BP (!) 145/87 (BP Location: Right Arm)   Pulse 98   Temp 98.5 F (36.9 C) (Oral)   Resp 18   LMP 04/05/2020   SpO2 98%    Physical Exam Vitals and nursing note reviewed.  Constitutional:      General: She is not in acute distress. Cardiovascular:     Rate and Rhythm: Normal rate and regular rhythm.     Pulses: Normal pulses.     Heart sounds: Normal heart sounds.  Pulmonary:     Effort: Pulmonary effort is normal.     Breath sounds: Normal breath sounds.  Musculoskeletal:     Left shoulder: Tenderness present. No swelling, deformity, bony tenderness or crepitus. Normal range of motion. Normal strength. Normal pulse.     Cervical back: Normal range of motion.     Comments: No obvious swelling or deformity left shoulder. Good ROM. Some muscular tenderness left trap. No spinal tenderness. Grip strength intact, radial pulse 2+, sensation normal   Skin:    Capillary Refill: Capillary refill takes less than 2 seconds.     Comments: No rash, lesions, bruising   Neurological:     Mental Status: She is alert and oriented to person, place, and time.     UC Treatments / Results  Labs (all labs ordered are listed, but only abnormal results are displayed) Labs Reviewed - No data to display  EKG   Radiology No results found.  Procedures Procedures (including critical care time)  Medications Ordered in UC Medications  methylPREDNISolone acetate  (DEPO-MEDROL) injection 40 mg (has no administration in time range)    Initial Impression / Assessment and Plan / UC Course  I have  reviewed the triage vital signs and the nursing notes.  Pertinent labs & imaging results that were available during my care of the patient were reviewed by me and considered in my medical decision making (see chart for details).  Pain appears to be muscular, soft tissue. There is no bony tenderness, reduced ROM, or history of trauma to indicate xray imaging at this time. She would benefit from orthopedic evaluation. Will try IM steroid today for pain/inflammation, switch to robaxin 2-3 times daily with drowsy precautions. Provided with two ortho clinics, advised call in the morning. Patient is agreeable to plan, no questions at this time  Final Clinical Impressions(s) / UC Diagnoses   Final diagnoses:  Acute pain of left shoulder     Discharge Instructions      The steroid shot should start to work in about 30-45 minutes to reduce pain and inflammation of the shoulder   Try the robaxin (muscle relaxer) 2-3 times daily. If this makes you drowsy, take only one before bed  Please contact orthopedics for follow up!    ED Prescriptions     Medication Sig Dispense Auth. Provider   methocarbamol (ROBAXIN) 500 MG tablet Take 1 tablet (500 mg total) by mouth 3 (three) times daily as needed for muscle spasms. 25 tablet Donnis Pecha, Lurena Joiner, PA-C      PDMP not reviewed this encounter.   Marlow Baars, [place] 06/07/23 [phone]

## 2023-06-10 ENCOUNTER — Other Ambulatory Visit: Payer: Self-pay

## 2023-06-10 ENCOUNTER — Ambulatory Visit: Payer: Federal, State, Local not specified - PPO | Admitting: Family Medicine

## 2023-06-10 ENCOUNTER — Encounter: Payer: Self-pay | Admitting: Family Medicine

## 2023-06-10 VITALS — BP 142/80 | HR 111 | Ht 65.0 in | Wt 245.0 lb

## 2023-06-10 DIAGNOSIS — J3081 Allergic rhinitis due to animal (cat) (dog) hair and dander: Secondary | ICD-10-CM | POA: Diagnosis not present

## 2023-06-10 DIAGNOSIS — J3089 Other allergic rhinitis: Secondary | ICD-10-CM | POA: Diagnosis not present

## 2023-06-10 DIAGNOSIS — M25512 Pain in left shoulder: Secondary | ICD-10-CM

## 2023-06-10 DIAGNOSIS — J301 Allergic rhinitis due to pollen: Secondary | ICD-10-CM | POA: Diagnosis not present
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
## 2023-06-10 NOTE — Patient Instructions (Signed)
Thank you for coming in today.   Call or go to the ER if you develop a large red swollen joint with extreme pain or oozing puss.    Let me know how this goes.   We can add PT with a phone call or mychart message.

## 2023-06-17 DIAGNOSIS — J3089 Other allergic rhinitis: Secondary | ICD-10-CM | POA: Diagnosis not present

## 2023-06-17 DIAGNOSIS — J3081 Allergic rhinitis due to animal (cat) (dog) hair and dander: Secondary | ICD-10-CM | POA: Diagnosis not present

## 2023-06-17 DIAGNOSIS — J301 Allergic rhinitis due to pollen: Secondary | ICD-10-CM | POA: Diagnosis not present

## 2023-06-22 DIAGNOSIS — H40023 Open angle with borderline findings, high risk, bilateral: Secondary | ICD-10-CM | POA: Diagnosis not present

## 2023-06-22 DIAGNOSIS — H10413 Chronic giant papillary conjunctivitis, bilateral: Secondary | ICD-10-CM | POA: Diagnosis not present

## 2023-06-22 DIAGNOSIS — H5213 Myopia, bilateral: Secondary | ICD-10-CM | POA: Diagnosis not present

## 2023-06-24 DIAGNOSIS — J3089 Other allergic rhinitis: Secondary | ICD-10-CM | POA: Diagnosis not present

## 2023-06-24 DIAGNOSIS — J301 Allergic rhinitis due to pollen: Secondary | ICD-10-CM | POA: Diagnosis not present

## 2023-07-01 DIAGNOSIS — J3081 Allergic rhinitis due to animal (cat) (dog) hair and dander: Secondary | ICD-10-CM | POA: Diagnosis not present

## 2023-07-01 DIAGNOSIS — J301 Allergic rhinitis due to pollen: Secondary | ICD-10-CM | POA: Diagnosis not present

## 2023-07-01 DIAGNOSIS — J3089 Other allergic rhinitis: Secondary | ICD-10-CM | POA: Diagnosis not present

## 2023-07-15 DIAGNOSIS — J301 Allergic rhinitis due to pollen: Secondary | ICD-10-CM | POA: Diagnosis not present

## 2023-07-15 DIAGNOSIS — J3089 Other allergic rhinitis: Secondary | ICD-10-CM | POA: Diagnosis not present

## 2023-07-21 DIAGNOSIS — J3089 Other allergic rhinitis: Secondary | ICD-10-CM | POA: Diagnosis not present

## 2023-07-21 DIAGNOSIS — J301 Allergic rhinitis due to pollen: Secondary | ICD-10-CM | POA: Diagnosis not present

## 2023-07-28 DIAGNOSIS — J3089 Other allergic rhinitis: Secondary | ICD-10-CM | POA: Diagnosis not present

## 2023-07-28 DIAGNOSIS — J301 Allergic rhinitis due to pollen: Secondary | ICD-10-CM | POA: Diagnosis not present

## 2023-07-28 DIAGNOSIS — J3081 Allergic rhinitis due to animal (cat) (dog) hair and dander: Secondary | ICD-10-CM | POA: Diagnosis not present

## 2023-07-29 ENCOUNTER — Ambulatory Visit: Payer: Federal, State, Local not specified - PPO | Admitting: Internal Medicine

## 2023-08-11 DIAGNOSIS — J301 Allergic rhinitis due to pollen: Secondary | ICD-10-CM | POA: Diagnosis not present

## 2023-08-11 DIAGNOSIS — J3089 Other allergic rhinitis: Secondary | ICD-10-CM | POA: Diagnosis not present

## 2023-08-24 ENCOUNTER — Ambulatory Visit: Payer: Federal, State, Local not specified - PPO | Admitting: Internal Medicine

## 2023-08-24 DIAGNOSIS — J0100 Acute maxillary sinusitis, unspecified: Secondary | ICD-10-CM | POA: Diagnosis not present

## 2023-08-24 DIAGNOSIS — R0981 Nasal congestion: Secondary | ICD-10-CM | POA: Diagnosis not present

## 2023-08-24 DIAGNOSIS — Z20822 Contact with and (suspected) exposure to covid-19: Secondary | ICD-10-CM | POA: Diagnosis not present

## 2023-08-30 ENCOUNTER — Encounter: Payer: Self-pay | Admitting: Internal Medicine

## 2023-08-30 ENCOUNTER — Ambulatory Visit (INDEPENDENT_AMBULATORY_CARE_PROVIDER_SITE_OTHER): Payer: BC Managed Care – PPO | Admitting: Internal Medicine

## 2023-08-30 VITALS — BP 130/84 | HR 92 | Temp 97.9°F | Wt 253.1 lb

## 2023-08-30 DIAGNOSIS — E782 Mixed hyperlipidemia: Secondary | ICD-10-CM

## 2023-08-30 DIAGNOSIS — E538 Deficiency of other specified B group vitamins: Secondary | ICD-10-CM

## 2023-08-30 DIAGNOSIS — I10 Essential (primary) hypertension: Secondary | ICD-10-CM

## 2023-08-30 DIAGNOSIS — J3089 Other allergic rhinitis: Secondary | ICD-10-CM | POA: Diagnosis not present

## 2023-08-30 DIAGNOSIS — J3081 Allergic rhinitis due to animal (cat) (dog) hair and dander: Secondary | ICD-10-CM | POA: Diagnosis not present

## 2023-08-30 DIAGNOSIS — R7302 Impaired glucose tolerance (oral): Secondary | ICD-10-CM

## 2023-08-30 DIAGNOSIS — J301 Allergic rhinitis due to pollen: Secondary | ICD-10-CM | POA: Diagnosis not present

## 2023-08-30 LAB — POCT GLYCOSYLATED HEMOGLOBIN (HGB A1C): Hemoglobin A1C: 5.7 % — AB (ref 4.0–5.6)

## 2023-08-30 MED ORDER — CYANOCOBALAMIN 1000 MCG/ML IJ SOLN
INTRAMUSCULAR | 11 refills | Status: DC
Start: 2023-08-30 — End: 2024-07-05

## 2023-08-30 MED ORDER — CYANOCOBALAMIN 1000 MCG/ML IJ SOLN
1000.0000 ug | Freq: Once | INTRAMUSCULAR | Status: AC
Start: 2023-08-30 — End: 2023-08-30
  Administered 2023-08-30: 1000 ug via INTRAMUSCULAR

## 2023-08-30 NOTE — Assessment & Plan Note (Signed)
 For IM B12 today.

## 2023-08-30 NOTE — Progress Notes (Signed)
 Established Patient Office Visit     CC/Reason for Visit: 29-month follow-up chronic conditions  HPI: Catherine Campbell is a 58 y.o. female who is coming in today for the above mentioned reasons. Past Medical History is significant for: Morbid obesity, hypertension, hyperlipidemia, impaired glucose tolerance, vitamin D  and B12 deficiencies.  She is recovering from a URI but other than this has been well.  Is due for B12 injection today.   Past Medical/Surgical History: Past Medical History:  Diagnosis Date   Allergy    ASCUS with positive high risk human papillomavirus of vagina 06/2013, 12/2016   Colposcopic biopsy koilocytotic atypia.   Asthma    Hypertension    Low grade squamous intraepithelial lesion (LGSIL) 06/2014, 06/2015, 06/2016, 06/2017, 12/2017, 07/2018   positive high-risk HPV. Colposcopic biopsy LGSIL negative ECC 2015,  ECC 2017 with koilocytotic atypia changes    Past Surgical History:  Procedure Laterality Date   BOIL UNDER ARM      Social History:  reports that she has never smoked. She has never used smokeless tobacco. She reports that she does not drink alcohol and does not use drugs.  Allergies: Allergies  Allergen Reactions   Doxycycline  Hives   Metaxalone Hives   Penicillins Hives   Tramadol-Acetaminophen Hives    Family History:  Family History  Problem Relation Age of Onset   Diabetes Father    Hypertension Father    Colon polyps Father    Hypertension Mother    Cancer Mother        Pancreatic   Colon polyps Mother    Rectal cancer Neg Hx    Stomach cancer Neg Hx    Colon cancer Neg Hx      Current Outpatient Medications:    albuterol (VENTOLIN HFA) 108 (90 Base) MCG/ACT inhaler, , Disp: , Rfl:    atorvastatin  (LIPITOR) 20 MG tablet, TAKE 1 TABLET BY MOUTH EVERY DAY, Disp: 90 tablet, Rfl: 1   azelastine (ASTELIN) 0.1 % nasal spray, INSTILL 1 PUFF IN EACH NOSTRIL TWICE A DAY NASALLY 30 DAYS, Disp: , Rfl: 3   cetirizine (ZYRTEC) 10  MG tablet, Take 10 mg by mouth daily., Disp: , Rfl:    EPIPEN 2-PAK 0.3 MG/0.3ML SOAJ injection, Reported on 09/24/2015, Disp: , Rfl: 0   fexofenadine (ALLEGRA) 180 MG tablet, Take 180 mg by mouth daily., Disp: , Rfl:    fluticasone (FLONASE) 50 MCG/ACT nasal spray, INSTILL 2 SPRAYS IN EACH NOSTRIL ONCE A DAY NASALLY 30 DAYS, Disp: , Rfl: 3   losartan  (COZAAR ) 50 MG tablet, TAKE 2 TABLETS BY MOUTH EVERY DAY, Disp: 180 tablet, Rfl: 1   methocarbamol  (ROBAXIN ) 500 MG tablet, Take 1 tablet (500 mg total) by mouth 3 (three) times daily as needed for muscle spasms., Disp: 25 tablet, Rfl: 0   nystatin -triamcinolone  ointment (MYCOLOG), Apply 1 Application topically 2 (two) times daily., Disp: 30 g, Rfl: 0   RESTASIS 0.05 % ophthalmic emulsion, INSTILL 1 DROP INTO BOTH EYES TWICE A DAY, Disp: , Rfl: 3   SYMBICORT 80-4.5 MCG/ACT inhaler, , Disp: , Rfl:   Review of Systems:  Negative unless indicated in HPI.   Physical Exam: Vitals:   08/30/23 1610  BP: 130/84  Pulse: 92  Temp: 97.9 F (36.6 C)  TempSrc: Oral  SpO2: 98%  Weight: 253 lb 1.6 oz (114.8 kg)    Body mass index is 42.12 kg/m.   Physical Exam Vitals reviewed.  Constitutional:      Appearance:  Normal appearance. She is obese.  HENT:     Head: Normocephalic and atraumatic.  Eyes:     Conjunctiva/sclera: Conjunctivae normal.     Pupils: Pupils are equal, round, and reactive to light.  Cardiovascular:     Rate and Rhythm: Normal rate and regular rhythm.  Pulmonary:     Effort: Pulmonary effort is normal.     Breath sounds: Normal breath sounds.  Skin:    General: Skin is warm and dry.  Neurological:     General: No focal deficit present.     Mental Status: She is alert and oriented to person, place, and time.  Psychiatric:        Mood and Affect: Mood normal.        Behavior: Behavior normal.        Thought Content: Thought content normal.        Judgment: Judgment normal.      Impression and Plan:  IGT (impaired  glucose tolerance) Assessment & Plan: Stable with an A1c of 5.7.  Continue to work on lifestyle changes.  Orders: -     POCT glycosylated hemoglobin (Hb A1C)  Mixed hyperlipidemia Assessment & Plan: On atorvastatin  20 mg.   Essential hypertension Assessment & Plan: Well-controlled on current.   Morbid obesity (HCC) Assessment & Plan: -Discussed healthy lifestyle, including increased physical activity and better food choices to promote weight loss.    Vitamin B12 deficiency Assessment & Plan: For IM B12 today.      Time spent:32 minutes reviewing chart, interviewing and examining patient and formulating plan of care.     Tully Theophilus Andrews, MD [facility]

## 2023-08-30 NOTE — Assessment & Plan Note (Signed)
 -  On atorvastatin 20 mg

## 2023-08-30 NOTE — Addendum Note (Signed)
 Addended by: Kern Reap B on: 08/30/2023 04:54 PM   Modules accepted: Orders

## 2023-08-30 NOTE — Assessment & Plan Note (Signed)
 Discussed healthy lifestyle, including increased physical activity and better food choices to promote weight loss.

## 2023-08-30 NOTE — Assessment & Plan Note (Signed)
 Stable with an A1c of 5.7.  Continue to work on lifestyle changes.

## 2023-08-30 NOTE — Assessment & Plan Note (Signed)
 Well-controlled on current.

## 2023-08-31 ENCOUNTER — Encounter: Payer: Self-pay | Admitting: Internal Medicine

## 2023-09-06 DIAGNOSIS — J3081 Allergic rhinitis due to animal (cat) (dog) hair and dander: Secondary | ICD-10-CM | POA: Diagnosis not present

## 2023-09-06 DIAGNOSIS — J301 Allergic rhinitis due to pollen: Secondary | ICD-10-CM | POA: Diagnosis not present

## 2023-09-06 DIAGNOSIS — J3089 Other allergic rhinitis: Secondary | ICD-10-CM | POA: Diagnosis not present

## 2023-09-13 DIAGNOSIS — H1045 Other chronic allergic conjunctivitis: Secondary | ICD-10-CM | POA: Diagnosis not present

## 2023-09-13 DIAGNOSIS — J3089 Other allergic rhinitis: Secondary | ICD-10-CM | POA: Diagnosis not present

## 2023-09-13 DIAGNOSIS — J301 Allergic rhinitis due to pollen: Secondary | ICD-10-CM | POA: Diagnosis not present

## 2023-09-13 DIAGNOSIS — J4540 Moderate persistent asthma, uncomplicated: Secondary | ICD-10-CM | POA: Diagnosis not present

## 2023-09-14 ENCOUNTER — Encounter: Payer: Self-pay | Admitting: Nurse Practitioner

## 2023-09-14 ENCOUNTER — Ambulatory Visit
Admission: RE | Admit: 2023-09-14 | Discharge: 2023-09-14 | Disposition: A | Discharge status: Home / Self Care | Payer: Federal, State, Local not specified - PPO | Source: Ambulatory Visit | Attending: Allergy and Immunology | Admitting: Allergy and Immunology

## 2023-09-14 ENCOUNTER — Other Ambulatory Visit (HOSPITAL_COMMUNITY)
Admission: RE | Admit: 2023-09-14 | Discharge: 2023-09-14 | Disposition: A | Discharge status: Home / Self Care | Payer: Federal, State, Local not specified - PPO | Source: Ambulatory Visit | Attending: Nurse Practitioner | Admitting: Nurse Practitioner

## 2023-09-14 ENCOUNTER — Ambulatory Visit (INDEPENDENT_AMBULATORY_CARE_PROVIDER_SITE_OTHER): Payer: Federal, State, Local not specified - PPO | Admitting: Nurse Practitioner

## 2023-09-14 ENCOUNTER — Other Ambulatory Visit: Payer: Self-pay | Admitting: Allergy and Immunology

## 2023-09-14 VITALS — BP 118/80 | HR 106 | Ht 65.0 in | Wt 249.0 lb

## 2023-09-14 DIAGNOSIS — R059 Cough, unspecified: Secondary | ICD-10-CM | POA: Diagnosis not present

## 2023-09-14 DIAGNOSIS — Z124 Encounter for screening for malignant neoplasm of cervix: Secondary | ICD-10-CM | POA: Insufficient documentation

## 2023-09-14 DIAGNOSIS — Z01419 Encounter for gynecological examination (general) (routine) without abnormal findings: Secondary | ICD-10-CM | POA: Diagnosis not present

## 2023-09-14 DIAGNOSIS — Z780 Asymptomatic menopausal state: Secondary | ICD-10-CM

## 2023-09-14 DIAGNOSIS — B372 Candidiasis of skin and nail: Secondary | ICD-10-CM

## 2023-09-14 DIAGNOSIS — R052 Subacute cough: Secondary | ICD-10-CM

## 2023-09-14 MED ORDER — NYSTATIN-TRIAMCINOLONE 100000-0.1 UNIT/GM-% EX OINT
1.0000 | TOPICAL_OINTMENT | Freq: Two times a day (BID) | CUTANEOUS | 1 refills | Status: DC
Start: 2023-09-14 — End: 2024-05-02

## 2023-09-14 NOTE — Progress Notes (Signed)
Catherine Campbell [birth year] [id number]   History:  58 y.o. G0 presents for annual exam. Postmenopausal - no HRT, no bleeding. See pap history below. HTN, HLD managed by PCP. Uses mycology ointment occasionally for groin irritation. Needs refill. Being treated for bronchitis.   History of persistent LGSIL 2015-2019  2020 Negative colposcopy and biopsy 2021 Normal neg HPV 2021 07/2021 ASCUS neg HPV 07/2022 ASCUS + HR HPV, neg biopsies  Gynecologic History Patient's last menstrual period was 04/05/2020.   Contraception/Family planning: abstinence Sexually active: No  Health Maintenance Last Pap: 08/10/2022. Results were: ASCUS + HR HPV Last mammogram: 11/17/2022. Results were: Normal Last colonoscopy: 05/26/2018. Results were: Normal, 10-year recall Last Dexa: Not indicated  Past medical history, past surgical history, family history and social history were all reviewed and documented in the EPIC chart. 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   ROS:  A ROS was performed and pertinent positives and negatives are included.  Exam:  Vitals:   09/14/23 1437  BP: 118/80  Pulse: (!) 106  SpO2: 98%  Weight: 249 lb (112.9 kg)  Height: 5\' 5"  (1.651 m)     Body mass index is 41.44 kg/m.  General appearance:  Normal Thyroid:  Symmetrical, normal in size, without palpable masses or nodularity. Respiratory  Auscultation:  Clear without wheezing or rhonchi Cardiovascular  Auscultation:  Regular rate, without rubs, murmurs or gallops  Edema/varicosities:  Not grossly evident Abdominal  Soft,nontender, without masses, guarding or rebound.  Liver/spleen:  No organomegaly noted  Hernia:  None appreciated  Skin  Inspection:  Rash under breasts and in bilateral groin c/w yeast Breasts: Examined lying and sitting.   Right: Without masses, retractions, nipple discharge or axillary adenopathy.   Left: Without masses, retractions, nipple discharge or axillary  adenopathy. Pelvic: External genitalia:  no lesions              Urethra:  normal appearing urethra with no masses, tenderness or lesions              Bartholins and Skenes: normal                 Vagina: normal appearing vagina with normal color and discharge, no lesions              Cervix: no lesions Bimanual Exam:  Uterus:  no masses or tenderness              Adnexa: no mass, fullness, tenderness              Rectovaginal: Deferred              Anus:  normal, no lesions  Patient informed chaperone available to be present for breast and pelvic exam. Patient has requested no chaperone to be present. Patient has been advised what will be completed during breast and pelvic exam.   Assessment/Plan:  58 y.o. G0 for annual exam.   Well female exam with routine gynecological exam - Education provided on SBEs, importance of preventative screenings, current guidelines, high calcium diet, regular exercise, and multivitamin daily.  Labs with PCP.  Postmenopausal - no HRT, no bleeding  Cervical cancer screening - Plan: Cytology - PAP( [place]). See HPI for abnormal pap history. Pap today per guidelines.   Skin yeast infection - Plan: nystatin-triamcinolone ointment (MYCOLOG) BID as needed to groin.   Screening for breast cancer - Normal mammogram history.  Continue annual screenings.  Normal breast exam today.  Screening for colon cancer -  2019 colonoscopy. Will repeat at GI's recommended interval.   Return in about 1 year (around 09/13/2024) for Annual.    Olivia Mackie DNP, 2:58 PM 09/14/2023

## 2023-09-20 LAB — CYTOLOGY - PAP
Comment: NEGATIVE
Diagnosis: UNDETERMINED — AB
High risk HPV: POSITIVE — AB

## 2023-09-21 ENCOUNTER — Encounter: Payer: Self-pay | Admitting: Nurse Practitioner

## 2023-09-23 DIAGNOSIS — J301 Allergic rhinitis due to pollen: Secondary | ICD-10-CM | POA: Diagnosis not present

## 2023-09-23 DIAGNOSIS — J3089 Other allergic rhinitis: Secondary | ICD-10-CM | POA: Diagnosis not present

## 2023-09-23 DIAGNOSIS — J3081 Allergic rhinitis due to animal (cat) (dog) hair and dander: Secondary | ICD-10-CM | POA: Diagnosis not present

## 2023-09-30 DIAGNOSIS — J3081 Allergic rhinitis due to animal (cat) (dog) hair and dander: Secondary | ICD-10-CM | POA: Diagnosis not present

## 2023-09-30 DIAGNOSIS — J3089 Other allergic rhinitis: Secondary | ICD-10-CM | POA: Diagnosis not present

## 2023-09-30 DIAGNOSIS — J301 Allergic rhinitis due to pollen: Secondary | ICD-10-CM | POA: Diagnosis not present

## 2023-10-08 DIAGNOSIS — J301 Allergic rhinitis due to pollen: Secondary | ICD-10-CM | POA: Diagnosis not present

## 2023-10-08 DIAGNOSIS — J3089 Other allergic rhinitis: Secondary | ICD-10-CM | POA: Diagnosis not present

## 2023-10-08 DIAGNOSIS — J3081 Allergic rhinitis due to animal (cat) (dog) hair and dander: Secondary | ICD-10-CM | POA: Diagnosis not present

## 2023-10-14 ENCOUNTER — Other Ambulatory Visit: Payer: Self-pay | Admitting: Internal Medicine

## 2023-10-14 DIAGNOSIS — E785 Hyperlipidemia, unspecified: Secondary | ICD-10-CM

## 2023-10-14 DIAGNOSIS — I10 Essential (primary) hypertension: Secondary | ICD-10-CM

## 2023-10-19 DIAGNOSIS — J301 Allergic rhinitis due to pollen: Secondary | ICD-10-CM | POA: Diagnosis not present

## 2023-10-19 DIAGNOSIS — J3089 Other allergic rhinitis: Secondary | ICD-10-CM | POA: Diagnosis not present

## 2023-10-27 DIAGNOSIS — J301 Allergic rhinitis due to pollen: Secondary | ICD-10-CM | POA: Diagnosis not present

## 2023-10-27 DIAGNOSIS — J3081 Allergic rhinitis due to animal (cat) (dog) hair and dander: Secondary | ICD-10-CM | POA: Diagnosis not present

## 2023-10-27 DIAGNOSIS — J3089 Other allergic rhinitis: Secondary | ICD-10-CM | POA: Diagnosis not present

## 2023-11-10 ENCOUNTER — Ambulatory Visit (INDEPENDENT_AMBULATORY_CARE_PROVIDER_SITE_OTHER): Admitting: Internal Medicine

## 2023-11-10 ENCOUNTER — Encounter: Payer: Self-pay | Admitting: Internal Medicine

## 2023-11-10 VITALS — BP 110/80 | HR 108 | Temp 98.1°F | Wt 252.5 lb

## 2023-11-10 DIAGNOSIS — J069 Acute upper respiratory infection, unspecified: Secondary | ICD-10-CM | POA: Diagnosis not present

## 2023-11-10 MED ORDER — BENZONATATE 100 MG PO CAPS
100.0000 mg | ORAL_CAPSULE | Freq: Two times a day (BID) | ORAL | 0 refills | Status: DC | PRN
Start: 2023-11-10 — End: 2024-07-05

## 2023-11-10 NOTE — Progress Notes (Signed)
 Established Patient Office Visit     CC/Reason for Visit: Cough and congestion  HPI: Catherine Campbell is a 58 y.o. female who is coming in today for the above mentioned reasons.  She has a history of asthma and seasonal allergies.  12 days ago she started experiencing cough, congestion and a hoarse voice.  Cough is nonproductive.  She has been using over-the-counter Mucinex without much relief.   Past Medical/Surgical History: Past Medical History:  Diagnosis Date   Allergy    ASCUS with positive high risk human papillomavirus of vagina 06/2013, 12/2016   Colposcopic biopsy koilocytotic atypia.   Asthma    Hypertension    Low grade squamous intraepithelial lesion (LGSIL) 06/2014, 06/2015, 06/2016, 06/2017, 12/2017, 07/2018   positive high-risk HPV. Colposcopic biopsy LGSIL negative ECC 2015,  ECC 2017 with koilocytotic atypia changes    Past Surgical History:  Procedure Laterality Date   BOIL UNDER ARM      Social History:  reports that she has never smoked. She has never used smokeless tobacco. She reports that she does not drink alcohol and does not use drugs.  Allergies: Allergies  Allergen Reactions   Doxycycline Hives   Metaxalone Hives   Penicillins Hives   Sulfamethoxazole-Trimethoprim     Other Reaction(s): dysuria   Tramadol-Acetaminophen Hives    Family History:  Family History  Problem Relation Age of Onset   Diabetes Father    Hypertension Father    Colon polyps Father    Hypertension Mother    Cancer Mother        Pancreatic   Colon polyps Mother    Rectal cancer Neg Hx    Stomach cancer Neg Hx    Colon cancer Neg Hx      Current Outpatient Medications:    albuterol (VENTOLIN HFA) 108 (90 Base) MCG/ACT inhaler, , Disp: , Rfl:    atorvastatin (LIPITOR) 20 MG tablet, TAKE 1 TABLET BY MOUTH EVERY DAY, Disp: 90 tablet, Rfl: 1   azelastine (ASTELIN) 0.1 % nasal spray, INSTILL 1 PUFF IN EACH NOSTRIL TWICE A DAY NASALLY 30 DAYS, Disp: , Rfl: 3    cetirizine (ZYRTEC) 10 MG tablet, Take 10 mg by mouth daily., Disp: , Rfl:    cyanocobalamin (VITAMIN B12) 1000 MCG/ML injection, Inject 1 ml into the muscle once a week for a month.  Then injected 1 ml once a month thereafter, Disp: 6 mL, Rfl: 11   EPIPEN 2-PAK 0.3 MG/0.3ML SOAJ injection, Reported on 09/24/2015, Disp: , Rfl: 0   fexofenadine (ALLEGRA) 180 MG tablet, Take 180 mg by mouth daily., Disp: , Rfl:    fluticasone (FLONASE) 50 MCG/ACT nasal spray, INSTILL 2 SPRAYS IN EACH NOSTRIL ONCE A DAY NASALLY 30 DAYS, Disp: , Rfl: 3   losartan (COZAAR) 50 MG tablet, TAKE 2 TABLETS BY MOUTH EVERY DAY, Disp: 180 tablet, Rfl: 1   nystatin-triamcinolone ointment (MYCOLOG), Apply 1 Application topically 2 (two) times daily., Disp: 30 g, Rfl: 1   RESTASIS 0.05 % ophthalmic emulsion, INSTILL 1 DROP INTO BOTH EYES TWICE A DAY, Disp: , Rfl: 3   SINGULAIR 10 MG tablet, 1 tablet in the evening Orally Once a day for 30 days, Disp: , Rfl:    SYMBICORT 80-4.5 MCG/ACT inhaler, , Disp: , Rfl:    benzonatate (TESSALON) 100 MG capsule, Take 1 capsule (100 mg total) by mouth 2 (two) times daily as needed for cough., Disp: 20 capsule, Rfl: 0  Review of Systems:  Negative  unless indicated in HPI.   Physical Exam: Vitals:   11/10/23 0911  BP: 110/80  Pulse: (!) 108  Temp: 98.1 F (36.7 C)  TempSrc: Oral  SpO2: 98%  Weight: 252 lb 8 oz (114.5 kg)    Body mass index is 42.02 kg/m.   Physical Exam Vitals reviewed.  Constitutional:      Appearance: Normal appearance.  HENT:     Mouth/Throat:     Mouth: Mucous membranes are moist.     Pharynx: Oropharynx is clear.  Eyes:     Conjunctiva/sclera: Conjunctivae normal.  Cardiovascular:     Rate and Rhythm: Normal rate and regular rhythm.  Pulmonary:     Effort: Pulmonary effort is normal.     Breath sounds: Normal breath sounds.  Neurological:     Mental Status: She is alert.      Impression and Plan:  URI with cough and congestion -      Benzonatate; Take 1 capsule (100 mg total) by mouth 2 (two) times daily as needed for cough.  Dispense: 20 capsule; Refill: 0   -Too late in disease course to check for flu and COVID. -Will prescribe Tessalon Perles for cough. -Suspect this is a URI on top of her routine seasonal allergies. -Given exam findings, PNA, pharyngitis, ear infection are not likely, hence abx have not been prescribed. -Have advised rest, fluids, OTC antihistamines, cough suppressants and mucinex. -RTC if no improvement in 10-14 days.   Time spent:23 minutes reviewing chart, interviewing and examining patient and formulating plan of care.     Chaya Jan, MD [facility]

## 2023-11-16 DIAGNOSIS — J3081 Allergic rhinitis due to animal (cat) (dog) hair and dander: Secondary | ICD-10-CM | POA: Diagnosis not present

## 2023-11-16 DIAGNOSIS — J301 Allergic rhinitis due to pollen: Secondary | ICD-10-CM | POA: Diagnosis not present

## 2023-11-16 DIAGNOSIS — J3089 Other allergic rhinitis: Secondary | ICD-10-CM | POA: Diagnosis not present

## 2023-11-23 DIAGNOSIS — Z1231 Encounter for screening mammogram for malignant neoplasm of breast: Secondary | ICD-10-CM | POA: Diagnosis not present

## 2023-11-23 DIAGNOSIS — J301 Allergic rhinitis due to pollen: Secondary | ICD-10-CM | POA: Diagnosis not present

## 2023-11-23 DIAGNOSIS — J3089 Other allergic rhinitis: Secondary | ICD-10-CM | POA: Diagnosis not present

## 2023-11-23 DIAGNOSIS — J3081 Allergic rhinitis due to animal (cat) (dog) hair and dander: Secondary | ICD-10-CM | POA: Diagnosis not present

## 2023-11-23 LAB — HM MAMMOGRAPHY

## 2023-12-08 DIAGNOSIS — J3089 Other allergic rhinitis: Secondary | ICD-10-CM | POA: Diagnosis not present

## 2023-12-08 DIAGNOSIS — J301 Allergic rhinitis due to pollen: Secondary | ICD-10-CM | POA: Diagnosis not present

## 2023-12-08 DIAGNOSIS — J3081 Allergic rhinitis due to animal (cat) (dog) hair and dander: Secondary | ICD-10-CM | POA: Diagnosis not present

## 2023-12-23 DIAGNOSIS — J3089 Other allergic rhinitis: Secondary | ICD-10-CM | POA: Diagnosis not present

## 2023-12-23 DIAGNOSIS — J301 Allergic rhinitis due to pollen: Secondary | ICD-10-CM | POA: Diagnosis not present

## 2023-12-31 DIAGNOSIS — J301 Allergic rhinitis due to pollen: Secondary | ICD-10-CM | POA: Diagnosis not present

## 2023-12-31 DIAGNOSIS — J3081 Allergic rhinitis due to animal (cat) (dog) hair and dander: Secondary | ICD-10-CM | POA: Diagnosis not present

## 2023-12-31 DIAGNOSIS — J3089 Other allergic rhinitis: Secondary | ICD-10-CM | POA: Diagnosis not present

## 2024-01-18 DIAGNOSIS — J3081 Allergic rhinitis due to animal (cat) (dog) hair and dander: Secondary | ICD-10-CM | POA: Diagnosis not present

## 2024-01-18 DIAGNOSIS — J3089 Other allergic rhinitis: Secondary | ICD-10-CM | POA: Diagnosis not present

## 2024-01-18 DIAGNOSIS — J301 Allergic rhinitis due to pollen: Secondary | ICD-10-CM | POA: Diagnosis not present

## 2024-01-28 ENCOUNTER — Ambulatory Visit
Admission: RE | Admit: 2024-01-28 | Discharge: 2024-01-28 | Disposition: A | Discharge status: Home / Self Care | Source: Ambulatory Visit | Attending: Family Medicine | Admitting: Family Medicine

## 2024-01-28 DIAGNOSIS — J040 Acute laryngitis: Secondary | ICD-10-CM

## 2024-01-28 DIAGNOSIS — J4540 Moderate persistent asthma, uncomplicated: Secondary | ICD-10-CM

## 2024-01-28 DIAGNOSIS — J988 Other specified respiratory disorders: Secondary | ICD-10-CM

## 2024-01-28 DIAGNOSIS — J309 Allergic rhinitis, unspecified: Secondary | ICD-10-CM

## 2024-01-28 DIAGNOSIS — B9789 Other viral agents as the cause of diseases classified elsewhere: Secondary | ICD-10-CM

## 2024-01-28 MED ORDER — PREDNISONE 20 MG PO TABS: ORAL_TABLET | ORAL | 0 refills | Status: DC

## 2024-01-28 MED ORDER — PROMETHAZINE-DM 6.25-15 MG/5ML PO SYRP: 5.0000 mL | ORAL_SOLUTION | Freq: Three times a day (TID) | ORAL | 0 refills | Status: DC | PRN

## 2024-01-28 NOTE — Discharge Instructions (Signed)
 We will manage this as a viral respiratory infection. For sore throat or cough try using a honey-based tea. Use 3 teaspoons of honey with juice squeezed from half lemon. Place shaved pieces of ginger into 1/2-1 cup of water and warm over stove top. Then mix the ingredients and repeat every 4 hours as needed. Please take Tylenol 500mg -650mg  once every 6 hours for fevers, aches and pains. Hydrate very well with at least 2 liters (64 ounces) of water. Eat light meals such as soups (chicken and noodles, chicken wild rice, vegetable).  Do not eat any foods that you are allergic to.  You can take this together with prednisone  for 5 days and use cough syrup as needed. Maintain all other medications for your allergies, asthma.

## 2024-01-28 NOTE — ED Provider Notes (Signed)
 [facility]  Note:  This document was prepared using Conservation officer, historic buildings and may include unintentional dictation errors.  MRN: [id number] DOB: February 08, 1966  Subjective:   Catherine Campbell is a 58 y.o. female presenting for 6 day history of persistent sinus congestion, drainage, hoarseness of voice, left ear fullness/pain and itching. Has had a slight cough. No fever, chest pain, shob, wheezing, n/v, abdominal pain, changes to bowel or urinary habits, rashes. Has allergies, asthma. Takes Allegra, Astelin, Zyrtec, Singulair. Uses albuterol as needed, Symbicort daily. No smoking of any kind including cigarettes, cigars, vaping, marijuana use.    No current facility-administered medications for this encounter.  Current Outpatient Medications:    albuterol (VENTOLIN HFA) 108 (90 Base) MCG/ACT inhaler, , Disp: , Rfl:    atorvastatin  (LIPITOR) 20 MG tablet, TAKE 1 TABLET BY MOUTH EVERY DAY, Disp: 90 tablet, Rfl: 1   azelastine (ASTELIN) 0.1 % nasal spray, INSTILL 1 PUFF IN EACH NOSTRIL TWICE A DAY NASALLY 30 DAYS, Disp: , Rfl: 3   benzonatate  (TESSALON ) 100 MG capsule, Take 1 capsule (100 mg total) by mouth 2 (two) times daily as needed for cough., Disp: 20 capsule, Rfl: 0   cetirizine (ZYRTEC) 10 MG tablet, Take 10 mg by mouth daily., Disp: , Rfl:    cyanocobalamin  (VITAMIN B12) 1000 MCG/ML injection, Inject 1 ml into the muscle once a week for a month.  Then injected 1 ml once a month thereafter, Disp: 6 mL, Rfl: 11   EPIPEN 2-PAK 0.3 MG/0.3ML SOAJ injection, Reported on 09/24/2015, Disp: , Rfl: 0   fexofenadine (ALLEGRA) 180 MG tablet, Take 180 mg by mouth daily., Disp: , Rfl:    fluticasone (FLONASE) 50 MCG/ACT nasal spray, INSTILL 2 SPRAYS IN EACH NOSTRIL ONCE A DAY NASALLY 30 DAYS, Disp: , Rfl: 3   losartan  (COZAAR ) 50 MG tablet, TAKE 2 TABLETS BY MOUTH EVERY DAY, Disp: 180 tablet, Rfl: 1   nystatin -triamcinolone  ointment (MYCOLOG), Apply 1 Application  topically 2 (two) times daily., Disp: 30 g, Rfl: 1   RESTASIS 0.05 % ophthalmic emulsion, INSTILL 1 DROP INTO BOTH EYES TWICE A DAY, Disp: , Rfl: 3   SINGULAIR 10 MG tablet, 1 tablet in the evening Orally Once a day for 30 days, Disp: , Rfl:    SYMBICORT 80-4.5 MCG/ACT inhaler, , Disp: , Rfl:    Allergies  Allergen Reactions   Doxycycline  Hives   Metaxalone Hives   Penicillins Hives   Sulfamethoxazole -Trimethoprim     Other Reaction(s): dysuria   Tramadol-Acetaminophen Hives    Past Medical History:  Diagnosis Date   Allergy    ASCUS with positive high risk human papillomavirus of vagina 06/2013, 12/2016   Colposcopic biopsy koilocytotic atypia.   Asthma    Hypertension    Low grade squamous intraepithelial lesion (LGSIL) 06/2014, 06/2015, 06/2016, 06/2017, 12/2017, 07/2018   positive high-risk HPV. Colposcopic biopsy LGSIL negative ECC 2015,  ECC 2017 with koilocytotic atypia changes     Past Surgical History:  Procedure Laterality Date   BOIL UNDER ARM      Family History  Problem Relation Age of Onset   Diabetes Father    Hypertension Father    Colon polyps Father    Hypertension Mother    Cancer Mother        Pancreatic   Colon polyps Mother    Rectal cancer Neg Hx    Stomach cancer Neg Hx    Colon cancer Neg Hx  Social History   Tobacco Use   Smoking status: Never   Smokeless tobacco: Never  Vaping Use   Vaping status: Never Used  Substance Use Topics   Alcohol use: No    Alcohol/week: 0.0 standard drinks of alcohol   Drug use: No    ROS   Objective:   Vitals: BP (P) 128/79 (BP Location: Right Arm)   Pulse (P) 98   Temp (P) 98.7 F (37.1 C) (Oral)   Resp (P) 20   LMP 04/05/2020   SpO2 (P) 100%   Physical Exam Constitutional:      General: She is not in acute distress.    Appearance: Normal appearance. She is well-developed and normal weight. She is not ill-appearing, toxic-appearing or diaphoretic.  HENT:     Head: Normocephalic and  atraumatic.     Right Ear: Tympanic membrane, ear canal and external ear normal. No drainage or tenderness. No middle ear effusion. There is no impacted cerumen. Tympanic membrane is not erythematous or bulging.     Left Ear: Tympanic membrane, ear canal and external ear normal. No drainage or tenderness.  No middle ear effusion. There is no impacted cerumen. Tympanic membrane is not erythematous or bulging.     Nose: Congestion present. No rhinorrhea.     Mouth/Throat:     Mouth: Mucous membranes are moist. No oral lesions.     Pharynx: No pharyngeal swelling, oropharyngeal exudate, posterior oropharyngeal erythema or uvula swelling.     Tonsils: No tonsillar exudate or tonsillar abscesses.     Comments: Significant postnasal drainage overlying pharynx with hoarseness noted. Eyes:     General: No scleral icterus.       Right eye: No discharge.        Left eye: No discharge.     Extraocular Movements: Extraocular movements intact.     Right eye: Normal extraocular motion.     Left eye: Normal extraocular motion.     Conjunctiva/sclera: Conjunctivae normal.  Cardiovascular:     Rate and Rhythm: Normal rate and regular rhythm.     Heart sounds: Normal heart sounds. No murmur heard.    No friction rub. No gallop.  Pulmonary:     Effort: Pulmonary effort is normal. No respiratory distress.     Breath sounds: No stridor. No wheezing, rhonchi or rales.  Chest:     Chest wall: No tenderness.  Musculoskeletal:     Cervical back: Normal range of motion and neck supple.  Lymphadenopathy:     Cervical: No cervical adenopathy.  Skin:    General: Skin is warm and dry.  Neurological:     General: No focal deficit present.     Mental Status: She is alert and oriented to person, place, and time.  Psychiatric:        Mood and Affect: Mood normal.        Behavior: Behavior normal.     Assessment and Plan :   PDMP not reviewed this encounter.  1. Viral respiratory infection   2. Laryngitis    3. Allergic rhinitis, unspecified seasonality, unspecified trigger   4. Moderate persistent asthma without complication    Start prednisone  for her laryngitis, significant respiratory symptoms within the context of significant allergic rhinitis and moderate persistent asthma.  Use supportive care otherwise.  Counseled patient on potential for adverse effects with medications prescribed/recommended today, ER and return-to-clinic precautions discussed, patient verbalized understanding.    Adolph Hoop, [place] 01/28/24 1535

## 2024-01-28 NOTE — ED Triage Notes (Signed)
 Pt c/o nasal congestion, laryngitis started 6/1-denies fever-NAD-steady gait

## 2024-02-01 DIAGNOSIS — J3081 Allergic rhinitis due to animal (cat) (dog) hair and dander: Secondary | ICD-10-CM | POA: Diagnosis not present

## 2024-02-01 DIAGNOSIS — J301 Allergic rhinitis due to pollen: Secondary | ICD-10-CM | POA: Diagnosis not present

## 2024-02-01 DIAGNOSIS — J3089 Other allergic rhinitis: Secondary | ICD-10-CM | POA: Diagnosis not present

## 2024-02-07 DIAGNOSIS — J301 Allergic rhinitis due to pollen: Secondary | ICD-10-CM | POA: Diagnosis not present

## 2024-02-07 DIAGNOSIS — H40023 Open angle with borderline findings, high risk, bilateral: Secondary | ICD-10-CM | POA: Diagnosis not present

## 2024-02-07 DIAGNOSIS — H16223 Keratoconjunctivitis sicca, not specified as Sjogren's, bilateral: Secondary | ICD-10-CM | POA: Diagnosis not present

## 2024-02-07 DIAGNOSIS — J3081 Allergic rhinitis due to animal (cat) (dog) hair and dander: Secondary | ICD-10-CM | POA: Diagnosis not present

## 2024-02-07 DIAGNOSIS — J3089 Other allergic rhinitis: Secondary | ICD-10-CM | POA: Diagnosis not present

## 2024-02-15 DIAGNOSIS — J3081 Allergic rhinitis due to animal (cat) (dog) hair and dander: Secondary | ICD-10-CM | POA: Diagnosis not present

## 2024-02-15 DIAGNOSIS — J3089 Other allergic rhinitis: Secondary | ICD-10-CM | POA: Diagnosis not present

## 2024-02-15 DIAGNOSIS — J301 Allergic rhinitis due to pollen: Secondary | ICD-10-CM | POA: Diagnosis not present

## 2024-02-22 DIAGNOSIS — J3089 Other allergic rhinitis: Secondary | ICD-10-CM | POA: Diagnosis not present

## 2024-02-22 DIAGNOSIS — J3081 Allergic rhinitis due to animal (cat) (dog) hair and dander: Secondary | ICD-10-CM | POA: Diagnosis not present

## 2024-02-22 DIAGNOSIS — J301 Allergic rhinitis due to pollen: Secondary | ICD-10-CM | POA: Diagnosis not present

## 2024-02-29 DIAGNOSIS — J301 Allergic rhinitis due to pollen: Secondary | ICD-10-CM | POA: Diagnosis not present

## 2024-02-29 DIAGNOSIS — J3081 Allergic rhinitis due to animal (cat) (dog) hair and dander: Secondary | ICD-10-CM | POA: Diagnosis not present

## 2024-02-29 DIAGNOSIS — J3089 Other allergic rhinitis: Secondary | ICD-10-CM | POA: Diagnosis not present

## 2024-03-08 DIAGNOSIS — J3081 Allergic rhinitis due to animal (cat) (dog) hair and dander: Secondary | ICD-10-CM | POA: Diagnosis not present

## 2024-03-08 DIAGNOSIS — J3089 Other allergic rhinitis: Secondary | ICD-10-CM | POA: Diagnosis not present

## 2024-03-08 DIAGNOSIS — J301 Allergic rhinitis due to pollen: Secondary | ICD-10-CM | POA: Diagnosis not present

## 2024-03-13 DIAGNOSIS — J3081 Allergic rhinitis due to animal (cat) (dog) hair and dander: Secondary | ICD-10-CM | POA: Diagnosis not present

## 2024-03-13 DIAGNOSIS — J301 Allergic rhinitis due to pollen: Secondary | ICD-10-CM | POA: Diagnosis not present

## 2024-03-13 DIAGNOSIS — J3089 Other allergic rhinitis: Secondary | ICD-10-CM | POA: Diagnosis not present

## 2024-03-21 DIAGNOSIS — J3089 Other allergic rhinitis: Secondary | ICD-10-CM | POA: Diagnosis not present

## 2024-03-21 DIAGNOSIS — J301 Allergic rhinitis due to pollen: Secondary | ICD-10-CM | POA: Diagnosis not present

## 2024-03-21 DIAGNOSIS — J3081 Allergic rhinitis due to animal (cat) (dog) hair and dander: Secondary | ICD-10-CM | POA: Diagnosis not present

## 2024-03-30 DIAGNOSIS — J3089 Other allergic rhinitis: Secondary | ICD-10-CM | POA: Diagnosis not present

## 2024-03-30 DIAGNOSIS — J3081 Allergic rhinitis due to animal (cat) (dog) hair and dander: Secondary | ICD-10-CM | POA: Diagnosis not present

## 2024-03-30 DIAGNOSIS — J301 Allergic rhinitis due to pollen: Secondary | ICD-10-CM | POA: Diagnosis not present

## 2024-04-06 DIAGNOSIS — J301 Allergic rhinitis due to pollen: Secondary | ICD-10-CM | POA: Diagnosis not present

## 2024-04-06 DIAGNOSIS — J3089 Other allergic rhinitis: Secondary | ICD-10-CM | POA: Diagnosis not present

## 2024-04-13 DIAGNOSIS — J4540 Moderate persistent asthma, uncomplicated: Secondary | ICD-10-CM | POA: Diagnosis not present

## 2024-04-13 DIAGNOSIS — J019 Acute sinusitis, unspecified: Secondary | ICD-10-CM | POA: Diagnosis not present

## 2024-04-13 DIAGNOSIS — J301 Allergic rhinitis due to pollen: Secondary | ICD-10-CM | POA: Diagnosis not present

## 2024-04-13 DIAGNOSIS — J4541 Moderate persistent asthma with (acute) exacerbation: Secondary | ICD-10-CM | POA: Diagnosis not present

## 2024-04-14 ENCOUNTER — Other Ambulatory Visit: Payer: Self-pay | Admitting: Internal Medicine

## 2024-04-14 DIAGNOSIS — E785 Hyperlipidemia, unspecified: Secondary | ICD-10-CM

## 2024-04-14 DIAGNOSIS — I10 Essential (primary) hypertension: Secondary | ICD-10-CM

## 2024-04-21 DIAGNOSIS — J301 Allergic rhinitis due to pollen: Secondary | ICD-10-CM | POA: Diagnosis not present

## 2024-04-21 DIAGNOSIS — J3089 Other allergic rhinitis: Secondary | ICD-10-CM | POA: Diagnosis not present

## 2024-04-21 DIAGNOSIS — J3081 Allergic rhinitis due to animal (cat) (dog) hair and dander: Secondary | ICD-10-CM | POA: Diagnosis not present

## 2024-04-27 DIAGNOSIS — J301 Allergic rhinitis due to pollen: Secondary | ICD-10-CM | POA: Diagnosis not present

## 2024-04-27 DIAGNOSIS — J3089 Other allergic rhinitis: Secondary | ICD-10-CM | POA: Diagnosis not present

## 2024-04-27 DIAGNOSIS — J3081 Allergic rhinitis due to animal (cat) (dog) hair and dander: Secondary | ICD-10-CM | POA: Diagnosis not present

## 2024-05-01 ENCOUNTER — Other Ambulatory Visit: Payer: Self-pay | Admitting: Nurse Practitioner

## 2024-05-01 DIAGNOSIS — B372 Candidiasis of skin and nail: Secondary | ICD-10-CM

## 2024-05-02 DIAGNOSIS — J301 Allergic rhinitis due to pollen: Secondary | ICD-10-CM | POA: Diagnosis not present

## 2024-05-02 DIAGNOSIS — J3081 Allergic rhinitis due to animal (cat) (dog) hair and dander: Secondary | ICD-10-CM | POA: Diagnosis not present

## 2024-05-02 DIAGNOSIS — J3089 Other allergic rhinitis: Secondary | ICD-10-CM | POA: Diagnosis not present

## 2024-05-02 NOTE — Telephone Encounter (Signed)
 Med refill request: Nystatien-triamcinolone  ointment (Mycolog)  Start date: 09/14/23 - #30 g with 1 refill Last AEX: 09/14/23 Next AEX: 09/26/24 Last MMG (if hormonal med): 2017 Refill authorized? Please Advise.

## 2024-05-08 DIAGNOSIS — J3089 Other allergic rhinitis: Secondary | ICD-10-CM | POA: Diagnosis not present

## 2024-05-08 DIAGNOSIS — J3081 Allergic rhinitis due to animal (cat) (dog) hair and dander: Secondary | ICD-10-CM | POA: Diagnosis not present

## 2024-05-08 DIAGNOSIS — J301 Allergic rhinitis due to pollen: Secondary | ICD-10-CM | POA: Diagnosis not present

## 2024-05-15 DIAGNOSIS — J3089 Other allergic rhinitis: Secondary | ICD-10-CM | POA: Diagnosis not present

## 2024-05-15 DIAGNOSIS — J4540 Moderate persistent asthma, uncomplicated: Secondary | ICD-10-CM | POA: Diagnosis not present

## 2024-05-15 DIAGNOSIS — H1045 Other chronic allergic conjunctivitis: Secondary | ICD-10-CM | POA: Diagnosis not present

## 2024-05-15 DIAGNOSIS — J3081 Allergic rhinitis due to animal (cat) (dog) hair and dander: Secondary | ICD-10-CM | POA: Diagnosis not present

## 2024-05-15 DIAGNOSIS — J301 Allergic rhinitis due to pollen: Secondary | ICD-10-CM | POA: Diagnosis not present

## 2024-06-01 DIAGNOSIS — J3089 Other allergic rhinitis: Secondary | ICD-10-CM | POA: Diagnosis not present

## 2024-06-01 DIAGNOSIS — J301 Allergic rhinitis due to pollen: Secondary | ICD-10-CM | POA: Diagnosis not present

## 2024-06-07 DIAGNOSIS — J3089 Other allergic rhinitis: Secondary | ICD-10-CM | POA: Diagnosis not present

## 2024-06-07 DIAGNOSIS — J301 Allergic rhinitis due to pollen: Secondary | ICD-10-CM | POA: Diagnosis not present

## 2024-06-14 DIAGNOSIS — J301 Allergic rhinitis due to pollen: Secondary | ICD-10-CM | POA: Diagnosis not present

## 2024-06-14 DIAGNOSIS — J3089 Other allergic rhinitis: Secondary | ICD-10-CM | POA: Diagnosis not present

## 2024-06-23 DIAGNOSIS — J301 Allergic rhinitis due to pollen: Secondary | ICD-10-CM | POA: Diagnosis not present

## 2024-06-23 DIAGNOSIS — J3089 Other allergic rhinitis: Secondary | ICD-10-CM | POA: Diagnosis not present

## 2024-07-05 ENCOUNTER — Encounter: Payer: Self-pay | Admitting: Radiology

## 2024-07-05 ENCOUNTER — Ambulatory Visit: Admitting: Radiology

## 2024-07-05 VITALS — BP 122/86 | Wt 260.0 lb

## 2024-07-05 DIAGNOSIS — B372 Candidiasis of skin and nail: Secondary | ICD-10-CM | POA: Diagnosis not present

## 2024-07-05 DIAGNOSIS — N760 Acute vaginitis: Secondary | ICD-10-CM | POA: Diagnosis not present

## 2024-07-05 LAB — WET PREP FOR TRICH, YEAST, CLUE

## 2024-07-05 MED ORDER — FLUCONAZOLE 150 MG PO TABS: 150.0000 mg | ORAL_TABLET | ORAL | 0 refills | Status: DC

## 2024-07-05 MED ORDER — NYSTATIN-TRIAMCINOLONE 100000-0.1 UNIT/GM-% EX OINT: 1.0000 | TOPICAL_OINTMENT | Freq: Two times a day (BID) | CUTANEOUS | 0 refills | Status: DC

## 2024-07-05 NOTE — Progress Notes (Signed)
      Subjective: Catherine Campbell is a 58 y.o. female who complains of vaginal itching, odor since taking antibiotics 03/2024 (OTC monistat minimal relief).    Review of Systems  All other systems reviewed and are negative.   Past Medical History:  Diagnosis Date   Allergy    ASCUS with positive high risk human papillomavirus of vagina 06/2013, 12/2016   Colposcopic biopsy koilocytotic atypia.   Asthma    Hypertension    Low grade squamous intraepithelial lesion (LGSIL) 06/2014, 06/2015, 06/2016, 06/2017, 12/2017, 07/2018   positive high-risk HPV. Colposcopic biopsy LGSIL negative ECC 2015,  ECC 2017 with koilocytotic atypia changes      Objective:  Today's Vitals   07/05/24 1512  BP: 122/86  Weight: 260 lb (117.9 kg)   Body mass index is 43.27 kg/m.   Physical Exam Vitals and nursing note reviewed. Exam conducted with a chaperone present.  Constitutional:      Appearance: Normal appearance. She is well-developed.  Pulmonary:     Effort: Pulmonary effort is normal.  Abdominal:     General: Abdomen is flat.     Palpations: Abdomen is soft.  Genitourinary:    General: Normal vulva.     Pubic Area: Rash present.     Labia:        Right: Rash present.        Left: Rash present.      Vagina: Vaginal discharge and erythema present. No bleeding or lesions.     Cervix: Normal. No discharge, friability, lesion or erythema.     Uterus: Normal.      Adnexa: Right adnexa normal and left adnexa normal.  Neurological:     Mental Status: She is alert.  Psychiatric:        Mood and Affect: Mood normal.        Thought Content: Thought content normal.        Judgment: Judgment normal.    Microscopic wet-mount exam shows negative for pathogens, normal epithelial cells.   Darice Hoit, CMA present for exam  Assessment:/Plan:   1. Vulvovaginitis (Primary) - WET PREP FOR TRICH, YEAST, CLUE - nystatin -triamcinolone  ointment (MYCOLOG); Apply 1 Application topically 2 (two)  times daily.  Dispense: 30 g; Refill: 0     Avoid intercourse until symptoms are resolved. Safe sex encouraged. Avoid the use of soaps or perfumed products in the peri area. Avoid tub baths and sitting in sweaty or wet clothing for prolonged periods of time.    Bolden Hagerman B, NP 3:20 PM

## 2024-07-06 DIAGNOSIS — J3081 Allergic rhinitis due to animal (cat) (dog) hair and dander: Secondary | ICD-10-CM | POA: Diagnosis not present

## 2024-07-06 DIAGNOSIS — J3089 Other allergic rhinitis: Secondary | ICD-10-CM | POA: Diagnosis not present

## 2024-07-06 DIAGNOSIS — J301 Allergic rhinitis due to pollen: Secondary | ICD-10-CM | POA: Diagnosis not present

## 2024-07-17 DIAGNOSIS — J3089 Other allergic rhinitis: Secondary | ICD-10-CM | POA: Diagnosis not present

## 2024-07-17 DIAGNOSIS — J301 Allergic rhinitis due to pollen: Secondary | ICD-10-CM | POA: Diagnosis not present

## 2024-07-17 DIAGNOSIS — J3081 Allergic rhinitis due to animal (cat) (dog) hair and dander: Secondary | ICD-10-CM | POA: Diagnosis not present

## 2024-08-03 DIAGNOSIS — J301 Allergic rhinitis due to pollen: Secondary | ICD-10-CM | POA: Diagnosis not present

## 2024-08-03 DIAGNOSIS — J3089 Other allergic rhinitis: Secondary | ICD-10-CM | POA: Diagnosis not present

## 2024-08-09 DIAGNOSIS — J301 Allergic rhinitis due to pollen: Secondary | ICD-10-CM | POA: Diagnosis not present

## 2024-08-09 DIAGNOSIS — J3089 Other allergic rhinitis: Secondary | ICD-10-CM | POA: Diagnosis not present

## 2024-08-09 DIAGNOSIS — J3081 Allergic rhinitis due to animal (cat) (dog) hair and dander: Secondary | ICD-10-CM | POA: Diagnosis not present

## 2024-09-25 ENCOUNTER — Ambulatory Visit: Admitting: Nurse Practitioner

## 2024-09-26 ENCOUNTER — Encounter: Payer: Self-pay | Admitting: Nurse Practitioner

## 2024-09-26 ENCOUNTER — Ambulatory Visit: Admitting: Nurse Practitioner

## 2024-09-26 VITALS — BP 118/72 | HR 68 | Resp 16 | Ht 64.25 in | Wt 257.0 lb

## 2024-09-26 DIAGNOSIS — Z1331 Encounter for screening for depression: Secondary | ICD-10-CM

## 2024-09-26 DIAGNOSIS — Z780 Asymptomatic menopausal state: Secondary | ICD-10-CM

## 2024-09-26 DIAGNOSIS — Z01419 Encounter for gynecological examination (general) (routine) without abnormal findings: Secondary | ICD-10-CM

## 2024-09-26 DIAGNOSIS — Z124 Encounter for screening for malignant neoplasm of cervix: Secondary | ICD-10-CM

## 2024-09-28 ENCOUNTER — Ambulatory Visit: Payer: Self-pay | Admitting: Nurse Practitioner

## 2024-09-28 LAB — CYTOLOGY - PAP
Comment: NEGATIVE
Diagnosis: NEGATIVE
High risk HPV: NEGATIVE

## 2024-09-29 NOTE — Progress Notes (Signed)
 Pt reviewed results and have been made aware that she was placed on a recall list.
